# Patient Record
Sex: Male | Born: 1960 | Race: White | Hispanic: No | Marital: Married | Smoking: Never smoker
Health system: Southern US, Community
[De-identification: ages and names within clinical notes are randomized; demographics above are authoritative.]

## PROBLEM LIST (undated history)

## (undated) DIAGNOSIS — N289 Disorder of kidney and ureter, unspecified: Secondary | ICD-10-CM

## (undated) DIAGNOSIS — I519 Heart disease, unspecified: Secondary | ICD-10-CM

## (undated) DIAGNOSIS — I4891 Unspecified atrial fibrillation: Secondary | ICD-10-CM

## (undated) DIAGNOSIS — L0390 Cellulitis, unspecified: Secondary | ICD-10-CM

## (undated) DIAGNOSIS — D649 Anemia, unspecified: Secondary | ICD-10-CM

## (undated) DIAGNOSIS — I5042 Chronic combined systolic (congestive) and diastolic (congestive) heart failure: Secondary | ICD-10-CM

## (undated) DIAGNOSIS — I2510 Atherosclerotic heart disease of native coronary artery without angina pectoris: Secondary | ICD-10-CM

## (undated) DIAGNOSIS — R601 Generalized edema: Secondary | ICD-10-CM

## (undated) DIAGNOSIS — I10 Essential (primary) hypertension: Secondary | ICD-10-CM

## (undated) DIAGNOSIS — E119 Type 2 diabetes mellitus without complications: Secondary | ICD-10-CM

## (undated) HISTORY — PX: FOOT SURGERY: SHX648

## (undated) HISTORY — PX: CATARACT EXTRACTION W/ INTRAOCULAR LENS  IMPLANT, BILATERAL: SHX1307

---

## 2012-03-30 ENCOUNTER — Encounter (HOSPITAL_COMMUNITY): Payer: Self-pay | Admitting: Emergency Medicine

## 2012-03-30 ENCOUNTER — Emergency Department (HOSPITAL_COMMUNITY): Payer: Self-pay

## 2012-03-30 ENCOUNTER — Inpatient Hospital Stay (HOSPITAL_COMMUNITY)
Admission: EM | Admit: 2012-03-30 | Discharge: 2012-04-11 | DRG: 603 | Disposition: A | Discharge status: Home / Self Care | Payer: MEDICAID | Attending: Internal Medicine | Admitting: Internal Medicine

## 2012-03-30 DIAGNOSIS — L03119 Cellulitis of unspecified part of limb: Secondary | ICD-10-CM | POA: Diagnosis present

## 2012-03-30 DIAGNOSIS — E119 Type 2 diabetes mellitus without complications: Secondary | ICD-10-CM | POA: Diagnosis present

## 2012-03-30 DIAGNOSIS — Z79899 Other long term (current) drug therapy: Secondary | ICD-10-CM

## 2012-03-30 DIAGNOSIS — D649 Anemia, unspecified: Secondary | ICD-10-CM

## 2012-03-30 DIAGNOSIS — L02611 Cutaneous abscess of right foot: Secondary | ICD-10-CM

## 2012-03-30 DIAGNOSIS — I10 Essential (primary) hypertension: Secondary | ICD-10-CM | POA: Diagnosis present

## 2012-03-30 DIAGNOSIS — L02619 Cutaneous abscess of unspecified foot: Principal | ICD-10-CM | POA: Diagnosis present

## 2012-03-30 DIAGNOSIS — D72829 Elevated white blood cell count, unspecified: Secondary | ICD-10-CM | POA: Diagnosis present

## 2012-03-30 DIAGNOSIS — A4902 Methicillin resistant Staphylococcus aureus infection, unspecified site: Secondary | ICD-10-CM | POA: Diagnosis present

## 2012-03-30 DIAGNOSIS — D638 Anemia in other chronic diseases classified elsewhere: Secondary | ICD-10-CM

## 2012-03-30 DIAGNOSIS — E669 Obesity, unspecified: Secondary | ICD-10-CM | POA: Diagnosis present

## 2012-03-30 LAB — CBC WITH DIFFERENTIAL/PLATELET
Basophils Absolute: 0 10*3/uL (ref 0.0–0.1)
Eosinophils Absolute: 0.1 10*3/uL (ref 0.0–0.7)
Lymphocytes Relative: 7 % — ABNORMAL LOW (ref 12–46)
Lymphs Abs: 1 10*3/uL (ref 0.7–4.0)
MCH: 27.9 pg (ref 26.0–34.0)
Neutrophils Relative %: 87 % — ABNORMAL HIGH (ref 43–77)
Platelets: 294 10*3/uL (ref 150–400)
RBC: 3.9 MIL/uL — ABNORMAL LOW (ref 4.22–5.81)
RDW: 12.6 % (ref 11.5–15.5)
WBC: 15.1 10*3/uL — ABNORMAL HIGH (ref 4.0–10.5)

## 2012-03-30 LAB — COMPREHENSIVE METABOLIC PANEL WITH GFR
ALT: 18 U/L (ref 0–53)
AST: 11 U/L (ref 0–37)
Albumin: 2.2 g/dL — ABNORMAL LOW (ref 3.5–5.2)
Alkaline Phosphatase: 164 U/L — ABNORMAL HIGH (ref 39–117)
BUN: 13 mg/dL (ref 6–23)
CO2: 23 meq/L (ref 19–32)
Calcium: 9.5 mg/dL (ref 8.4–10.5)
Chloride: 96 meq/L (ref 96–112)
Creatinine, Ser: 0.84 mg/dL (ref 0.50–1.35)
GFR calc Af Amer: >90 mL/min
GFR calc non Af Amer: >90 mL/min
Glucose, Bld: 324 mg/dL — ABNORMAL HIGH (ref 70–99)
Potassium: 3.8 meq/L (ref 3.5–5.1)
Sodium: 133 meq/L — ABNORMAL LOW (ref 135–145)
Total Bilirubin: 0.2 mg/dL — ABNORMAL LOW (ref 0.3–1.2)
Total Protein: 7.8 g/dL (ref 6.0–8.3)

## 2012-03-30 LAB — GLUCOSE, CAPILLARY
Glucose-Capillary: 234 mg/dL — ABNORMAL HIGH (ref 70–99)
Glucose-Capillary: 202 mg/dL — ABNORMAL HIGH (ref 70–99)

## 2012-03-30 LAB — RETICULOCYTES: RBC.: 3.84 MIL/uL — ABNORMAL LOW (ref 4.22–5.81)

## 2012-03-30 MED ORDER — SODIUM CHLORIDE 0.9 % IV SOLN
INTRAVENOUS | Status: DC
  Administered 2012-03-30: 15:00:00 via INTRAVENOUS

## 2012-03-30 MED ORDER — ACETAMINOPHEN 325 MG PO TABS: 650.0000 mg | ORAL_TABLET | Freq: Four times a day (QID) | ORAL | Status: DC | PRN

## 2012-03-30 MED ORDER — PIPERACILLIN-TAZOBACTAM 3.375 G IVPB
3.3750 g | Freq: Three times a day (TID) | INTRAVENOUS | Status: DC
  Administered 2012-03-30 – 2012-04-01 (×5): 3.375 g via INTRAVENOUS
  Filled 2012-03-30 (×6): qty 50

## 2012-03-30 MED ORDER — ACETAMINOPHEN 650 MG RE SUPP: 650.0000 mg | Freq: Four times a day (QID) | RECTAL | Status: DC | PRN

## 2012-03-30 MED ORDER — PIPERACILLIN-TAZOBACTAM 3.375 G IVPB
3.3750 g | Freq: Once | INTRAVENOUS | Status: AC
  Administered 2012-03-30: 3.375 g via INTRAVENOUS
  Filled 2012-03-30: qty 50

## 2012-03-30 MED ORDER — ONDANSETRON HCL 4 MG/2ML IJ SOLN: 4.0000 mg | Freq: Four times a day (QID) | INTRAMUSCULAR | Status: DC | PRN

## 2012-03-30 MED ORDER — ENOXAPARIN SODIUM 40 MG/0.4ML ~~LOC~~ SOLN
40.0000 mg | SUBCUTANEOUS | Status: DC
  Administered 2012-03-30: 40 mg via SUBCUTANEOUS
  Filled 2012-03-30: qty 0.4

## 2012-03-30 MED ORDER — SODIUM CHLORIDE 0.9 % IV SOLN
Freq: Once | INTRAVENOUS | Status: AC
  Administered 2012-03-30: 12:00:00 via INTRAVENOUS

## 2012-03-30 MED ORDER — SODIUM CHLORIDE 0.9 % IV BOLUS (SEPSIS)
500.0000 mL | INTRAVENOUS | Status: AC
  Administered 2012-03-30: 1000 mL via INTRAVENOUS

## 2012-03-30 MED ORDER — INSULIN ASPART 100 UNIT/ML ~~LOC~~ SOLN
4.0000 [IU] | Freq: Once | SUBCUTANEOUS | Status: AC
  Administered 2012-03-30: 4 [IU] via INTRAVENOUS
  Filled 2012-03-30: qty 1

## 2012-03-30 MED ORDER — TETANUS-DIPHTH-ACELL PERTUSSIS 5-2.5-18.5 LF-MCG/0.5 IM SUSP
0.5000 mL | Freq: Once | INTRAMUSCULAR | Status: AC
  Administered 2012-03-30: 0.5 mL via INTRAMUSCULAR
  Filled 2012-03-30: qty 0.5

## 2012-03-30 MED ORDER — INSULIN ASPART 100 UNIT/ML ~~LOC~~ SOLN
0.0000 [IU] | Freq: Three times a day (TID) | SUBCUTANEOUS | Status: DC
  Administered 2012-03-31: 3 [IU] via SUBCUTANEOUS
  Administered 2012-03-31: 8 [IU] via SUBCUTANEOUS
  Administered 2012-03-31: 2 [IU] via SUBCUTANEOUS
  Administered 2012-04-01: 3 [IU] via SUBCUTANEOUS
  Administered 2012-04-01: 5 [IU] via SUBCUTANEOUS
  Administered 2012-04-01 – 2012-04-04 (×7): 3 [IU] via SUBCUTANEOUS
  Administered 2012-04-04: 2 [IU] via SUBCUTANEOUS
  Administered 2012-04-04: 3 [IU] via SUBCUTANEOUS
  Administered 2012-04-05: 2 [IU] via SUBCUTANEOUS
  Administered 2012-04-05: 3 [IU] via SUBCUTANEOUS
  Administered 2012-04-06 – 2012-04-11 (×10): 2 [IU] via SUBCUTANEOUS

## 2012-03-30 MED ORDER — HYDROCODONE-ACETAMINOPHEN 5-325 MG PO TABS: 1.0000 | ORAL_TABLET | ORAL | Status: DC | PRN

## 2012-03-30 MED ORDER — INSULIN ASPART 100 UNIT/ML ~~LOC~~ SOLN
0.0000 [IU] | Freq: Every day | SUBCUTANEOUS | Status: DC
  Administered 2012-03-30 – 2012-04-03 (×2): 2 [IU] via SUBCUTANEOUS

## 2012-03-30 MED ORDER — ONDANSETRON HCL 4 MG PO TABS: 4.0000 mg | ORAL_TABLET | Freq: Four times a day (QID) | ORAL | Status: DC | PRN

## 2012-03-30 MED ORDER — SODIUM CHLORIDE 0.9 % IV BOLUS (SEPSIS): 1000.0000 mL | Freq: Once | INTRAVENOUS | Status: DC

## 2012-03-30 MED ORDER — VANCOMYCIN HCL IN DEXTROSE 1-5 GM/200ML-% IV SOLN
INTRAVENOUS | Status: AC
  Filled 2012-03-30: qty 200

## 2012-03-30 MED ORDER — HYDROCODONE-ACETAMINOPHEN 5-325 MG PO TABS
2.0000 | ORAL_TABLET | Freq: Once | ORAL | Status: AC
  Administered 2012-03-30: 2 via ORAL
  Filled 2012-03-30: qty 2

## 2012-03-30 MED ORDER — METOPROLOL TARTRATE 25 MG PO TABS
25.0000 mg | ORAL_TABLET | Freq: Two times a day (BID) | ORAL | Status: DC
  Administered 2012-03-30 – 2012-03-31 (×3): 25 mg via ORAL
  Filled 2012-03-30 (×3): qty 1

## 2012-03-30 MED ORDER — VANCOMYCIN HCL IN DEXTROSE 1-5 GM/200ML-% IV SOLN
1000.0000 mg | Freq: Once | INTRAVENOUS | Status: AC
  Administered 2012-03-30: 1000 mg via INTRAVENOUS
  Filled 2012-03-30: qty 200

## 2012-03-30 MED ORDER — HYDROCODONE-ACETAMINOPHEN 5-325 MG PO TABS
1.0000 | ORAL_TABLET | ORAL | Status: DC | PRN
  Administered 2012-03-30 – 2012-04-04 (×21): 2 via ORAL
  Administered 2012-04-04 – 2012-04-05 (×3): 1 via ORAL
  Administered 2012-04-05 (×2): 2 via ORAL
  Administered 2012-04-05 – 2012-04-06 (×2): 1 via ORAL
  Administered 2012-04-06 (×2): 2 via ORAL
  Administered 2012-04-07 – 2012-04-08 (×4): 1 via ORAL
  Administered 2012-04-09: 2 via ORAL
  Administered 2012-04-09: 1 via ORAL
  Administered 2012-04-09 – 2012-04-11 (×6): 2 via ORAL
  Filled 2012-03-30 (×2): qty 2
  Filled 2012-03-30: qty 1
  Filled 2012-03-30 (×6): qty 2
  Filled 2012-03-30: qty 1
  Filled 2012-03-30 (×8): qty 2
  Filled 2012-03-30: qty 1
  Filled 2012-03-30: qty 2
  Filled 2012-03-30: qty 1
  Filled 2012-03-30: qty 2
  Filled 2012-03-30: qty 1
  Filled 2012-03-30 (×7): qty 2
  Filled 2012-03-30: qty 1
  Filled 2012-03-30: qty 2
  Filled 2012-03-30: qty 1
  Filled 2012-03-30 (×4): qty 2
  Filled 2012-03-30 (×2): qty 1
  Filled 2012-03-30 (×3): qty 2

## 2012-03-30 MED ORDER — SODIUM CHLORIDE 0.9 % IV SOLN
INTRAVENOUS | Status: DC
  Administered 2012-03-31 – 2012-04-10 (×6): via INTRAVENOUS

## 2012-03-30 MED ORDER — VANCOMYCIN HCL IN DEXTROSE 1-5 GM/200ML-% IV SOLN
1000.0000 mg | Freq: Two times a day (BID) | INTRAVENOUS | Status: DC
  Administered 2012-03-30 – 2012-03-31 (×3): 1000 mg via INTRAVENOUS
  Filled 2012-03-30 (×4): qty 200

## 2012-03-30 MED ORDER — PIPERACILLIN-TAZOBACTAM 3.375 G IVPB
INTRAVENOUS | Status: AC
  Filled 2012-03-30: qty 100

## 2012-03-30 MED ORDER — MORPHINE SULFATE 4 MG/ML IJ SOLN
4.0000 mg | Freq: Once | INTRAMUSCULAR | Status: DC
  Filled 2012-03-30: qty 1

## 2012-03-30 NOTE — ED Notes (Signed)
Pt with wound to bottom of right foot, pt states that started out what looked like a bruise 2 weeks ago, states that never healed and got worse, was seen at Urgent Care the other day and had an I&D done and started on antibiotics

## 2012-03-30 NOTE — ED Notes (Signed)
Patient transported to X-ray 

## 2012-03-30 NOTE — ED Provider Notes (Signed)
History     CSN: [id number]  Arrival date & time 03/30/12  1041   First MD Initiated Contact with Patient 03/30/12 1053      Chief Complaint  Patient presents with  . Wound Infection    (Consider location/radiation/quality/duration/timing/severity/associated sxs/prior treatment) HPI  Patient reports about 2 weeks ago he had some minor discomfort on the sole of his right foot. He denied any known injury stating he even looked at the soles of his shoes to make sure there were no puncture wounds. He states it felt like he had a bruise on the bottom of his foot. About a week ago he started getting swelling of the right foot and the area on his bottom of his foot got bigger. He reports his foot started feeling tight and he was not walking on it. He was seen at an urgent care 2 days ago where he had a temperature of 99 and it was felt he had a abscess on the sole of his foot. It was lanced and they report a lot of drainage occurred. He was started on Septra antibiotic. He relates swelling is improved a little bit since 2 days ago. He was sent to Dr. Brooke Bonito office today for evaluation and he sent to the ER. Dr. Luna Glasgow evidently is leaving town tomorrow and states he could not take care of the patient.  Patient denies known fevers or chills. Patient states he likes to go rafting but does not think he had any injury while rafting.  PCP none  History reviewed. No pertinent past medical history.  Has not seen a doctor in 20 years  History reviewed. No pertinent past surgical history.  History reviewed. No pertinent family history. MOP diabetes in 70's  History  Substance Use Topics  . Smoking status: Never Smoker   . Smokeless tobacco: Not on file  . Alcohol Use: No   lives with spouse Self-employed repairing string instruments   last tetanus unknown  Review of Systems  All other systems reviewed and are negative.    Allergies  Review of patient's allergies indicates no known  allergies.  Home Medications   Current Outpatient Rx  Name Route Sig Dispense Refill  . IBUPROFEN 200 MG PO TABS Oral Take 400 mg by mouth every 4 (four) hours as needed.    . SULFAMETHOXAZOLE-TMP DS 800-160 MG PO TABS Oral Take 2 tablets by mouth 2 (two) times daily.      BP 179/90  Pulse 122  Temp 98 F (36.7 C) (Oral)  Resp 18  Ht 5\' 11"  (1.803 m)  Wt 250 lb (113.399 kg)  BMI 34.87 kg/m2  SpO2 96%  Vital signs normal except tachycardia   Physical Exam  Constitutional: He is oriented to person, place, and time. He appears well-developed and well-nourished.  Non-toxic appearance. He does not appear ill. No distress.  HENT:  Head: Normocephalic and atraumatic.  Right Ear: External ear normal.  Left Ear: External ear normal.  Nose: Nose normal. No mucosal edema or rhinorrhea.  Mouth/Throat: Oropharynx is clear and moist and mucous membranes are normal. No dental abscesses or uvula swelling.  Eyes: Conjunctivae and EOM are normal. Pupils are equal, round, and reactive to light.  Neck: Normal range of motion and full passive range of motion without pain. Neck supple.  Cardiovascular: Regular rhythm and normal heart sounds.  Tachycardia present.  Exam reveals no gallop and no friction rub.   No murmur heard. Pulmonary/Chest: Effort normal and breath sounds normal. No  respiratory distress. He has no wheezes. He has no rhonchi. He has no rales. He exhibits no tenderness and no crepitus.  Abdominal: Soft. Normal appearance and bowel sounds are normal. He exhibits no distension. There is no tenderness. There is no rebound and no guarding.  Musculoskeletal: Normal range of motion. He exhibits no edema and no tenderness.       Right foot: He exhibits swelling.       Moves all extremities well.   Patient has diffuse swelling of the dorsum of his right foot extending to the ankles bilaterally. He has some redness/bruising on the lateral aspect of his foot bilaterally beneath the  malleoli. He's noted to have a an incision on the sole of his right foot just proximal to the MTP area that when I press he has bloody/purulent drainage from. He has a large 5 x 8 purplish swollen area surrounding the incision with a rim of white underneath the skin surrounding it about 3/4 cm in size. The purple area area is bulging.  Neurological: He is alert and oriented to person, place, and time. He has normal strength. No cranial nerve deficit.  Skin: Skin is warm, dry and intact. No rash noted. No erythema. No pallor.  Psychiatric: He has a normal mood and affect. His speech is normal and behavior is normal. His mood appears not anxious.    ED Course  Procedures (including critical care time)   Medications  sodium chloride 0.9 % bolus 1,000 mL (not administered)  morphine 4 MG/ML injection 4 mg (4 mg Intravenous Not Given 03/30/12 1341)  TDaP (BOOSTRIX) injection 0.5 mL (0.5 mL Intramuscular Given 03/30/12 1132)  0.9 %  sodium chloride infusion (  Intravenous Bolus from Bag 03/30/12 1141)  sodium chloride 0.9 % bolus 500 mL (1000 mL Intravenous Given 03/30/12 1130)  vancomycin (VANCOCIN) IVPB 1000 mg/200 mL premix (1000 mg Intravenous Given 03/30/12 1212)  piperacillin-tazobactam (ZOSYN) IVPB 3.375 g (3.375 g Intravenous Given 03/30/12 1140)  HYDROcodone-acetaminophen (NORCO) 5-325 MG per tablet 2 tablet (2 tablet Oral Given 03/30/12 1147)  insulin aspart (novoLOG) injection 4 Units (4 Units Intravenous Given 03/30/12 1332)   Wound culture obtained by me during exam  13:38 Dr Roderic Palau admit to team 2 med-surg  Results for orders placed during the hospital encounter of 03/30/12  CBC WITH DIFFERENTIAL      Component Value Range   WBC 15.1 (*) 4.0 - 10.5 K/uL   RBC 3.90 (*) 4.22 - 5.81 MIL/uL   Hemoglobin 10.9 (*) 13.0 - 17.0 g/dL   HCT 32.4 (*) 39.0 - 52.0 %   MCV 83.1  78.0 - 100.0 fL   MCH 27.9  26.0 - 34.0 pg   MCHC 33.6  30.0 - 36.0 g/dL   RDW 12.6  11.5 - 15.5 %   Platelets 294  150 - 400  K/uL   Neutrophils Relative 87 (*) 43 - 77 %   Neutro Abs 13.1 (*) 1.7 - 7.7 K/uL   Lymphocytes Relative 7 (*) 12 - 46 %   Lymphs Abs 1.0  0.7 - 4.0 K/uL   Monocytes Relative 6  3 - 12 %   Monocytes Absolute 0.9  0.1 - 1.0 K/uL   Eosinophils Relative 0  0 - 5 %   Eosinophils Absolute 0.1  0.0 - 0.7 K/uL   Basophils Relative 0  0 - 1 %   Basophils Absolute 0.0  0.0 - 0.1 K/uL  COMPREHENSIVE METABOLIC PANEL      Component  Value Range   Sodium 133 (*) 135 - 145 mEq/L   Potassium 3.8  3.5 - 5.1 mEq/L   Chloride 96  96 - 112 mEq/L   CO2 23  19 - 32 mEq/L   Glucose, Bld 324 (*) 70 - 99 mg/dL   BUN 13  6 - 23 mg/dL   Creatinine, Ser 0.84  0.50 - 1.35 mg/dL   Calcium 9.5  8.4 - 10.5 mg/dL   Total Protein 7.8  6.0 - 8.3 g/dL   Albumin 2.2 (*) 3.5 - 5.2 g/dL   AST 11  0 - 37 U/L   ALT 18  0 - 53 U/L   Alkaline Phosphatase 164 (*) 39 - 117 U/L   Total Bilirubin 0.2 (*) 0.3 - 1.2 mg/dL   GFR calc non Af Amer >90  >90 mL/min   GFR calc Af Amer >90  >90 mL/min  GLUCOSE, CAPILLARY      Component Value Range   Glucose-Capillary 234 (*) 70 - 99 mg/dL   Laboratory interpretation all normal except leukocytosis, anemia, hyperglycemia    Dg Foot Complete Right  03/30/2012  *RADIOLOGY REPORT*  Clinical Data: Large abscess on this fold of foot.  RIGHT FOOT COMPLETE - 3+ VIEW  Comparison: No priors.  Findings: Three views of the right foot demonstrate extensive soft tissue swelling in the plantar aspect of the foot beneath the calcaneus.  There is also generalized soft tissue swelling.  No retained radiopaque foreign bodies are identified within the soft tissues.  No acute fracture, subluxation or dislocation.  No definite areas of bony lysis are noted.  IMPRESSION: 1.  Soft tissue swelling of the foot as above, without underlying bony abnormality.  Specifically, no definite signs of osteomyelitis, and no retained radiopaque foreign body within the soft tissues.  Original Report Authenticated By: Etheleen Mayhew, M.D.     1. Abscess of right foot   2. Diabetes mellitus   3. Anemia     Plan admission  Rolland Porter, MD, FACEP   MDM          Janice Norrie, MD 03/30/12 [phone]

## 2012-03-30 NOTE — ED Notes (Signed)
Pt states having pain in right foot. Unknown injury or bite. Pt seen at urgent care and given antibiotics 2 days ago. Pt sent to dr Wesley [name] for evaluation and was told to come here for further treatment.

## 2012-03-30 NOTE — Progress Notes (Signed)
Patient has temp of 101.8, Norco just given for pain, paged Dr. [place] Pink to make aware and to see if he would like any new orders at this time.

## 2012-03-30 NOTE — Progress Notes (Signed)
Received report from Eston Esters in ED.

## 2012-03-30 NOTE — H&P (Signed)
PCP:   No primary provider on file.   Chief Complaint:  Cellulitis  HPI: This is a 51 year old gentleman who has not had any medical care in the past 20 years, who presents to the hospital with right foot pain and swelling. Patient reports onset of his symptoms approximately 2 weeks ago. He had felt that he may be developing a bruise on his right heel. This became increasingly painful and patient was unable to bear weight on his foot. He was walking with crutches. His foot became increasingly painful, swollen, erythematous. He presents to the urgent care Center on Monday it was felt he had an abscess in his right heel and then he underwent incision and drainage. Patient was placed on Bactrim with plans for close followup. He was sent to Dr. Brooke Bonito office for followup. After being evaluated in his office, he was sent to the emergency room for further care. In the emergency room was noted that patient had a small incision on the plantar aspect of his right foot near his right heel. This was still draining pus. His entire right foot was swollen and tender. He denies any significant fevers. No nausea, vomiting, diarrhea, shortness of breath, cough. It was felt patient would need IV antibiotics and he was referred for admission.  Allergies:  No Known Allergies   History reviewed. No pertinent past medical history.  History reviewed. No pertinent past surgical history.  Prior to Admission medications   Medication Sig Start Date End Date Taking? Authorizing Provider  ibuprofen (ADVIL,MOTRIN) 200 MG tablet Take 400 mg by mouth every 4 (four) hours as needed.   Yes Historical Provider, MD  sulfamethoxazole-trimethoprim (BACTRIM DS) 800-160 MG per tablet Take 2 tablets by mouth 2 (two) times daily. 03/28/12 04/06/12 Yes Historical Provider, MD    Social History:  reports that he has never smoked. He does not have any smokeless tobacco history on file. He reports that he does not drink alcohol or use  illicit drugs.  History reviewed. No pertinent family history.  Review of Systems: Positives in bold Constitutional: Denies fever, chills, diaphoresis, appetite change and fatigue.  HEENT: Denies photophobia, eye pain, redness, hearing loss, ear pain, congestion, sore throat, rhinorrhea, sneezing, mouth sores, trouble swallowing, neck pain, neck stiffness and tinnitus.   Respiratory: Denies SOB, DOE, cough, chest tightness,  and wheezing.   Cardiovascular: Denies chest pain, palpitations and leg swelling.  Gastrointestinal: Denies nausea, vomiting, abdominal pain, diarrhea, constipation, blood in stool and abdominal distention.  Genitourinary: Denies dysuria, urgency, frequency, hematuria, flank pain and difficulty urinating.  Musculoskeletal: Denies myalgias, back pain, joint swelling, arthralgias and gait problem.  Skin: Denies pallor, rash and wound.  Neurological: Denies dizziness, seizures, syncope, weakness, light-headedness, numbness and headaches.  Hematological: Denies adenopathy. Easy bruising, personal or family bleeding history  Psychiatric/Behavioral: Denies suicidal ideation, mood changes, confusion, nervousness, sleep disturbance and agitation   Physical Exam: Blood pressure 171/96, pulse 111, temperature 99.4 F (37.4 C), temperature source Oral, resp. rate 20, height 5\' 11"  (1.803 m), weight 113.354 kg (249 lb 14.4 oz), SpO2 94.00%. General: Patient is lying in bed, doesn't appear in any acute distress, alert and oriented x3 HEENT: Normocephalic, atraumatic, pupils are equal round reactive to light, mucous membranes are moist Neck: Supple Chest: Clear to auscultation bilaterally Cardiac: S1, S2, tachycardic Abdomen: Soft, obese, nontender, bowel sounds are active Extremities: Just to the right heel there is a small incision that continues to drain purulent/bloody material. It is tender to touch. His entire right  foot is swollen and warm. Neurologic: Grossly intact,  nonfocal Psychiatric: Normal affect  Labs on Admission:  Results for orders placed during the hospital encounter of 03/30/12 (from the past 48 hour(s))  CBC WITH DIFFERENTIAL     Status: Abnormal   Collection Time   03/30/12 11:24 AM      Component Value Range Comment   WBC 15.1 (*) 4.0 - 10.5 K/uL    RBC 3.90 (*) 4.22 - 5.81 MIL/uL    Hemoglobin 10.9 (*) 13.0 - 17.0 g/dL    HCT 32.4 (*) 39.0 - 52.0 %    MCV 83.1  78.0 - 100.0 fL    MCH 27.9  26.0 - 34.0 pg    MCHC 33.6  30.0 - 36.0 g/dL    RDW 12.6  11.5 - 15.5 %    Platelets 294  150 - 400 K/uL    Neutrophils Relative 87 (*) 43 - 77 %    Neutro Abs 13.1 (*) 1.7 - 7.7 K/uL    Lymphocytes Relative 7 (*) 12 - 46 %    Lymphs Abs 1.0  0.7 - 4.0 K/uL    Monocytes Relative 6  3 - 12 %    Monocytes Absolute 0.9  0.1 - 1.0 K/uL    Eosinophils Relative 0  0 - 5 %    Eosinophils Absolute 0.1  0.0 - 0.7 K/uL    Basophils Relative 0  0 - 1 %    Basophils Absolute 0.0  0.0 - 0.1 K/uL   COMPREHENSIVE METABOLIC PANEL     Status: Abnormal   Collection Time   03/30/12 11:24 AM      Component Value Range Comment   Sodium 133 (*) 135 - 145 mEq/L    Potassium 3.8  3.5 - 5.1 mEq/L    Chloride 96  96 - 112 mEq/L    CO2 23  19 - 32 mEq/L    Glucose, Bld 324 (*) 70 - 99 mg/dL    BUN 13  6 - 23 mg/dL    Creatinine, Ser 0.84  0.50 - 1.35 mg/dL    Calcium 9.5  8.4 - 10.5 mg/dL    Total Protein 7.8  6.0 - 8.3 g/dL    Albumin 2.2 (*) 3.5 - 5.2 g/dL    AST 11  0 - 37 U/L    ALT 18  0 - 53 U/L    Alkaline Phosphatase 164 (*) 39 - 117 U/L    Total Bilirubin 0.2 (*) 0.3 - 1.2 mg/dL    GFR calc non Af Amer >90  >90 mL/min    GFR calc Af Amer >90  >90 mL/min   GLUCOSE, CAPILLARY     Status: Abnormal   Collection Time   03/30/12  2:03 PM      Component Value Range Comment   Glucose-Capillary 234 (*) 70 - 99 mg/dL     Radiological Exams on Admission: Dg Foot Complete Right  03/30/2012  *RADIOLOGY REPORT*  Clinical Data: Large abscess on this fold of  foot.  RIGHT FOOT COMPLETE - 3+ VIEW  Comparison: No priors.  Findings: Three views of the right foot demonstrate extensive soft tissue swelling in the plantar aspect of the foot beneath the calcaneus.  There is also generalized soft tissue swelling.  No retained radiopaque foreign bodies are identified within the soft tissues.  No acute fracture, subluxation or dislocation.  No definite areas of bony lysis are noted.  IMPRESSION: 1.  Soft tissue swelling of  the foot as above, without underlying bony abnormality.  Specifically, no definite signs of osteomyelitis, and no retained radiopaque foreign body within the soft tissues.  Original Report Authenticated By: Etheleen Mayhew, M.D.    Assessment/Plan Principal Problem:  *Cellulitis and abscess of foot Active Problems:  Diabetes mellitus  HTN (hypertension)  Obesity  Leukocytosis  Anemia  Plan:  #1 cellulitis and abscess. Patient has a draining wound. His x-ray of his foot does not show any evidence of osteomyelitis. We'll start him on broad spectrum antibiotics. A culture from his wound has already been sent. Hopefully patient will improve with IV antibiotics and local wound care. If his wound fails to improve immediate surgical consultation.  #2. New-onset diabetes. We'll check a hemoglobin A1c. We'll keep him on sliding scale insulin for now and monitor blood sugars  #3. Hypertension. Patient is also tachycardic. This appears to be a regular sinus tachycardia. We will check an EKG to confirm. We will start beta blocker for hypertension as well as tachycardia.  #4. Anemia. We'll check anemia panel. Patient will need his cancer screenings to be done as an outpatient  Further orders per the clinical course  Time Spent on Admission: 33mins  MEMON,JEHANZEB [facility] Pager: [phone] 03/30/2012, 7:25 PM

## 2012-03-31 DIAGNOSIS — D649 Anemia, unspecified: Secondary | ICD-10-CM

## 2012-03-31 LAB — BASIC METABOLIC PANEL
Chloride: 100 mEq/L (ref 96–112)
Creatinine, Ser: 0.88 mg/dL (ref 0.50–1.35)
GFR calc Af Amer: >90 mL/min (ref >90)
Potassium: 4 mEq/L (ref 3.5–5.1)
Sodium: 134 mEq/L — ABNORMAL LOW (ref 135–145)

## 2012-03-31 LAB — GLUCOSE, CAPILLARY
Glucose-Capillary: 182 mg/dL — ABNORMAL HIGH (ref 70–99)
Glucose-Capillary: 278 mg/dL — ABNORMAL HIGH (ref 70–99)
Glucose-Capillary: 126 mg/dL — ABNORMAL HIGH (ref 70–99)
Glucose-Capillary: 184 mg/dL — ABNORMAL HIGH (ref 70–99)

## 2012-03-31 LAB — CBC
MCV: 85.1 fL (ref 78.0–100.0)
Platelets: 274 10*3/uL (ref 150–400)
RDW: 12.8 % (ref 11.5–15.5)
WBC: 14.7 10*3/uL — ABNORMAL HIGH (ref 4.0–10.5)

## 2012-03-31 LAB — IRON AND TIBC: TIBC: 167 ug/dL — ABNORMAL LOW (ref 215–435)

## 2012-03-31 LAB — FERRITIN: Ferritin: 866 ng/mL — ABNORMAL HIGH (ref 22–322)

## 2012-03-31 LAB — BODY FLUID CULTURE

## 2012-03-31 LAB — FOLATE: Folate: 10.4 ng/mL

## 2012-03-31 LAB — TSH: TSH: 2.784 u[IU]/mL (ref 0.350–4.500)

## 2012-03-31 MED ORDER — ENOXAPARIN SODIUM 60 MG/0.6ML ~~LOC~~ SOLN
50.0000 mg | SUBCUTANEOUS | Status: DC
  Administered 2012-03-31 – 2012-04-01 (×2): 50 mg via SUBCUTANEOUS
  Filled 2012-03-31 (×2): qty 0.6

## 2012-03-31 NOTE — Consult Note (Signed)
ANTIBIOTIC CONSULT NOTE - INITIAL  Pharmacy Consult for Vancomycin and Zosyn Indication: cellulitis  No Known Allergies  Patient Measurements: Height: 5\' 11"  (180.3 cm) Weight: 249 lb 14.4 oz (113.354 kg) IBW/kg (Calculated) : 75.3   Vital Signs: Temp: 98.5 F (36.9 C) (07/04 0646) Temp src: Oral (07/04 0646) BP: 153/79 mmHg (07/04 0646) Pulse Rate: 91  (07/04 0646) Intake/Output from previous day: 07/03 0701 - 07/04 0700 In: 2723.2 [P.O.:240; I.V.:2483.2] Out: 500 [Urine:500] Intake/Output from this shift:    Labs:  Basename 03/31/12 0541 03/30/12 1124  WBC 14.7* 15.1*  HGB 9.5* 10.9*  PLT 274 294  LABCREA -- --  CREATININE 0.88 0.84   Estimated Creatinine Clearance: 125.7 ml/min (by C-G formula based on Cr of 0.88). No results found for this basename: VANCOTROUGH:2,VANCOPEAK:2,VANCORANDOM:2,GENTTROUGH:2,GENTPEAK:2,GENTRANDOM:2,TOBRATROUGH:2,TOBRAPEAK:2,TOBRARND:2,AMIKACINPEAK:2,AMIKACINTROU:2,AMIKACIN:2, in the last 72 hours   Microbiology: Recent Results (from the past 720 hour(s))  BODY FLUID CULTURE     Status: Normal   Collection Time   03/30/12 11:46 AM      Component Value Range Status Comment   Specimen Description ABSCESS   Final    Special Requests Normal   Final    Gram Stain     Final    Value: THIS TEST WAS ORDERED IN ERROR AND HAS BEEN CREDITED.   Culture     Final    Value: THIS TEST WAS ORDERED IN ERROR AND HAS BEEN CREDITED.   Report Status 03/31/2012 FINAL   Final   WOUND CULTURE     Status: Normal (Preliminary result)   Collection Time   03/30/12 11:46 AM      Component Value Range Status Comment   Specimen Description WOUND RIGHT FOOT   Final    Special Requests NONE   Final    Gram Stain PENDING   Incomplete    Culture NO GROWTH   Final    Report Status PENDING   Incomplete   CULTURE, BLOOD (ROUTINE X 2)     Status: Normal (Preliminary result)   Collection Time   03/30/12 10:19 PM      Component Value Range Status Comment   Specimen  Description BLOOD RIGHT HAND   Final    Special Requests BOTTLES DRAWN AEROBIC AND ANAEROBIC 6CC   Final    Culture PENDING   Incomplete    Report Status PENDING   Incomplete   CULTURE, BLOOD (ROUTINE X 2)     Status: Normal (Preliminary result)   Collection Time   03/30/12 10:19 PM      Component Value Range Status Comment   Specimen Description BLOOD LEFT HAND   Final    Special Requests BOTTLES DRAWN AEROBIC AND ANAEROBIC 6CC   Final    Culture PENDING   Incomplete    Report Status PENDING   Incomplete    Medical History: History reviewed. No pertinent past medical history.  Medications:  Scheduled:    . sodium chloride   Intravenous Once  . enoxaparin  50 mg Subcutaneous Q24H  . HYDROcodone-acetaminophen  2 tablet Oral Once  . insulin aspart  0-15 Units Subcutaneous TID WC  . insulin aspart  0-5 Units Subcutaneous QHS  . insulin aspart  4 Units Intravenous Once  . metoprolol tartrate  25 mg Oral BID  . piperacillin-tazobactam (ZOSYN)  IV  3.375 g Intravenous Once  . piperacillin-tazobactam (ZOSYN)  IV  3.375 g Intravenous Q8H  . sodium chloride  500 mL Intravenous STAT  . TDaP  0.5 mL Intramuscular Once  .  vancomycin  1,000 mg Intravenous Once  . vancomycin  1,000 mg Intravenous Q12H  . DISCONTD: enoxaparin  40 mg Subcutaneous Q24H  . DISCONTD: morphine  4 mg Intravenous Once  . DISCONTD: sodium chloride  1,000 mL Intravenous Once   Assessment: 50yo obese M with good renal fxn.  Admitted c/o foot pain, swelling, wound draining pus.  Vancomycin and Zosyn started yesterday. Estimated Creatinine Clearance: 125.7 ml/min (by C-G formula based on Cr of 0.88).  Goal of Therapy:  Vancomycin trough level 10-15 mcg/ml Eradicate infection.  Plan: Zosyn 3.375gm iv q8hrs (4hr infusion) Vancomycin 1gm iv q12hrs Check trough tomorrow Labs per protocol  Hart Robinsons A 03/31/2012,7:45 AM

## 2012-03-31 NOTE — Care Management Note (Unsigned)
    Page 1 of 1   04/11/2012     2:04:46 PM   CARE MANAGEMENT NOTE 04/11/2012  Patient:  Wesley [name], Wesley [name]   Account Number:  [id number]  Date Initiated:  03/31/2012  Documentation initiated by:  Claretha Cooper  Subjective/Objective Assessment:   Pt admitted from home with his wife with cellulitis of rt foot. Also Dx'd with new onset DM. States he does not have a PCP. Discussed [facility] and [facility] since he is self employed.     Action/Plan:   Will follow pt and assist with appt at the health department and [facility] needs   Anticipated DC Date:  04/11/2012   Anticipated DC Plan:  HOME/SELF CARE  In-house referral  [facility]  CM consult      Choice offered to / List presented to:             Status of service:  Completed, signed off Medicare Important Message given?   (If response is "NO", the following Medicare IM given date fields will be blank) Date Medicare IM given:   Date Additional Medicare IM given:    Discharge Disposition:  HOME/SELF CARE  Per UR Regulation:    If discussed at Long Length of Stay Meetings, dates discussed:    Comments:  04/11/12 [place] BSN CM Ricky with [employer] and Dr. Romona Curls did dressing change on Saturday am and again today at noon. [employer] call to notify us that the [place] has been approved. DC plan is as follows. IV Vanc does given at 4pm today, wound vac and supplies to be delivered before 5pm. Wife to pick up pt after she gets off work at 59. Per Dr. Romona Curls, pt is to follow up in Bradford's office to do a dressing change on Friday.  04/08/12 [place] BSN CM Ricky with [employer] met with CM and pt. Everything set for tomorrow at 8am. Per Rickie, financial forms received from spouse. Per Dr. Roderic Palau, expecting pt to be DC'd on PO antibiotics.  04/07/12 [place] BSN CM Financial forms for indigent care from [facility] given to pt. Discribed Saturday morning to pt. Unsure if IV or PO antibiotics for  DC. [place] alerted that they may get referral for IV antibiotics at DC.  04/06/12 1500 [address] [place] BSN CM CM worked with Drs. Memon and [place]. Arranged for [employer] rep, Tomasita Morrow to call Dr. Romona Curls and they agreed to meet Saturday am at 8am to assess wound and discuss wound vac. [employer] will do the wound vac for DC on a charity case basis. CM has requested Azucena Freed from PT to have the pulse lavage available a little before 8am for Dr. Romona Curls. Will assess periferal area for IV antibiotic progress and DC plan at that time.  03/31/12 [place] BSN CM

## 2012-03-31 NOTE — Progress Notes (Signed)
Subjective: Foot feels about the same, still swollen and tender.  No new complaints.  Had a fever last night but none since then.  Objective: Vital signs in last 24 hours: Temp:  [98.5 F (36.9 C)-101.8 F (38.8 C)] 98.5 F (36.9 C) (07/04 0646) Pulse Rate:  [91-115] 97  (07/04 0941) Resp:  [18-20] 20  (07/04 0941) BP: (137-171)/(67-96) 137/67 mmHg (07/04 0941) SpO2:  [92 %-96 %] 92 % (07/04 0941) Weight:  [113.354 kg (249 lb 14.4 oz)] 113.354 kg (249 lb 14.4 oz) (07/03 1430) Weight change:  Last BM Date: 03/29/12  Intake/Output from previous day: 07/03 0701 - 07/04 0700 In: 2723.2 [P.O.:240; I.V.:2483.2] Out: 500 [Urine:500]     Physical Exam: General: Alert, awake, oriented x3, in no acute distress. HEENT: No bruits, no goiter. Heart: Regular rate and rhythm, without murmurs, rubs, gallops. Lungs: Clear to auscultation bilaterally. Abdomen: Soft, nontender, nondistended, positive bowel sounds. Extremities: right foot has dressing over it, still appear edematous and warm. Neuro: Grossly intact, nonfocal.    Lab Results: Basic Metabolic Panel:  Basename 03/31/12 0541 03/30/12 1124  NA 134* 133*  K 4.0 3.8  CL 100 96  CO2 24 23  GLUCOSE 203* 324*  BUN 12 13  CREATININE 0.88 0.84  CALCIUM 8.7 9.5  MG -- --  PHOS -- --   Liver Function Tests:  Basename 03/30/12 1124  AST 11  ALT 18  ALKPHOS 164*  BILITOT 0.2*  PROT 7.8  ALBUMIN 2.2*   No results found for this basename: LIPASE:2,AMYLASE:2 in the last 72 hours No results found for this basename: AMMONIA:2 in the last 72 hours CBC:  Basename 03/31/12 0541 03/30/12 1124  WBC 14.7* 15.1*  NEUTROABS -- 13.1*  HGB 9.5* 10.9*  HCT 29.2* 32.4*  MCV 85.1 83.1  PLT 274 294   Cardiac Enzymes: No results found for this basename: CKTOTAL:3,CKMB:3,CKMBINDEX:3,TROPONINI:3 in the last 72 hours BNP: No results found for this basename: PROBNP:3 in the last 72 hours D-Dimer: No results found for this basename:  DDIMER:2 in the last 72 hours CBG:  Basename 03/31/12 0748 03/30/12 2103 03/30/12 1403  GLUCAP 182* 202* 234*   Hemoglobin A1C: No results found for this basename: HGBA1C in the last 72 hours Fasting Lipid Panel: No results found for this basename: CHOL,HDL,LDLCALC,TRIG,CHOLHDL,LDLDIRECT in the last 72 hours Thyroid Function Tests: No results found for this basename: TSH,T4TOTAL,FREET4,T3FREE,THYROIDAB in the last 72 hours Anemia Panel:  Basename 03/30/12 1124  VITAMINB12 --  FOLATE --  FERRITIN --  TIBC --  IRON --  RETICCTPCT 1.4   Coagulation: No results found for this basename: LABPROT:2,INR:2 in the last 72 hours Urine Drug Screen: Drugs of Abuse  No results found for this basename: labopia, cocainscrnur, labbenz, amphetmu, thcu, labbarb    Alcohol Level: No results found for this basename: ETH:2 in the last 72 hours Urinalysis: No results found for this basename: COLORURINE:2,APPERANCEUR:2,LABSPEC:2,PHURINE:2,GLUCOSEU:2,HGBUR:2,BILIRUBINUR:2,KETONESUR:2,PROTEINUR:2,UROBILINOGEN:2,NITRITE:2,LEUKOCYTESUR:2 in the last 72 hours  Recent Results (from the past 240 hour(s))  BODY FLUID CULTURE     Status: Normal   Collection Time   03/30/12 11:46 AM      Component Value Range Status Comment   Specimen Description ABSCESS   Final    Special Requests Normal   Final    Gram Stain     Final    Value: THIS TEST WAS ORDERED IN ERROR AND HAS BEEN CREDITED.   Culture     Final    Value: THIS TEST WAS ORDERED IN ERROR AND  HAS BEEN CREDITED.   Report Status 03/31/2012 FINAL   Final   WOUND CULTURE     Status: Normal (Preliminary result)   Collection Time   03/30/12 11:46 AM      Component Value Range Status Comment   Specimen Description WOUND RIGHT FOOT   Final    Special Requests NONE   Final    Gram Stain PENDING   Incomplete    Culture NO GROWTH   Final    Report Status PENDING   Incomplete   CULTURE, BLOOD (ROUTINE X 2)     Status: Normal (Preliminary result)    Collection Time   03/30/12 10:19 PM      Component Value Range Status Comment   Specimen Description BLOOD RIGHT HAND   Final    Special Requests BOTTLES DRAWN AEROBIC AND ANAEROBIC 6CC   Final    Culture NO GROWTH 1 DAY   Final    Report Status PENDING   Incomplete   CULTURE, BLOOD (ROUTINE X 2)     Status: Normal (Preliminary result)   Collection Time   03/30/12 10:19 PM      Component Value Range Status Comment   Specimen Description BLOOD LEFT HAND   Final    Special Requests BOTTLES DRAWN AEROBIC AND ANAEROBIC 6CC   Final    Culture NO GROWTH 1 DAY   Final    Report Status PENDING   Incomplete     Studies/Results: Dg Foot Complete Right  03/30/2012  *RADIOLOGY REPORT*  Clinical Data: Large abscess on this fold of foot.  RIGHT FOOT COMPLETE - 3+ VIEW  Comparison: No priors.  Findings: Three views of the right foot demonstrate extensive soft tissue swelling in the plantar aspect of the foot beneath the calcaneus.  There is also generalized soft tissue swelling.  No retained radiopaque foreign bodies are identified within the soft tissues.  No acute fracture, subluxation or dislocation.  No definite areas of bony lysis are noted.  IMPRESSION: 1.  Soft tissue swelling of the foot as above, without underlying bony abnormality.  Specifically, no definite signs of osteomyelitis, and no retained radiopaque foreign body within the soft tissues.  Original Report Authenticated By: Etheleen Mayhew, M.D.    Medications: Scheduled Meds:   . enoxaparin  50 mg Subcutaneous Q24H  . insulin aspart  0-15 Units Subcutaneous TID WC  . insulin aspart  0-5 Units Subcutaneous QHS  . insulin aspart  4 Units Intravenous Once  . metoprolol tartrate  25 mg Oral BID  . piperacillin-tazobactam (ZOSYN)  IV  3.375 g Intravenous Once  . piperacillin-tazobactam (ZOSYN)  IV  3.375 g Intravenous Q8H  . vancomycin  1,000 mg Intravenous Once  . vancomycin  1,000 mg Intravenous Q12H  . DISCONTD: enoxaparin  40 mg  Subcutaneous Q24H  . DISCONTD: morphine  4 mg Intravenous Once  . DISCONTD: sodium chloride  1,000 mL Intravenous Once   Continuous Infusions:   . sodium chloride 100 mL/hr at 03/31/12 0918  . DISCONTD: sodium chloride 75 mL/hr at 03/30/12 1512   PRN Meds:.acetaminophen, acetaminophen, HYDROcodone-acetaminophen, ondansetron (ZOFRAN) IV, ondansetron, DISCONTD: HYDROcodone-acetaminophen  Assessment/Plan:  Principal Problem:  *Cellulitis and abscess of foot Active Problems:  Diabetes mellitus  HTN (hypertension)  Obesity  Leukocytosis  Anemia  Plan:  1. Cellulitis/abscess of right foot.  Continue IV antibiotics. Keep foot elevated.  If it fails to improve, may need a surgical consult  2. HTN, started on metoprolol, can titrate up as tolerated  3. Diabetes. On sliding scale insulin.  Fasting sugars are better this morning.  Will likely need to start metformin prior to discharge  4. Leukocytosis.  Likely due to infection, continue to follow   LOS: 1 day   Wesley [name] [facility] Pager: [phone] 03/31/2012, 11:51 AM

## 2012-04-01 DIAGNOSIS — D638 Anemia in other chronic diseases classified elsewhere: Secondary | ICD-10-CM

## 2012-04-01 LAB — CBC
HCT: 29.1 % — ABNORMAL LOW (ref 39.0–52.0)
Hemoglobin: 9.5 g/dL — ABNORMAL LOW (ref 13.0–17.0)
MCH: 27.8 pg (ref 26.0–34.0)
MCHC: 32.6 g/dL (ref 30.0–36.0)

## 2012-04-01 LAB — BASIC METABOLIC PANEL
BUN: 11 mg/dL (ref 6–23)
GFR calc non Af Amer: >90 mL/min (ref >90)
Glucose, Bld: 202 mg/dL — ABNORMAL HIGH (ref 70–99)
Potassium: 3.8 mEq/L (ref 3.5–5.1)

## 2012-04-01 LAB — GLUCOSE, CAPILLARY
Glucose-Capillary: 170 mg/dL — ABNORMAL HIGH (ref 70–99)
Glucose-Capillary: 184 mg/dL — ABNORMAL HIGH (ref 70–99)

## 2012-04-01 LAB — HEMOGLOBIN A1C
Hgb A1c MFr Bld: 13.5 % — ABNORMAL HIGH (ref <5.7)
Mean Plasma Glucose: 341 mg/dL — ABNORMAL HIGH (ref <117)

## 2012-04-01 MED ORDER — PNEUMOCOCCAL VAC POLYVALENT 25 MCG/0.5ML IJ INJ
0.5000 mL | INJECTION | INTRAMUSCULAR | Status: AC
  Filled 2012-04-01: qty 0.5

## 2012-04-01 MED ORDER — VANCOMYCIN HCL 1000 MG IV SOLR
1250.0000 mg | Freq: Two times a day (BID) | INTRAVENOUS | Status: DC
  Administered 2012-04-01 – 2012-04-11 (×20): 1250 mg via INTRAVENOUS
  Filled 2012-04-01 (×22): qty 1250

## 2012-04-01 MED ORDER — METOPROLOL TARTRATE 50 MG PO TABS
50.0000 mg | ORAL_TABLET | Freq: Two times a day (BID) | ORAL | Status: DC
  Administered 2012-04-01 – 2012-04-05 (×9): 50 mg via ORAL
  Filled 2012-04-01 (×8): qty 1

## 2012-04-01 MED ORDER — LIVING WELL WITH DIABETES BOOK
Freq: Once | Status: AC
  Administered 2012-04-01: 11:00:00
  Filled 2012-04-01: qty 1

## 2012-04-01 MED ORDER — LISINOPRIL 10 MG PO TABS
10.0000 mg | ORAL_TABLET | Freq: Every day | ORAL | Status: DC
  Administered 2012-04-01 – 2012-04-02 (×2): 10 mg via ORAL
  Filled 2012-04-01 (×2): qty 1

## 2012-04-01 MED ORDER — VANCOMYCIN HCL IN DEXTROSE 1-5 GM/200ML-% IV SOLN: 1000.0000 mg | Freq: Once | INTRAVENOUS | Status: DC

## 2012-04-01 NOTE — Progress Notes (Signed)
Inpatient Diabetes Program Recommendations  Reason for Visit:  New onset Diabetes....Marland KitchenMarland KitchenHbg A1C 13.5%     Patient is currently without insurance and case management involved.  Therefore, I would recommend starting patient on Metformin 1000 mg BID and Amaryl 4 mg daily (very cheap drugs~ $4).  Have patient follow up with free clinic or health department.      Also ordered patient education including videos, [place] consult notes attached to discharge instructions and dietitian consult.  In addition, referral placed for outpatient diabetes education which is provided free of charge for [facility] residents.   Will continue to monitor.   Courtney Heys PhD, RN Diabetes Coordinator  Office:  [phone] Team Pager:  [phone]

## 2012-04-01 NOTE — Progress Notes (Signed)
Dr. Roderic Palau ordered to leave IV out.

## 2012-04-01 NOTE — Progress Notes (Signed)
Nutrition Education Consult  Nutrition dx:  Nutrition-related knowledge deficit r/t New Onset Diabetes AEB MD/nursing request  Intervention:  Brief education;  Provided.  Goals of nutrition therapy discussed.  Understanding confirmed.  RD contact information provided.  Monitoring:  Knowledge; for questions.  Please consult RD if new questions present.  Pager:  [phone]

## 2012-04-01 NOTE — Consult Note (Signed)
ANTIBIOTIC CONSULT NOTE   Pharmacy Consult for Vancomycin and Zosyn Indication: cellulitis  No Known Allergies  Patient Measurements: Height: 5\' 11"  (180.3 cm) Weight: 249 lb 14.4 oz (113.354 kg) IBW/kg (Calculated) : 75.3   Vital Signs: Temp: 98.5 F (36.9 C) (07/05 0507) Temp src: Oral (07/05 0507) BP: 171/89 mmHg (07/05 0507) Pulse Rate: 109  (07/05 0507) Intake/Output from previous day: 07/04 0701 - 07/05 0700 In: 240 [P.O.:240] Out: -  Intake/Output from this shift: Total I/O In: -  Out: 600 [Urine:600]  Labs:  [facility] 04/01/12 0526 03/31/12 0541 03/30/12 1124  WBC 12.9* 14.7* 15.1*  HGB 9.5* 9.5* 10.9*  PLT 288 274 294  LABCREA -- -- --  CREATININE 0.83 0.88 0.84   Estimated Creatinine Clearance: 133.3 ml/min (by C-G formula based on Cr of 0.83).  Basename 04/01/12 0901  VANCOTROUGH 5.8*  VANCOPEAK --  Jake Michaelis --  GENTTROUGH --  GENTPEAK --  GENTRANDOM --  TOBRATROUGH --  Shelah Lewandowsky --  TOBRARND --  [place] --  AMIKACIN --    Microbiology: Recent Results (from the past 720 hour(s))  BODY FLUID CULTURE     Status: Normal   Collection Time   03/30/12 11:46 AM      Component Value Range Status Comment   Specimen Description ABSCESS   Final    Special Requests Normal   Final    Gram Stain     Final    Value: THIS TEST WAS ORDERED IN ERROR AND HAS BEEN CREDITED.   Culture     Final    Value: THIS TEST WAS ORDERED IN ERROR AND HAS BEEN CREDITED.   Report Status 03/31/2012 FINAL   Final   WOUND CULTURE     Status: Normal (Preliminary result)   Collection Time   03/30/12 11:46 AM      Component Value Range Status Comment   Specimen Description WOUND RIGHT FOOT   Final    Special Requests NONE   Final    Gram Stain     Final    Value: NO WBC SEEN     NO SQUAMOUS EPITHELIAL CELLS SEEN     FEW GRAM POSITIVE COCCI IN PAIRS   Culture     Final    Value: ABUNDANT STAPHYLOCOCCUS AUREUS     Note: RIFAMPIN AND GENTAMICIN SHOULD  NOT BE USED AS SINGLE DRUGS FOR TREATMENT OF STAPH INFECTIONS.   Report Status PENDING   Incomplete   CULTURE, BLOOD (ROUTINE X 2)     Status: Normal (Preliminary result)   Collection Time   03/30/12 10:19 PM      Component Value Range Status Comment   Specimen Description BLOOD RIGHT HAND   Final    Special Requests BOTTLES DRAWN AEROBIC AND ANAEROBIC 6CC   Final    Culture NO GROWTH 2 DAYS   Final    Report Status PENDING   Incomplete   CULTURE, BLOOD (ROUTINE X 2)     Status: Normal (Preliminary result)   Collection Time   03/30/12 10:19 PM      Component Value Range Status Comment   Specimen Description BLOOD LEFT HAND   Final    Special Requests BOTTLES DRAWN AEROBIC AND ANAEROBIC 6CC   Final    Culture NO GROWTH 2 DAYS   Final    Report Status PENDING   Incomplete    Medical History: History reviewed. No pertinent past medical history.  Medications:  Scheduled:     . enoxaparin  50 mg Subcutaneous Q24H  . insulin aspart  0-15 Units Subcutaneous TID WC  . insulin aspart  0-5 Units Subcutaneous QHS  . living well with diabetes book   Does not apply Once  . metoprolol tartrate  50 mg Oral BID  . piperacillin-tazobactam (ZOSYN)  IV  3.375 g Intravenous Q8H  . pneumococcal 23 valent vaccine  0.5 mL Intramuscular Tomorrow-1000  . vancomycin  1,000 mg Intravenous Q12H  . DISCONTD: metoprolol tartrate  25 mg Oral BID   Assessment: 51yo obese M with good renal fxn.  Admitted c/o foot pain, swelling, wound draining pus.  WBC improving, renal fxn stable Trough below goal Estimated Creatinine Clearance: 133.3 ml/min (by C-G formula based on Cr of 0.83).  Goal of Therapy:  Vancomycin trough level 10-15 mcg/ml Eradicate infection.  Plan: Continue Zosyn 3.375gm iv q8hrs (4hr infusion) Increase Vancomycin to 1250mg  iv q12hrs Check trough weekly while on Vancomycin Monitor renal fxn and labs per protocol  Hart Robinsons A 04/01/2012,10:14 AM

## 2012-04-01 NOTE — Progress Notes (Signed)
Subjective: Patient feels that his foot may be doing a little better, still draining, still tender  Objective: Vital signs in last 24 hours: Temp:  [98.5 F (36.9 C)-98.6 F (37 C)] 98.5 F (36.9 C) (07/05 0507) Pulse Rate:  [109-111] 109  (07/05 0507) Resp:  [20] 20  (07/05 0507) BP: (171-180)/(84-89) 171/89 mmHg (07/05 0507) SpO2:  [91 %-95 %] 91 % (07/05 0507) Weight change:  Last BM Date: 03/31/12  Intake/Output from previous day: 07/04 0701 - 07/05 0700 In: 240 [P.O.:240] Out: -  Total I/O In: -  Out: 600 [Urine:600]   Physical Exam: General: Alert, awake, oriented x3, in no acute distress. HEENT: No bruits, no goiter. Heart: tachycardic Lungs: Clear to auscultation bilaterally. Abdomen: Soft, nontender, nondistended, positive bowel sounds. Extremities: Right foot remains edematous.  There is ecchymosis on the plantar aspect of the foot near the heel and around the previous incision done in the ED.  There is bruising around his left ankle.  There is an area on the lateral foot that is draining bloody purulent fluid when pressure applied. Neuro: Grossly intact, nonfocal.    Lab Results: Basic Metabolic Panel:  Basename 04/01/12 0526 03/31/12 0541  NA 135 134*  K 3.8 4.0  CL 99 100  CO2 25 24  GLUCOSE 202* 203*  BUN 11 12  CREATININE 0.83 0.88  CALCIUM 9.0 8.7  MG -- --  PHOS -- --   Liver Function Tests:  Basename 03/30/12 1124  AST 11  ALT 18  ALKPHOS 164*  BILITOT 0.2*  PROT 7.8  ALBUMIN 2.2*   No results found for this basename: LIPASE:2,AMYLASE:2 in the last 72 hours No results found for this basename: AMMONIA:2 in the last 72 hours CBC:  Basename 04/01/12 0526 03/31/12 0541 03/30/12 1124  WBC 12.9* 14.7* --  NEUTROABS -- -- 13.1*  HGB 9.5* 9.5* --  HCT 29.1* 29.2* --  MCV 85.1 85.1 --  PLT 288 274 --   Cardiac Enzymes: No results found for this basename: CKTOTAL:3,CKMB:3,CKMBINDEX:3,TROPONINI:3 in the last 72 hours BNP: No results  found for this basename: PROBNP:3 in the last 72 hours D-Dimer: No results found for this basename: DDIMER:2 in the last 72 hours CBG:  Basename 04/01/12 0724 03/31/12 2112 03/31/12 1702 03/31/12 1158 03/31/12 0748 03/30/12 2103  GLUCAP 177* 184* 126* 278* 182* 202*   Hemoglobin A1C:  Basename 03/30/12 1124  HGBA1C 13.5*   Fasting Lipid Panel: No results found for this basename: CHOL,HDL,LDLCALC,TRIG,CHOLHDL,LDLDIRECT in the last 72 hours Thyroid Function Tests:  Basename 03/31/12 0541  TSH 2.784  T4TOTAL --  FREET4 --  T3FREE --  THYROIDAB --   Anemia Panel:  Basename 03/31/12 0541 03/30/12 1124  VITAMINB12 336 --  FOLATE 10.4 --  FERRITIN 866* --  TIBC 167* --  IRON 12* --  RETICCTPCT -- 1.4   Coagulation: No results found for this basename: LABPROT:2,INR:2 in the last 72 hours Urine Drug Screen: Drugs of Abuse  No results found for this basename: labopia, cocainscrnur, labbenz, amphetmu, thcu, labbarb    Alcohol Level: No results found for this basename: ETH:2 in the last 72 hours Urinalysis: No results found for this basename: COLORURINE:2,APPERANCEUR:2,LABSPEC:2,PHURINE:2,GLUCOSEU:2,HGBUR:2,BILIRUBINUR:2,KETONESUR:2,PROTEINUR:2,UROBILINOGEN:2,NITRITE:2,LEUKOCYTESUR:2 in the last 72 hours  Recent Results (from the past 240 hour(s))  BODY FLUID CULTURE     Status: Normal   Collection Time   03/30/12 11:46 AM      Component Value Range Status Comment   Specimen Description ABSCESS   Final    Special Requests  Normal   Final    Gram Stain     Final    Value: THIS TEST WAS ORDERED IN ERROR AND HAS BEEN CREDITED.   Culture     Final    Value: THIS TEST WAS ORDERED IN ERROR AND HAS BEEN CREDITED.   Report Status 03/31/2012 FINAL   Final   WOUND CULTURE     Status: Normal (Preliminary result)   Collection Time   03/30/12 11:46 AM      Component Value Range Status Comment   Specimen Description WOUND RIGHT FOOT   Final    Special Requests NONE   Final    Gram  Stain     Final    Value: NO WBC SEEN     NO SQUAMOUS EPITHELIAL CELLS SEEN     FEW GRAM POSITIVE COCCI IN PAIRS   Culture     Final    Value: ABUNDANT STAPHYLOCOCCUS AUREUS     Note: RIFAMPIN AND GENTAMICIN SHOULD NOT BE USED AS SINGLE DRUGS FOR TREATMENT OF STAPH INFECTIONS.   Report Status PENDING   Incomplete   CULTURE, BLOOD (ROUTINE X 2)     Status: Normal (Preliminary result)   Collection Time   03/30/12 10:19 PM      Component Value Range Status Comment   Specimen Description BLOOD RIGHT HAND   Final    Special Requests BOTTLES DRAWN AEROBIC AND ANAEROBIC 6CC   Final    Culture NO GROWTH 2 DAYS   Final    Report Status PENDING   Incomplete   CULTURE, BLOOD (ROUTINE X 2)     Status: Normal (Preliminary result)   Collection Time   03/30/12 10:19 PM      Component Value Range Status Comment   Specimen Description BLOOD LEFT HAND   Final    Special Requests BOTTLES DRAWN AEROBIC AND ANAEROBIC 6CC   Final    Culture NO GROWTH 2 DAYS   Final    Report Status PENDING   Incomplete     Studies/Results: Dg Foot Complete Right  03/30/2012  *RADIOLOGY REPORT*  Clinical Data: Large abscess on this fold of foot.  RIGHT FOOT COMPLETE - 3+ VIEW  Comparison: No priors.  Findings: Three views of the right foot demonstrate extensive soft tissue swelling in the plantar aspect of the foot beneath the calcaneus.  There is also generalized soft tissue swelling.  No retained radiopaque foreign bodies are identified within the soft tissues.  No acute fracture, subluxation or dislocation.  No definite areas of bony lysis are noted.  IMPRESSION: 1.  Soft tissue swelling of the foot as above, without underlying bony abnormality.  Specifically, no definite signs of osteomyelitis, and no retained radiopaque foreign body within the soft tissues.  Original Report Authenticated By: Etheleen Mayhew, M.D.    Medications: Scheduled Meds:   . enoxaparin  50 mg Subcutaneous Q24H  . insulin aspart  0-15 Units  Subcutaneous TID WC  . insulin aspart  0-5 Units Subcutaneous QHS  . living well with diabetes book   Does not apply Once  . metoprolol tartrate  50 mg Oral BID  . piperacillin-tazobactam (ZOSYN)  IV  3.375 g Intravenous Q8H  . pneumococcal 23 valent vaccine  0.5 mL Intramuscular Tomorrow-1000  . vancomycin  1,000 mg Intravenous Q12H  . DISCONTD: metoprolol tartrate  25 mg Oral BID   Continuous Infusions:   . sodium chloride 100 mL/hr at 03/31/12 0918   PRN Meds:.acetaminophen, acetaminophen, HYDROcodone-acetaminophen, ondansetron (ZOFRAN)  IV, ondansetron  Assessment/Plan:  Principal Problem:  *Cellulitis and abscess of foot Active Problems:  Diabetes mellitus  HTN (hypertension)  Obesity  Leukocytosis  Anemia of chronic disease  Plan:  1. Cellulitis and abscess of right foot.  There has been mild improvement since admission. Will get surgical opinion whether further incision and drainage is required.  Continue IV antibiotics.  His wound culture shows abundant staph aureus.  I think we can discontinue zosyn and continue with vancomycin.  2. HTN.  Titrate metoprolol, start lisinopril as well which would be beneficial with his history of DM.  3. DM.  Continue sliding scale.  Will start metformin closer to discharge.  HemoglobinA1C of 13.5  4. Leukocytosis, likely due to infection, mild improvement  5. Anemia of chronic disease.  Anemia panel reviewed.  Likely due to diabetes.  Will continue to follow.     LOS: 2 days   MEMON,JEHANZEB [facility] Pager: [phone] 04/01/2012, 10:05 AM

## 2012-04-02 LAB — SURGICAL PCR SCREEN
MRSA, PCR: POSITIVE — AB
Staphylococcus aureus: POSITIVE — AB

## 2012-04-02 LAB — GLUCOSE, CAPILLARY
Glucose-Capillary: 191 mg/dL — ABNORMAL HIGH (ref 70–99)
Glucose-Capillary: 180 mg/dL — ABNORMAL HIGH (ref 70–99)

## 2012-04-02 LAB — WOUND CULTURE: Gram Stain: NONE SEEN

## 2012-04-02 LAB — BASIC METABOLIC PANEL
Chloride: 98 mEq/L (ref 96–112)
Creatinine, Ser: 0.68 mg/dL (ref 0.50–1.35)
GFR calc Af Amer: >90 mL/min (ref >90)
Potassium: 3.6 mEq/L (ref 3.5–5.1)
Sodium: 134 mEq/L — ABNORMAL LOW (ref 135–145)

## 2012-04-02 LAB — CBC
Platelets: 287 10*3/uL (ref 150–400)
RDW: 12.8 % (ref 11.5–15.5)
WBC: 11.1 10*3/uL — ABNORMAL HIGH (ref 4.0–10.5)

## 2012-04-02 MED ORDER — CHLORHEXIDINE GLUCONATE CLOTH 2 % EX PADS
6.0000 | MEDICATED_PAD | Freq: Every day | CUTANEOUS | Status: AC
  Administered 2012-04-02 – 2012-04-05 (×4): 6 via TOPICAL

## 2012-04-02 MED ORDER — MUPIROCIN 2 % EX OINT
1.0000 "application " | TOPICAL_OINTMENT | Freq: Two times a day (BID) | CUTANEOUS | Status: AC
  Administered 2012-04-02 – 2012-04-06 (×10): 1 via NASAL
  Filled 2012-04-02: qty 22

## 2012-04-02 MED ORDER — CHLORHEXIDINE GLUCONATE CLOTH 2 % EX PADS: 6.0000 | MEDICATED_PAD | Freq: Every day | CUTANEOUS | Status: DC

## 2012-04-02 MED ORDER — SODIUM CHLORIDE 0.9 % IJ SOLN
INTRAMUSCULAR | Status: AC
  Administered 2012-04-02: 3 mL
  Filled 2012-04-02: qty 3

## 2012-04-02 MED ORDER — MUPIROCIN 2 % EX OINT: 1.0000 "application " | TOPICAL_OINTMENT | Freq: Two times a day (BID) | CUTANEOUS | Status: DC

## 2012-04-02 MED ORDER — LISINOPRIL 10 MG PO TABS
20.0000 mg | ORAL_TABLET | Freq: Every day | ORAL | Status: DC
  Administered 2012-04-03 – 2012-04-05 (×3): 20 mg via ORAL
  Filled 2012-04-02 (×2): qty 2

## 2012-04-02 MED ORDER — TRAZODONE HCL 50 MG PO TABS
25.0000 mg | ORAL_TABLET | Freq: Every evening | ORAL | Status: DC | PRN
  Administered 2012-04-02 – 2012-04-08 (×2): 25 mg via ORAL
  Filled 2012-04-02 (×2): qty 1

## 2012-04-02 MED ORDER — SODIUM CHLORIDE 0.9 % IJ SOLN
INTRAMUSCULAR | Status: AC
  Administered 2012-04-02: 10 mL
  Filled 2012-04-02: qty 3

## 2012-04-02 MED ORDER — HYDRALAZINE HCL 20 MG/ML IJ SOLN: 10.0000 mg | Freq: Four times a day (QID) | INTRAMUSCULAR | Status: DC | PRN

## 2012-04-02 NOTE — Progress Notes (Signed)
CRITICAL VALUE ALERT  Critical value received:  MRSA Positive Date of notification:  04/02/2012 Time of notification: 2003 Critical value read back:yes  Nurse who received alert:Laquentin Loudermilk  MD notified (1st page): Dr. Maryland Pink Time of first page:2046 MD notified (2nd page):  Time of second page:  Responding MD:  Dr. Maryland Pink Time MD responded: 2048

## 2012-04-02 NOTE — Progress Notes (Signed)
Subjective: Patient reports that he noted increased drainage from foot wound this morning.   Objective: Vital signs in last 24 hours: Temp:  [98.1 F (36.7 C)-99.1 F (37.3 C)] 98.5 F (36.9 C) (07/06 0538) Pulse Rate:  [92-110] 95  (07/06 0538) Resp:  [20] 20  (07/06 0538) BP: (157-187)/(73-92) 187/84 mmHg (07/06 0538) SpO2:  [92 %-94 %] 92 % (07/06 0538) Weight change:  Last BM Date: 03/31/12  Intake/Output from previous day: 07/05 0701 - 07/06 0700 In: 2115 [P.O.:1200; I.V.:415; IV Piggyback:500] Out: 3500 [Urine:3500] Total I/O In: 240 [P.O.:240] Out: -    Physical Exam: General: Alert, awake, oriented x3, in no acute distress. HEENT: No bruits, no goiter. Heart: Regular rate and rhythm, without murmurs, rubs, gallops. Lungs: Clear to auscultation bilaterally. Abdomen: Soft, nontender, nondistended, positive bowel sounds. Extremities: Right foot is still draining and discolored on the plantar side. Neuro: Grossly intact, nonfocal.    Lab Results: Basic Metabolic Panel:  Basename 04/02/12 0635 04/01/12 0526  NA 134* 135  K 3.6 3.8  CL 98 99  CO2 23 25  GLUCOSE 184* 202*  BUN 12 11  CREATININE 0.68 0.83  CALCIUM 8.8 9.0  MG -- --  PHOS -- --   Liver Function Tests:  Basename 03/30/12 1124  AST 11  ALT 18  ALKPHOS 164*  BILITOT 0.2*  PROT 7.8  ALBUMIN 2.2*   No results found for this basename: LIPASE:2,AMYLASE:2 in the last 72 hours No results found for this basename: AMMONIA:2 in the last 72 hours CBC:  Basename 04/02/12 0635 04/01/12 0526 03/30/12 1124  WBC 11.1* 12.9* --  NEUTROABS -- -- 13.1*  HGB 9.5* 9.5* --  HCT 29.4* 29.1* --  MCV 85.0 85.1 --  PLT 287 288 --   Cardiac Enzymes: No results found for this basename: CKTOTAL:3,CKMB:3,CKMBINDEX:3,TROPONINI:3 in the last 72 hours BNP: No results found for this basename: PROBNP:3 in the last 72 hours D-Dimer: No results found for this basename: DDIMER:2 in the last 72  hours CBG:  Basename 04/02/12 0736 04/01/12 2034 04/01/12 1634 04/01/12 1120 04/01/12 0724 03/31/12 2112  GLUCAP 159* 184* 170* 201* 177* 184*   Hemoglobin A1C:  Basename 03/30/12 1124  HGBA1C 13.5*   Fasting Lipid Panel: No results found for this basename: CHOL,HDL,LDLCALC,TRIG,CHOLHDL,LDLDIRECT in the last 72 hours Thyroid Function Tests:  Basename 03/31/12 0541  TSH 2.784  T4TOTAL --  FREET4 --  T3FREE --  THYROIDAB --   Anemia Panel:  Basename 03/31/12 0541 03/30/12 1124  VITAMINB12 336 --  FOLATE 10.4 --  FERRITIN 866* --  TIBC 167* --  IRON 12* --  RETICCTPCT -- 1.4   Coagulation: No results found for this basename: LABPROT:2,INR:2 in the last 72 hours Urine Drug Screen: Drugs of Abuse  No results found for this basename: labopia, cocainscrnur, labbenz, amphetmu, thcu, labbarb    Alcohol Level: No results found for this basename: ETH:2 in the last 72 hours Urinalysis: No results found for this basename: COLORURINE:2,APPERANCEUR:2,LABSPEC:2,PHURINE:2,GLUCOSEU:2,HGBUR:2,BILIRUBINUR:2,KETONESUR:2,PROTEINUR:2,UROBILINOGEN:2,NITRITE:2,LEUKOCYTESUR:2 in the last 72 hours  Recent Results (from the past 240 hour(s))  BODY FLUID CULTURE     Status: Normal   Collection Time   03/30/12 11:46 AM      Component Value Range Status Comment   Specimen Description ABSCESS   Final    Special Requests Normal   Final    Gram Stain     Final    Value: THIS TEST WAS ORDERED IN ERROR AND HAS BEEN CREDITED.   Culture  Final    Value: THIS TEST WAS ORDERED IN ERROR AND HAS BEEN CREDITED.   Report Status 03/31/2012 FINAL   Final   WOUND CULTURE     Status: Normal   Collection Time   03/30/12 11:46 AM      Component Value Range Status Comment   Specimen Description WOUND RIGHT FOOT   Final    Special Requests NONE   Final    Gram Stain     Final    Value: NO WBC SEEN     NO SQUAMOUS EPITHELIAL CELLS SEEN     FEW GRAM POSITIVE COCCI IN PAIRS   Culture     Final    Value:  ABUNDANT METHICILLIN RESISTANT STAPHYLOCOCCUS AUREUS     Note: RIFAMPIN AND GENTAMICIN SHOULD NOT BE USED AS SINGLE DRUGS FOR TREATMENT OF STAPH INFECTIONS. This organism DOES NOT demonstrate inducible Clindamycin resistance in vitro. CRITICAL RESULT CALLED TO, READ BACK BY AND VERIFIED WITH: JESSICA MAYS      04/02/12 1000 BY SMITHERSJ   Report Status 04/02/2012 FINAL   Final    Organism ID, Bacteria METHICILLIN RESISTANT STAPHYLOCOCCUS AUREUS   Final   CULTURE, BLOOD (ROUTINE X 2)     Status: Normal (Preliminary result)   Collection Time   03/30/12 10:19 PM      Component Value Range Status Comment   Specimen Description BLOOD RIGHT HAND   Final    Special Requests BOTTLES DRAWN AEROBIC AND ANAEROBIC 6CC   Final    Culture NO GROWTH 2 DAYS   Final    Report Status PENDING   Incomplete   CULTURE, BLOOD (ROUTINE X 2)     Status: Normal (Preliminary result)   Collection Time   03/30/12 10:19 PM      Component Value Range Status Comment   Specimen Description BLOOD LEFT HAND   Final    Special Requests BOTTLES DRAWN AEROBIC AND ANAEROBIC 6CC   Final    Culture NO GROWTH 2 DAYS   Final    Report Status PENDING   Incomplete     Studies/Results: No results found.  Medications: Scheduled Meds:   . Chlorhexidine Gluconate Cloth  6 each Topical Q0600  . enoxaparin  50 mg Subcutaneous Q24H  . insulin aspart  0-15 Units Subcutaneous TID WC  . insulin aspart  0-5 Units Subcutaneous QHS  . lisinopril  20 mg Oral Daily  . living well with diabetes book   Does not apply Once  . metoprolol tartrate  50 mg Oral BID  . mupirocin ointment  1 application Nasal BID  . pneumococcal 23 valent vaccine  0.5 mL Intramuscular Tomorrow-1000  . sodium chloride      . vancomycin  1,250 mg Intravenous Q12H  . vancomycin  1,000 mg Intravenous Once  . DISCONTD: Chlorhexidine Gluconate Cloth  6 each Topical Daily  . DISCONTD: lisinopril  10 mg Oral Daily  . DISCONTD: mupirocin ointment  1 application Nasal BID    Continuous Infusions:   . sodium chloride 20 mL/hr at 04/01/12 0833   PRN Meds:.acetaminophen, acetaminophen, hydrALAZINE, HYDROcodone-acetaminophen, ondansetron (ZOFRAN) IV, ondansetron  Assessment/Plan:  Principal Problem:  *Cellulitis and abscess of foot Active Problems:  Diabetes mellitus  HTN (hypertension)  Obesity  Leukocytosis  Anemia of chronic disease  1. Cellulitis and abscess of foot. Wound cultures positive for MRSA.  Patient is on vancomycin. Patient was seen today with Dr. Romona Curls.  Plans are for further incision and drainage on 04/03/12.  2. Diabetes.  Newly diagnosed.  Fairly controlled on sliding scale insulin. Will start oral hypoglycemics after surgery  3. HTN.  Uncontrolled.  Will titrate lisinopril up as tolerated.  Use hydralazine prn.  4. Anemia of chronic disease, stable  5. Leukocytosis.  Due to cellulitis/abscess.  Improving with antibiotics.     LOS: 3 days   MEMON,JEHANZEB [facility] Pager: [phone] 04/02/2012, 11:01 AM

## 2012-04-02 NOTE — Progress Notes (Signed)
Patient's surgical swab came back MRSA positive, patient is already on contact.  MD notified.

## 2012-04-02 NOTE — Progress Notes (Signed)
CRITICAL VALUE ALERT  Critical value received:  Positive MRSA wound culture to right foot  Date of notification:  04/02/12  Time of notification:  1000  Critical value read back:yes  Nurse who received alert:  Sharyn Blitz, RN  MD notified (1st page):  Dr. Roderic Palau  Time of first page:  24  MD notified (2nd page):  Time of second page:  Responding MD:  Dr. Roderic Palau  Time MD responded:  1045

## 2012-04-02 NOTE — Progress Notes (Signed)
Pt has viewed diabetic educational videos 502 and 503.

## 2012-04-02 NOTE — Progress Notes (Deleted)
CRITICAL VALUE ALERT  Critical value received:  MRSA Date of notification: 04/02/2012 Time of notification:2003  Critical value read back:yes  Nurse who received alert: A. Lashica Hannay MD notified (1st page):Krishnan Time of first page: 2010 MD notified (2nd page):  Time of second page:  Responding MD:  [place] Pink Time MD responded: 2010

## 2012-04-02 NOTE — Progress Notes (Signed)
Surgery  51 yr old W. Male diagnosed on this hospital admission as a diabetic, has open abscess draining from plantar aspect of Right foot.  He apparently had a problem with his foot over the last 2 weeks and recently had  I&D done at the urgent care center.Subsequently he was admitted via ER and surgery was consulted today.  Pt. Seen and examined, will plan for debridement in OR in AM.  Will hold Lovenox and proceed in AM.  Culture of foot showed MRSA. Cellulitis is resolving on current Vancomycin therapy.  Labs reviewed. BS 189,  WBC 11 down from 15.  Platelets OK H/H 9.5/29.  Pt has good peripheral pulses and he is a nonsmoker and he should hopefully respond to surgical treatment.  Procedure and risks explained to pt. And wife and informed consent obtained. Have discussed with Dr. Roderic Palau and we will co-ordinate care. Filed Vitals:   04/02/12 0538  BP: 187/84  Pulse: 95  Temp: 98.5 F (36.9 C)  Resp: 20  consult dict.# [id number]

## 2012-04-02 NOTE — Progress Notes (Signed)
Pt has reddened area to groin bilaterally. States he believes it is "chafing" from where he has not been out of bed. Dr. Roderic Palau made aware. Area does not appear to be yeast like.  Pt has asked for Vaseline. Will continue to monitor.

## 2012-04-02 NOTE — Consult Note (Signed)
NAME:  KNIGHT, MERKLIN NO.:  [id number]  MEDICAL RECORD NO.:  [id number]  LOCATION:  A316                          FACILITY:  [facility]  PHYSICIAN:  Felicie Morn, M.D. DATE OF BIRTH:  Jun 04, 1961  DATE OF CONSULTATION:  04/02/2012 DATE OF DISCHARGE:                                CONSULTATION   NOTE:  Surgery was asked to see this 51 year old white male for an abscess of the plantar aspect of his right foot.  HISTORY OF PRESENT MEDICAL ILLNESS:  He apparently has had problems over the last 2 weeks with his right foot.  He does not remember stepping on anything or any trauma to his foot at all.  He does walk around barefoot in his home, but he remembers no specific occurrence of any kind involving his right foot.  He however was swollen and he sought attention at an urgent care center a few days ago where an I and D procedure was carried out and the patient was referred for Dr. Luna Glasgow for further followup who then referred him back to the emergency room at which time he was admitted.  Surgery was consulted today with plans for further debridement of his foot.  I have discussed this case with Dr. Roderic Palau and apparently the foot has really responded to the antibiotic therapy with resolution of much of the cellulitis and swelling and clinically he has good peripheral pulses.  He does have an area of drainage and the area of his right heel were just anterior to his right heel which will require further debridement.  Cultures from this have shown methicillin-resistant Staph aureus.  As stated, he is on vancomycin therapy.  Rest of his physical examination apart from being overweight, he is approximately 255 pounds.  He is 5 feet 11 inches.  He has had no real past medical history.  Of course, he does not go to a doctor at all, so there is no real medical history but he has recently been diagnosed on this admission, he is a diabetic.  His current blood sugars are  being controlled by sliding scale and running in the neighborhood of 180 mg/dL.  After examining the patient, we discussed in detail the need for a surgical treatment.  He has been placed on Lovenox which he has had within the last 12 hours or so  and we will hold that and plan for further debridement in the a.m.  He has also eaten at this point so that it makes his surgery a lot safer and I think that we do not lose much ground here by postponing this another 12 hours or so.  We discussed treatment with the patient discussing complications not limited to, but including bleeding, infection, and the possibility of more surgery might be required.  Informed consent was obtained.  We will plan for this in the a.m. and I will coordinate care with Dr. Roderic Palau.     Felicie Morn, M.D.     WB/MEDQ  D:  04/02/2012  T:  04/02/2012  Job:  [id number]  cc:   Kathie Dike, MD

## 2012-04-03 ENCOUNTER — Encounter (HOSPITAL_COMMUNITY): Payer: Self-pay | Admitting: Anesthesiology

## 2012-04-03 ENCOUNTER — Inpatient Hospital Stay (HOSPITAL_COMMUNITY): Payer: Self-pay | Admitting: Anesthesiology

## 2012-04-03 ENCOUNTER — Encounter (HOSPITAL_COMMUNITY)
Admission: EM | Disposition: A | Discharge status: Home / Self Care | Payer: Self-pay | Source: Home / Self Care | Attending: Internal Medicine

## 2012-04-03 HISTORY — PX: INCISION AND DRAINAGE OF WOUND: SHX1803

## 2012-04-03 LAB — CBC
Hemoglobin: 9.4 g/dL — ABNORMAL LOW (ref 13.0–17.0)
MCHC: 32.4 g/dL (ref 30.0–36.0)

## 2012-04-03 LAB — GLUCOSE, CAPILLARY
Glucose-Capillary: 156 mg/dL — ABNORMAL HIGH (ref 70–99)
Glucose-Capillary: 172 mg/dL — ABNORMAL HIGH (ref 70–99)
Glucose-Capillary: 222 mg/dL — ABNORMAL HIGH (ref 70–99)

## 2012-04-03 LAB — BASIC METABOLIC PANEL
GFR calc Af Amer: >90 mL/min (ref >90)
GFR calc non Af Amer: >90 mL/min (ref >90)
Glucose, Bld: 182 mg/dL — ABNORMAL HIGH (ref 70–99)
Potassium: 3.8 mEq/L (ref 3.5–5.1)
Sodium: 137 mEq/L (ref 135–145)

## 2012-04-03 SURGERY — IRRIGATION AND DEBRIDEMENT WOUND
Anesthesia: General | Site: Foot | Laterality: Right | Wound class: Dirty or Infected

## 2012-04-03 MED ORDER — MIDAZOLAM HCL 5 MG/5ML IJ SOLN
INTRAMUSCULAR | Status: DC | PRN
  Administered 2012-04-03 (×2): 2 mg via INTRAVENOUS

## 2012-04-03 MED ORDER — FENTANYL CITRATE 0.05 MG/ML IJ SOLN
INTRAMUSCULAR | Status: AC
  Filled 2012-04-03: qty 2

## 2012-04-03 MED ORDER — MORPHINE SULFATE 4 MG/ML IJ SOLN
INTRAMUSCULAR | Status: AC
  Filled 2012-04-03: qty 1

## 2012-04-03 MED ORDER — MIDAZOLAM HCL 2 MG/2ML IJ SOLN
INTRAMUSCULAR | Status: AC
  Filled 2012-04-03: qty 2

## 2012-04-03 MED ORDER — PROPOFOL 10 MG/ML IV EMUL
INTRAVENOUS | Status: AC
  Filled 2012-04-03: qty 20

## 2012-04-03 MED ORDER — STERILE WATER FOR IRRIGATION IR SOLN
Status: DC | PRN
  Administered 2012-04-03: 2000 mL

## 2012-04-03 MED ORDER — LACTATED RINGERS IV SOLN
INTRAVENOUS | Status: DC | PRN
  Administered 2012-04-03: 10:00:00 via INTRAVENOUS

## 2012-04-03 MED ORDER — DOCUSATE SODIUM 100 MG PO CAPS
100.0000 mg | ORAL_CAPSULE | Freq: Every day | ORAL | Status: DC
  Administered 2012-04-03 – 2012-04-05 (×3): 100 mg via ORAL
  Filled 2012-04-03 (×9): qty 1

## 2012-04-03 MED ORDER — PROPOFOL 10 MG/ML IV BOLUS
INTRAVENOUS | Status: DC | PRN
  Administered 2012-04-03: 160 mg via INTRAVENOUS

## 2012-04-03 MED ORDER — MORPHINE SULFATE 2 MG/ML IJ SOLN
1.0000 mg | INTRAMUSCULAR | Status: DC | PRN
  Administered 2012-04-03 (×3): 1 mg via INTRAVENOUS
  Filled 2012-04-03: qty 1

## 2012-04-03 MED ORDER — SODIUM CHLORIDE 0.9 % IR SOLN
Status: DC | PRN
  Administered 2012-04-03: 3000 mL

## 2012-04-03 MED ORDER — SILVER SULFADIAZINE 1 % EX CREA
TOPICAL_CREAM | CUTANEOUS | Status: AC
  Filled 2012-04-03: qty 50

## 2012-04-03 MED ORDER — LIDOCAINE HCL 1 % IJ SOLN
INTRAMUSCULAR | Status: DC | PRN
  Administered 2012-04-03: 50 mg via INTRADERMAL

## 2012-04-03 MED ORDER — METFORMIN HCL 500 MG PO TABS
1000.0000 mg | ORAL_TABLET | Freq: Two times a day (BID) | ORAL | Status: DC
  Administered 2012-04-03 – 2012-04-11 (×16): 1000 mg via ORAL
  Filled 2012-04-03 (×16): qty 2

## 2012-04-03 MED ORDER — PROPOFOL 10 MG/ML IV EMUL
INTRAVENOUS | Status: AC
  Filled 2012-04-03: qty 40

## 2012-04-03 MED ORDER — MORPHINE SULFATE 4 MG/ML IJ SOLN
4.0000 mg | INTRAMUSCULAR | Status: DC | PRN
  Administered 2012-04-03: 2 mg via INTRAVENOUS
  Administered 2012-04-04: 0.5 mg via INTRAVENOUS
  Administered 2012-04-05 – 2012-04-11 (×4): 4 mg via INTRAVENOUS
  Filled 2012-04-03 (×8): qty 1

## 2012-04-03 MED ORDER — ONDANSETRON HCL 4 MG/2ML IJ SOLN
INTRAMUSCULAR | Status: AC
  Filled 2012-04-03: qty 2

## 2012-04-03 MED ORDER — ALPRAZOLAM 0.5 MG PO TABS
0.5000 mg | ORAL_TABLET | Freq: Three times a day (TID) | ORAL | Status: DC | PRN
  Administered 2012-04-03 – 2012-04-10 (×2): 0.5 mg via ORAL
  Filled 2012-04-03 (×2): qty 1

## 2012-04-03 MED ORDER — ONDANSETRON HCL 4 MG/2ML IJ SOLN
INTRAMUSCULAR | Status: DC | PRN
  Administered 2012-04-03: 4 mg via INTRAVENOUS

## 2012-04-03 MED ORDER — FENTANYL CITRATE 0.05 MG/ML IJ SOLN
INTRAMUSCULAR | Status: DC | PRN
  Administered 2012-04-03: 50 ug via INTRAVENOUS
  Administered 2012-04-03 (×2): 25 ug via INTRAVENOUS
  Administered 2012-04-03 (×2): 50 ug via INTRAVENOUS
  Administered 2012-04-03: 25 ug via INTRAVENOUS
  Administered 2012-04-03: 50 ug via INTRAVENOUS

## 2012-04-03 MED ORDER — 0.9 % SODIUM CHLORIDE (POUR BTL) OPTIME
TOPICAL | Status: DC | PRN
  Administered 2012-04-03: 1000 mL

## 2012-04-03 SURGICAL SUPPLY — 40 items
BAG HAMPER (MISCELLANEOUS) ×3 IMPLANT
BANDAGE GAUZE ELAST BULKY 4 IN (GAUZE/BANDAGES/DRESSINGS) IMPLANT
BLADE SURG SZ10 CARB STEEL (BLADE) ×9 IMPLANT
CLOTH BEACON ORANGE TIMEOUT ST (SAFETY) ×3 IMPLANT
COVER LIGHT HANDLE STERIS (MISCELLANEOUS) ×6 IMPLANT
DEPRESSOR TONGUE BLADE STERILE (MISCELLANEOUS) IMPLANT
DRAPE EXTREMITY T 121X128X90 (DRAPE) ×3 IMPLANT
ELECT REM PT RETURN 9FT ADLT (ELECTROSURGICAL) ×3
ELECTRODE REM PT RTRN 9FT ADLT (ELECTROSURGICAL) ×2 IMPLANT
FORMALIN 10 PREFIL 480ML (MISCELLANEOUS) IMPLANT
GAUZE PACKING IODOFORM 1/2 (PACKING) ×3 IMPLANT
GLOVE BIO SURGEON STRL SZ7.5 (GLOVE) ×3 IMPLANT
GLOVE ECLIPSE 7.0 STRL STRAW (GLOVE) ×3 IMPLANT
GLOVE INDICATOR 7.0 STRL GRN (GLOVE) ×6 IMPLANT
GLOVE INDICATOR 7.5 STRL GRN (GLOVE) ×3 IMPLANT
GLOVE INDICATOR 8.0 STRL GRN (GLOVE) ×3 IMPLANT
GLOVE SKINSENSE NS SZ7.0 (GLOVE) ×1
GLOVE SKINSENSE STRL SZ7.0 (GLOVE) ×2 IMPLANT
GOWN STRL REIN XL XLG (GOWN DISPOSABLE) ×6 IMPLANT
HANDPIECE INTERPULSE COAX TIP (DISPOSABLE) ×1
INST SET MINOR GENERAL (KITS) ×3 IMPLANT
IV NS 1000ML (IV SOLUTION) ×1
IV NS 1000ML BAXH (IV SOLUTION) ×2 IMPLANT
IV NS IRRIG 3000ML ARTHROMATIC (IV SOLUTION) ×3 IMPLANT
KIT ROOM TURNOVER APOR (KITS) ×3 IMPLANT
LUBRICANT JELLY 4.5OZ STERILE (MISCELLANEOUS) IMPLANT
MANIFOLD NEPTUNE II (INSTRUMENTS) ×3 IMPLANT
MARKER SKIN DUAL TIP RULER LAB (MISCELLANEOUS) ×3 IMPLANT
PACK BASIC LIMB (CUSTOM PROCEDURE TRAY) ×3 IMPLANT
PACK MINOR (CUSTOM PROCEDURE TRAY) IMPLANT
PAD ABD 5X9 TENDERSORB (GAUZE/BANDAGES/DRESSINGS) ×6 IMPLANT
PAD ARMBOARD 7.5X6 YLW CONV (MISCELLANEOUS) ×3 IMPLANT
SET BASIN LINEN APH (SET/KITS/TRAYS/PACK) ×3 IMPLANT
SET HNDPC FAN SPRY TIP SCT (DISPOSABLE) ×2 IMPLANT
SOL PREP PROV IODINE SCRUB 4OZ (MISCELLANEOUS) ×3 IMPLANT
SPONGE GAUZE 4X4 12PLY (GAUZE/BANDAGES/DRESSINGS) ×3 IMPLANT
SPONGE LAP 18X18 X RAY DECT (DISPOSABLE) ×6 IMPLANT
SYR BULB IRRIGATION 50ML (SYRINGE) ×3 IMPLANT
TOWEL BLUE STERILE X RAY DET (MISCELLANEOUS) ×3 IMPLANT
WATER STERILE IRR 1000ML POUR (IV SOLUTION) ×9 IMPLANT

## 2012-04-03 NOTE — Op Note (Signed)
NAME:  Wesley [name], Wesley [name] NO.:  [id number]  MEDICAL RECORD NO.:  [id number]  LOCATION:  A316                          FACILITY:  [facility]  PHYSICIAN:  Felicie Morn, M.D. DATE OF BIRTH:  1961/07/12  DATE OF PROCEDURE:  04/03/2012 DATE OF DISCHARGE:                              OPERATIVE REPORT   PREOPERATIVE DIAGNOSIS:  Abscess, plantar aspect of right foot.  POSTOPERATIVE DIAGNOSIS:  Abscess, plantar aspect of right foot.  PROCEDURE:  Debridement, lavage and packing of right foot abscess, plantar aspect.  SURGEON:  Felicie Morn, MD  NOTE:  This is a 51 year old white male, newly diagnosed diabetic nonsmoker who had a 2-week history of an infection in his right foot.  He was seen just prior to his admission to the hospital at the urgent care center where this was I and D'd.  He was admitted to the medical service for cellulitis and then surgery was consulted for an abscess.  He was taken to surgery after antibiotic therapy and removing him from his Lovenox and he was debrided.  Preoperatively, we discussed complications not limited to, but including  bleeding and infection and very real possibility that multiple debridements in the operating room situation may be required.  Informed consent was obtained.  GROSS OPERATIVE FINDINGS:  The patient had an abscess area that appeared to be confined in the instep of his right foot just slightly anterior to his calcaneus and this was deep to the plantar fascia which was opened and extended towards the forefoot.  There was extensive tissue debrided from this area.  No cultures were obtained as he has had already been cultured for methicillin-resistant Staph aureus from this wound and he was already on antibiotic therapy.  TECHNIQUE:  The patient was placed in the supine position after the adequate administration of general anesthesia with LMA.  His foot was prepped with Betadine solution and draped in the usual  manner with a limb isolator.  Incision was carried out over the obvious necrotic tissue in the instep of his right foot removing skin, subcutaneous tissue, and down to the plantar fascia which was debrided and the excision was extended towards the forefoot in order to drain all areas. This appeared to be all viable and appeared to be despite considerable soft tissue gangrene in this area.  This appeared to be confined to this one area in the instep.  This was however quite deep and we irrigated with a pulse lavage with a good 3 L of saline  solution and then we packed the wound with half-inch iodoform gauze soaked in saline solution.  Sterile dressing was applied.  Prior to closure, all sponge, needle, instrument counts were found to be correct.  Estimated blood loss was 50 to 100 mL.  The patient tolerated the procedure well, was taken to recovery room in satisfactory condition.  NOTE:  There will be no change in service.  This patient will be continued on the hospitalist service where his blood sugars will be monitored.  We will also consult physical therapy where we will begin foot lavage, foot whirlpool treatments on him in the morning and then  when this is nice and clean and  he does not need any further debridements,  we will consider using a wound VAC sponge dressing.     Felicie Morn, M.D.     WB/MEDQ  D:  04/03/2012  T:  04/03/2012  Job:  [id number]  cc:   Kathie Dike, MD  Physical Therapy Department

## 2012-04-03 NOTE — Anesthesia Procedure Notes (Signed)
Procedure Name: LMA Insertion Date/Time: 04/03/2012 9:00 AM Performed by: Charmaine Downs Pre-anesthesia Checklist: Patient identified, Emergency Drugs available, Suction available and Patient being monitored Patient Re-evaluated:Patient Re-evaluated prior to inductionOxygen Delivery Method: Circle system utilized Preoxygenation: Pre-oxygenation with 100% oxygen Intubation Type: IV induction Ventilation: Mask ventilation without difficulty LMA: LMA inserted LMA Size: 4.0 Tube secured with: Tape Dental Injury: Teeth and Oropharynx as per pre-operative assessment

## 2012-04-03 NOTE — Anesthesia Preprocedure Evaluation (Signed)
Anesthesia Evaluation  Patient identified by MRN, date of birth, ID band Patient awake    Reviewed: Allergy & Precautions, H&P , NPO status , Patient's Chart, lab work & pertinent test results  History of Anesthesia Complications Negative for: history of anesthetic complications  Airway Mallampati: I TM Distance: >3 FB Neck ROM: Full    Dental  (+) Teeth Intact   Pulmonary neg pulmonary ROS,    Pulmonary exam normal       Cardiovascular Exercise Tolerance: Good hypertension, Rhythm:Regular Rate:Normal     Neuro/Psych    GI/Hepatic   Endo/Other    Renal/GU      Musculoskeletal   Abdominal (+) + obese,   Peds  Hematology   Anesthesia Other Findings   Reproductive/Obstetrics                           Anesthesia Physical Anesthesia Plan  ASA: II  Anesthesia Plan: General   Post-op Pain Management:    Induction: Intravenous  Airway Management Planned: LMA  Additional Equipment:   Intra-op Plan:   Post-operative Plan: Extubation in OR  Informed Consent:   Plan Discussed with: Anesthesiologist  Anesthesia Plan Comments:         Anesthesia Quick Evaluation

## 2012-04-03 NOTE — Progress Notes (Signed)
For surgery this AM.  Glucose 159.  WBC down to 10.  Surgery discussed with family and all questions answered.  Surgical site willl be marked.  Filed Vitals:   04/03/12 0700  BP: 167/74  Pulse: 98  Temp: 98.3 F (36.8 C)  Resp: 20

## 2012-04-03 NOTE — Progress Notes (Signed)
ANTIBIOTIC CONSULT NOTE   Pharmacy Consult for Vancomycin Indication: cellulitis/MRSA Wound  No Known Allergies  Patient Measurements: Height: 5\' 11"  (180.3 cm) Weight: 249 lb 14.4 oz (113.354 kg) IBW/kg (Calculated) : 75.3   Vital Signs: Temp: 98.8 F (37.1 C) (07/07 1008) Temp src: Oral (07/07 0700) BP: 147/68 mmHg (07/07 1008) Pulse Rate: 103  (07/07 1008) Intake/Output from previous day: 07/06 0701 - 07/07 0700 In: 1090 [P.O.:600; I.V.:240; IV Piggyback:250] Out: 600 [Urine:600] Intake/Output from this shift: Total I/O In: 1100 [I.V.:1100] Out: -   Labs:  Basename 04/03/12 0715 04/02/12 0635 04/01/12 0526  WBC 10.0 11.1* 12.9*  HGB 9.4* 9.5* 9.5*  PLT 284 287 288  LABCREA -- -- --  CREATININE 0.71 0.68 0.83   Estimated Creatinine Clearance: 138.3 ml/min (by C-G formula based on Cr of 0.71).  Basename 04/01/12 0901  VANCOTROUGH 5.8*  VANCOPEAK --  Jake Michaelis --  GENTTROUGH --  GENTPEAK --  GENTRANDOM --  TOBRATROUGH --  Shelah Lewandowsky --  TOBRARND --  [place] --  AMIKACIN --    Microbiology: Recent Results (from the past 720 hour(s))  BODY FLUID CULTURE     Status: Normal   Collection Time   03/30/12 11:46 AM      Component Value Range Status Comment   Specimen Description ABSCESS   Final    Special Requests Normal   Final    Gram Stain     Final    Value: THIS TEST WAS ORDERED IN ERROR AND HAS BEEN CREDITED.   Culture     Final    Value: THIS TEST WAS ORDERED IN ERROR AND HAS BEEN CREDITED.   Report Status 03/31/2012 FINAL   Final   WOUND CULTURE     Status: Normal   Collection Time   03/30/12 11:46 AM      Component Value Range Status Comment   Specimen Description WOUND RIGHT FOOT   Final    Special Requests NONE   Final    Gram Stain     Final    Value: NO WBC SEEN     NO SQUAMOUS EPITHELIAL CELLS SEEN     FEW GRAM POSITIVE COCCI IN PAIRS   Culture     Final    Value: ABUNDANT METHICILLIN RESISTANT STAPHYLOCOCCUS AUREUS       Note: RIFAMPIN AND GENTAMICIN SHOULD NOT BE USED AS SINGLE DRUGS FOR TREATMENT OF STAPH INFECTIONS. This organism DOES NOT demonstrate inducible Clindamycin resistance in vitro. CRITICAL RESULT CALLED TO, READ BACK BY AND VERIFIED WITH: JESSICA MAYS      04/02/12 1000 BY SMITHERSJ   Report Status 04/02/2012 FINAL   Final    Organism ID, Bacteria METHICILLIN RESISTANT STAPHYLOCOCCUS AUREUS   Final   CULTURE, BLOOD (ROUTINE X 2)     Status: Normal (Preliminary result)   Collection Time   03/30/12 10:19 PM      Component Value Range Status Comment   Specimen Description BLOOD RIGHT HAND   Final    Special Requests BOTTLES DRAWN AEROBIC AND ANAEROBIC 6CC   Final    Culture NO GROWTH 3 DAYS   Final    Report Status PENDING   Incomplete   CULTURE, BLOOD (ROUTINE X 2)     Status: Normal (Preliminary result)   Collection Time   03/30/12 10:19 PM      Component Value Range Status Comment   Specimen Description BLOOD LEFT HAND   Final    Special Requests BOTTLES DRAWN AEROBIC  AND ANAEROBIC 6CC   Final    Culture NO GROWTH 3 DAYS   Final    Report Status PENDING   Incomplete   SURGICAL PCR SCREEN     Status: Abnormal   Collection Time   04/02/12  4:36 PM      Component Value Range Status Comment   MRSA, PCR POSITIVE (*) NEGATIVE Final    Staphylococcus aureus POSITIVE (*) NEGATIVE Final    Medical History: History reviewed. No pertinent past medical history.  Assessment: 51yo obese M with good renal fxn.  Admitted c/o foot pain, swelling, wound draining pus.  WBC improving, renal fxn stable  Estimated Creatinine Clearance: 138.3 ml/min (by C-G formula based on Cr of 0.71).  S/P DEBRIDEMENT WOUND today in OR.  Goal of Therapy:  Vancomycin trough level 10-15 mcg/ml Eradicate infection.  Plan: Continue Vancomycin to 1250mg  iv q12hrs Check trough in am with + MRSA. Monitor renal fxn and labs per protocol  Pricilla Larsson 04/03/2012,10:31 AM

## 2012-04-03 NOTE — Progress Notes (Signed)
Subjective: Patient is seen in his room post operatively.  He is complaining of significant pain in right foot. No other complaints  Objective: Vital signs in last 24 hours: Temp:  [98 F (36.7 C)-98.8 F (37.1 C)] 98.6 F (37 C) (07/07 1245) Pulse Rate:  [93-113] 93  (07/07 1245) Resp:  [16-20] 16  (07/07 1245) BP: (120-187)/(62-95) 120/62 mmHg (07/07 1245) SpO2:  [91 %-99 %] 94 % (07/07 1245) Weight change:  Last BM Date: 04/03/12  Intake/Output from previous day: 07/06 0701 - 07/07 0700 In: 1090 [P.O.:600; I.V.:240; IV Piggyback:250] Out: 600 [Urine:600] Total I/O In: 1100 [I.V.:1100] Out: 1250 [Urine:1250]   Physical Exam: General: Alert, awake, oriented x3, in no acute distress. HEENT: No bruits, no goiter. Heart: Regular rate and rhythm, without murmurs, rubs, gallops. Lungs: Clear to auscultation bilaterally. Abdomen: Soft, nontender, nondistended, positive bowel sounds. Extremities: right foot has dressing over it, which I did not remove. Neuro: Grossly intact, nonfocal.    Lab Results: Basic Metabolic Panel:  Basename 04/03/12 0715 04/02/12 0635  NA 137 134*  K 3.8 3.6  CL 100 98  CO2 28 23  GLUCOSE 182* 184*  BUN 11 12  CREATININE 0.71 0.68  CALCIUM 8.7 8.8  MG -- --  PHOS -- --   Liver Function Tests: No results found for this basename: AST:2,ALT:2,ALKPHOS:2,BILITOT:2,PROT:2,ALBUMIN:2 in the last 72 hours No results found for this basename: LIPASE:2,AMYLASE:2 in the last 72 hours No results found for this basename: AMMONIA:2 in the last 72 hours CBC:  Basename 04/03/12 0715 04/02/12 0635  WBC 10.0 11.1*  NEUTROABS -- --  HGB 9.4* 9.5*  HCT 29.0* 29.4*  MCV 85.3 85.0  PLT 284 287   Cardiac Enzymes: No results found for this basename: CKTOTAL:3,CKMB:3,CKMBINDEX:3,TROPONINI:3 in the last 72 hours BNP: No results found for this basename: PROBNP:3 in the last 72 hours D-Dimer: No results found for this basename: DDIMER:2 in the last 72  hours CBG:  Basename 04/03/12 1121 04/03/12 1019 04/03/12 0734 04/02/12 2042 04/02/12 1610 04/02/12 1120  GLUCAP 172* 158* 156* 180* 191* 168*   Hemoglobin A1C: No results found for this basename: HGBA1C in the last 72 hours Fasting Lipid Panel: No results found for this basename: CHOL,HDL,LDLCALC,TRIG,CHOLHDL,LDLDIRECT in the last 72 hours Thyroid Function Tests: No results found for this basename: TSH,T4TOTAL,FREET4,T3FREE,THYROIDAB in the last 72 hours Anemia Panel: No results found for this basename: VITAMINB12,FOLATE,FERRITIN,TIBC,IRON,RETICCTPCT in the last 72 hours Coagulation: No results found for this basename: LABPROT:2,INR:2 in the last 72 hours Urine Drug Screen: Drugs of Abuse  No results found for this basename: labopia, cocainscrnur, labbenz, amphetmu, thcu, labbarb    Alcohol Level: No results found for this basename: ETH:2 in the last 72 hours Urinalysis: No results found for this basename: COLORURINE:2,APPERANCEUR:2,LABSPEC:2,PHURINE:2,GLUCOSEU:2,HGBUR:2,BILIRUBINUR:2,KETONESUR:2,PROTEINUR:2,UROBILINOGEN:2,NITRITE:2,LEUKOCYTESUR:2 in the last 72 hours  Recent Results (from the past 240 hour(s))  BODY FLUID CULTURE     Status: Normal   Collection Time   03/30/12 11:46 AM      Component Value Range Status Comment   Specimen Description ABSCESS   Final    Special Requests Normal   Final    Gram Stain     Final    Value: THIS TEST WAS ORDERED IN ERROR AND HAS BEEN CREDITED.   Culture     Final    Value: THIS TEST WAS ORDERED IN ERROR AND HAS BEEN CREDITED.   Report Status 03/31/2012 FINAL   Final   WOUND CULTURE     Status: Normal   Collection  Time   03/30/12 11:46 AM      Component Value Range Status Comment   Specimen Description WOUND RIGHT FOOT   Final    Special Requests NONE   Final    Gram Stain     Final    Value: NO WBC SEEN     NO SQUAMOUS EPITHELIAL CELLS SEEN     FEW GRAM POSITIVE COCCI IN PAIRS   Culture     Final    Value: ABUNDANT METHICILLIN  RESISTANT STAPHYLOCOCCUS AUREUS     Note: RIFAMPIN AND GENTAMICIN SHOULD NOT BE USED AS SINGLE DRUGS FOR TREATMENT OF STAPH INFECTIONS. This organism DOES NOT demonstrate inducible Clindamycin resistance in vitro. CRITICAL RESULT CALLED TO, READ BACK BY AND VERIFIED WITH: JESSICA MAYS      04/02/12 1000 BY [email or web address]   Report Status 04/02/2012 FINAL   Final    Organism ID, Bacteria METHICILLIN RESISTANT STAPHYLOCOCCUS AUREUS   Final   CULTURE, BLOOD (ROUTINE X 2)     Status: Normal (Preliminary result)   Collection Time   03/30/12 10:19 PM      Component Value Range Status Comment   Specimen Description BLOOD RIGHT HAND   Final    Special Requests BOTTLES DRAWN AEROBIC AND ANAEROBIC 6CC   Final    Culture NO GROWTH 4 DAYS   Final    Report Status PENDING   Incomplete   CULTURE, BLOOD (ROUTINE X 2)     Status: Normal (Preliminary result)   Collection Time   03/30/12 10:19 PM      Component Value Range Status Comment   Specimen Description BLOOD LEFT HAND   Final    Special Requests BOTTLES DRAWN AEROBIC AND ANAEROBIC 6CC   Final    Culture NO GROWTH 4 DAYS   Final    Report Status PENDING   Incomplete   SURGICAL PCR SCREEN     Status: Abnormal   Collection Time   04/02/12  4:36 PM      Component Value Range Status Comment   MRSA, PCR POSITIVE (*) NEGATIVE Final    Staphylococcus aureus POSITIVE (*) NEGATIVE Final     Studies/Results: No results found.  Medications: Scheduled Meds:   . Chlorhexidine Gluconate Cloth  6 each Topical Q0600  . docusate sodium  100 mg Oral Daily  . fentaNYL      . fentaNYL      . fentaNYL      . insulin aspart  0-15 Units Subcutaneous TID WC  . insulin aspart  0-5 Units Subcutaneous QHS  . lisinopril  20 mg Oral Daily  . metoprolol tartrate  50 mg Oral BID  . midazolam      . midazolam      . morphine      . morphine      . mupirocin ointment  1 application Nasal BID  . ondansetron      . pneumococcal 23 valent vaccine  0.5 mL Intramuscular  Tomorrow-1000  . propofol      . propofol      . sodium chloride      . vancomycin  1,250 mg Intravenous Q12H  . DISCONTD: enoxaparin  50 mg Subcutaneous Q24H  . DISCONTD: vancomycin  1,000 mg Intravenous Once   Continuous Infusions:   . sodium chloride 20 mL/hr at 04/03/12 1134   PRN Meds:.acetaminophen, acetaminophen, ALPRAZolam, hydrALAZINE, HYDROcodone-acetaminophen, morphine injection, ondansetron (ZOFRAN) IV, ondansetron, traZODone, DISCONTD: 0.9 % irrigation (POUR BTL), DISCONTD:  morphine injection, DISCONTD: sodium  chloride irrigation, DISCONTD: sterile water for irrigation  Assessment/Plan:  Principal Problem:  *Cellulitis and abscess of foot Active Problems:  Diabetes mellitus  HTN (hypertension)  Obesity  Leukocytosis  Anemia of chronic disease  Plan:  1. MRSA cellulitis and abscess of right foot.  Patient has been on IV antibiotics since admission.  He underwent I and D by Dr. Romona Curls earlier today.  Physical therapy for hydrotherapy has been ordered.  2. Newly Diagnosed diabetes.  Blood sugars improving since infection improving.  Will start on metformin.  Hgb a1c is 13.  3. HTN.  On metoprolol and lisinopril.  These will need to be further titrated.  4. Anemia of chronic disease.   Likely due to diabetes and underlying infection.  Stable, continue to follow  5. Leukocytosis.  Resolved  6. Obesity   LOS: 4 days   Danaiya Steadman [facility] Pager: [phone] 04/03/2012, 3:00 PM

## 2012-04-03 NOTE — Anesthesia Postprocedure Evaluation (Signed)
  Anesthesia Post-op Note  Patient: Wesley [name]  Procedure(s) Performed: Procedure(s) (LRB): DEBRIDEMENT WOUND (Right)  Patient Location: PACU  Anesthesia Type: General  Level of Consciousness: awake, alert , oriented and patient cooperative  Airway and Oxygen Therapy: Patient Spontanous Breathing  Post-op Pain: 5 /10, moderate  Post-op Assessment: Post-op Vital signs reviewed, Patient's Cardiovascular Status Stable, Respiratory Function Stable, Patent Airway, No signs of Nausea or vomiting and Pain level controlled  Post-op Vital Signs: Reviewed and stable  Complications: No apparent anesthesia complications

## 2012-04-03 NOTE — Progress Notes (Signed)
POST OP CHECK  awake and alert pain controlled with present regimen.  Dressings dry and intact.   Filed Vitals:   04/03/12 1345  BP: 121/69  Pulse: 91  Temp: 98.3 F (36.8 C)  Resp: 12    Glucose 169.  Will begin whirlpool in AM.

## 2012-04-03 NOTE — Brief Op Note (Signed)
03/30/2012 - 04/03/2012  10:21 AM  PATIENT:  Wesley [name]  51 y.o. male  PRE-OPERATIVE DIAGNOSIS:  wound infection  POST-OPERATIVE DIAGNOSIS:  wound infection  PROCEDURE:  Procedure(s) (LRB): DEBRIDEMENT WOUND (Right)  SURGEON:  Surgeon(s) and Role:    * Scherry Ran, MD - Primary  PHYSICIAN ASSISTANT:   ASSISTANTS: none   ANESTHESIA:   general  EBL:  Total I/O In: 1100 [I.V.:1100] Out: -   BLOOD ADMINISTERED:none  DRAINS: none   LOCAL MEDICATIONS USED:  NONE  SPECIMEN:  No Specimen  DISPOSITION OF SPECIMEN:  N/A  COUNTS:  YES  TOURNIQUET:  * No tourniquets in log *  DICTATION: .Other Dictation: Dictation Number OR Dict. # [id number].  PLAN OF CARE: Admit to inpatient   PATIENT DISPOSITION:  PACU - hemodynamically stable.   Delay start of Pharmacological VTE agent (>24hrs) due to surgical blood loss or risk of bleeding: not applicable

## 2012-04-03 NOTE — Transfer of Care (Signed)
Immediate Anesthesia Transfer of Care Note  Patient: Wesley [name]  Procedure(s) Performed: Procedure(s) (LRB): DEBRIDEMENT WOUND (Right)  Patient Location: PACU  Anesthesia Type: General  Level of Consciousness: awake and patient cooperative  Airway & Oxygen Therapy: Patient Spontanous Breathing and Patient connected to face mask oxygen  Post-op Assessment: Report given to PACU RN, Post -op Vital signs reviewed and stable and Patient moving all extremities  Post vital signs: Reviewed and stable  Complications: No apparent anesthesia complications

## 2012-04-04 ENCOUNTER — Telehealth (HOSPITAL_COMMUNITY): Payer: Self-pay | Admitting: Dietician

## 2012-04-04 LAB — GLUCOSE, CAPILLARY
Glucose-Capillary: 140 mg/dL — ABNORMAL HIGH (ref 70–99)
Glucose-Capillary: 154 mg/dL — ABNORMAL HIGH (ref 70–99)
Glucose-Capillary: 150 mg/dL — ABNORMAL HIGH (ref 70–99)

## 2012-04-04 LAB — CBC
Hemoglobin: 8.7 g/dL — ABNORMAL LOW (ref 13.0–17.0)
MCH: 27.7 pg (ref 26.0–34.0)
MCHC: 31.9 g/dL (ref 30.0–36.0)
MCV: 86.9 fL (ref 78.0–100.0)
RBC: 3.14 MIL/uL — ABNORMAL LOW (ref 4.22–5.81)

## 2012-04-04 LAB — CULTURE, BLOOD (ROUTINE X 2)

## 2012-04-04 LAB — BASIC METABOLIC PANEL
CO2: 28 mEq/L (ref 19–32)
Chloride: 100 mEq/L (ref 96–112)
Creatinine, Ser: 0.95 mg/dL (ref 0.50–1.35)
Potassium: 4 mEq/L (ref 3.5–5.1)

## 2012-04-04 LAB — VANCOMYCIN, TROUGH: Vancomycin Tr: 10.7 ug/mL (ref 10.0–20.0)

## 2012-04-04 NOTE — Telephone Encounter (Signed)
Received consult from diabetes coordinator for dx: new onset diabetes.  Noted that pt is current and inpatient at [facility]. Will follow-up to schedule appointment after discharge.

## 2012-04-04 NOTE — Anesthesia Postprocedure Evaluation (Signed)
  Anesthesia Post-op Note  Patient: Wesley [name]  Procedure(s) Performed: Procedure(s) (LRB): IRRIGATION AND DEBRIDEMENT WOUND (Right)  Patient Location: 316 A  Anesthesia Type: General  Level of Consciousness: awake, alert , oriented and patient cooperative  Airway and Oxygen Therapy: Patient Spontanous Breathing  Post-op Pain: none  Post-op Assessment: Post-op Vital signs reviewed, Patient's Cardiovascular Status Stable, Respiratory Function Stable, Patent Airway, No signs of Nausea or vomiting and Pain level controlled  Post-op Vital Signs: Reviewed and stable  Complications: No apparent anesthesia complications

## 2012-04-04 NOTE — Progress Notes (Signed)
Pt was seen with Dr. Romona Curls this AM for initiation of PL to wound.  He is mobile with crutches and ambulating well.  No formal mobility training should be necessary.  We have discussed the fact that he should not be placing any significant weight on his R foot unless the MD otherwise directs.

## 2012-04-04 NOTE — Progress Notes (Signed)
Occupational Therapy Screen  OT order received. Patient's chart reviewed. Spoke with PT, Rosemarie Ax who informed OT that patient does not require OT services at this time. Patient's primary problem is mobility which will be addressed with PT. Will sign off OT services.  Ailene Ravel, OTR/L

## 2012-04-04 NOTE — Progress Notes (Signed)
Subjective: This man is feeling better. His right foot is less painful. He wonders how he should mobilize now. He is postoperative from incision and drainage.           Physical Exam: Blood pressure 138/75, pulse 94, temperature 98.2 F (36.8 C), temperature source Oral, resp. rate 18, height 5\' 11"  (1.803 m), weight 113.354 kg (249 lb 14.4 oz), SpO2 93.00%. He looks systemically well. Heart sounds are present and normal. Lung fields are clear. He is alert and orientated. The right foot is bandaged up and I did not remove the dressing today. This is been seen by surgeon.   Investigations:  Recent Results (from the past 240 hour(s))  BODY FLUID CULTURE     Status: Normal   Collection Time   03/30/12 11:46 AM      Component Value Range Status Comment   Specimen Description ABSCESS   Final    Special Requests Normal   Final    Gram Stain     Final    Value: THIS TEST WAS ORDERED IN ERROR AND HAS BEEN CREDITED.   Culture     Final    Value: THIS TEST WAS ORDERED IN ERROR AND HAS BEEN CREDITED.   Report Status 03/31/2012 FINAL   Final   WOUND CULTURE     Status: Normal   Collection Time   03/30/12 11:46 AM      Component Value Range Status Comment   Specimen Description WOUND RIGHT FOOT   Final    Special Requests NONE   Final    Gram Stain     Final    Value: NO WBC SEEN     NO SQUAMOUS EPITHELIAL CELLS SEEN     FEW GRAM POSITIVE COCCI IN PAIRS   Culture     Final    Value: ABUNDANT METHICILLIN RESISTANT STAPHYLOCOCCUS AUREUS     Note: RIFAMPIN AND GENTAMICIN SHOULD NOT BE USED AS SINGLE DRUGS FOR TREATMENT OF STAPH INFECTIONS. This organism DOES NOT demonstrate inducible Clindamycin resistance in vitro. CRITICAL RESULT CALLED TO, READ BACK BY AND VERIFIED WITH: JESSICA MAYS      04/02/12 1000 BY [email or web address]   Report Status 04/02/2012 FINAL   Final    Organism ID, Bacteria METHICILLIN RESISTANT STAPHYLOCOCCUS AUREUS   Final   CULTURE, BLOOD (ROUTINE X 2)     Status: Normal   Collection Time   03/30/12 10:19 PM      Component Value Range Status Comment   Specimen Description BLOOD RIGHT HAND   Final    Special Requests BOTTLES DRAWN AEROBIC AND ANAEROBIC 6CC   Final    Culture NO GROWTH 5 DAYS   Final    Report Status 04/04/2012 FINAL   Final   CULTURE, BLOOD (ROUTINE X 2)     Status: Normal   Collection Time   03/30/12 10:19 PM      Component Value Range Status Comment   Specimen Description BLOOD LEFT HAND   Final    Special Requests BOTTLES DRAWN AEROBIC AND ANAEROBIC 6CC   Final    Culture NO GROWTH 5 DAYS   Final    Report Status 04/04/2012 FINAL   Final   SURGICAL PCR SCREEN     Status: Abnormal   Collection Time   04/02/12  4:36 PM      Component Value Range Status Comment   MRSA, PCR POSITIVE (*) NEGATIVE Final    Staphylococcus aureus POSITIVE (*) NEGATIVE Final  Basic Metabolic Panel:  Basename 04/04/12 0512 04/03/12 0715  NA 135 137  K 4.0 3.8  CL 100 100  CO2 28 28  GLUCOSE 187* 182*  BUN 12 11  CREATININE 0.95 0.71  CALCIUM 8.5 8.7  MG -- --  PHOS -- --       CBC:  Basename 04/04/12 0512 04/03/12 0715  WBC 12.7* 10.0  NEUTROABS -- --  HGB 8.7* 9.4*  HCT 27.3* 29.0*  MCV 86.9 85.3  PLT 318 284        Medications:  Scheduled:   . Chlorhexidine Gluconate Cloth  6 each Topical Q0600  . docusate sodium  100 mg Oral Daily  . insulin aspart  0-15 Units Subcutaneous TID WC  . insulin aspart  0-5 Units Subcutaneous QHS  . lisinopril  20 mg Oral Daily  . metFORMIN  1,000 mg Oral BID WC  . metoprolol tartrate  50 mg Oral BID  . mupirocin ointment  1 application Nasal BID  . vancomycin  1,250 mg Intravenous Q12H    Impression: 1. Cellulitis of the right foot, status post incision and drainage. 2. Hypertension, now controlled. 3. Newly diagnosed type 2 diabetes mellitus, on oral hypoglycemic agents, better control. 4. Obesity. 5. Anemia of chronic disease.     Plan: 1. Continue with current therapy. 2.  Occupational and physical therapy evaluation. He will need to continue to be active. 2. Discharge home when okay with surgery.     LOS: 5 days   Doree Albee Pager [phone]  04/04/2012, 1:08 PM

## 2012-04-04 NOTE — Progress Notes (Signed)
POD#1  Pt seen earlier and not written into a different pt record.  Wound was redressed and pulse lavaged and repacked with saline soaked roll gauze.  There was absolutely no drainage so we will proceed with wound vac in AM.  Order has been written.  Pain is controlled with present regimen and I have instructed the nursing staff to give IV morphine about 30 min. Prior to dressing change. Labs reviewed. Glucose controlled by sliding scale. Filed Vitals:   04/04/12 0200  BP: 138/75  Pulse: 94  Temp: 98.2 F (36.8 C)  Resp: 18

## 2012-04-04 NOTE — Progress Notes (Signed)
ANTIBIOTIC CONSULT NOTE   Pharmacy Consult for Vancomycin Indication: cellulitis/MRSA Wound  No Known Allergies  Patient Measurements: Height: 5\' 11"  (180.3 cm) Weight: 249 lb 14.4 oz (113.354 kg) IBW/kg (Calculated) : 75.3   Vital Signs: Temp: 98.2 F (36.8 C) (07/08 0200) Temp src: Oral (07/08 0200) BP: 138/75 mmHg (07/08 0200) Pulse Rate: 94  (07/08 0200) Intake/Output from previous day: 07/07 0701 - 07/08 0700 In: 1948.3 [P.O.:480; I.V.:1218.3; IV Piggyback:250] Out: 1900 [Urine:1900] Intake/Output from this shift:    Labs:  Basename 04/04/12 0512 04/03/12 0715 04/02/12 0635  WBC 12.7* 10.0 11.1*  HGB 8.7* 9.4* 9.5*  PLT 318 284 287  LABCREA -- -- --  CREATININE 0.95 0.71 0.68   Estimated Creatinine Clearance: 116.4 ml/min (by C-G formula based on Cr of 0.95).  Basename 04/04/12 0511 04/01/12 0901  VANCOTROUGH 10.7 5.8*  VANCOPEAK -- --  Wesley [name] -- --  GENTTROUGH -- --  GENTPEAK -- --  GENTRANDOM -- --  TOBRATROUGH -- --  TOBRAPEAK -- --  TOBRARND -- --  AMIKACINPEAK -- --  AMIKACINTROU -- --  AMIKACIN -- --    Microbiology: Recent Results (from the past 720 hour(s))  BODY FLUID CULTURE     Status: Normal   Collection Time   03/30/12 11:46 AM      Component Value Range Status Comment   Specimen Description ABSCESS   Final    Special Requests Normal   Final    Gram Stain     Final    Value: THIS TEST WAS ORDERED IN ERROR AND HAS BEEN CREDITED.   Culture     Final    Value: THIS TEST WAS ORDERED IN ERROR AND HAS BEEN CREDITED.   Report Status 03/31/2012 FINAL   Final   WOUND CULTURE     Status: Normal   Collection Time   03/30/12 11:46 AM      Component Value Range Status Comment   Specimen Description WOUND RIGHT FOOT   Final    Special Requests NONE   Final    Gram Stain     Final    Value: NO WBC SEEN     NO SQUAMOUS EPITHELIAL CELLS SEEN     FEW GRAM POSITIVE COCCI IN PAIRS   Culture     Final    Value: ABUNDANT METHICILLIN RESISTANT  STAPHYLOCOCCUS AUREUS     Note: RIFAMPIN AND GENTAMICIN SHOULD NOT BE USED AS SINGLE DRUGS FOR TREATMENT OF STAPH INFECTIONS. This organism DOES NOT demonstrate inducible Clindamycin resistance in vitro. CRITICAL RESULT CALLED TO, READ BACK BY AND VERIFIED WITH: JESSICA MAYS      04/02/12 1000 BY SMITHERSJ   Report Status 04/02/2012 FINAL   Final    Organism ID, Bacteria METHICILLIN RESISTANT STAPHYLOCOCCUS AUREUS   Final   CULTURE, BLOOD (ROUTINE X 2)     Status: Normal (Preliminary result)   Collection Time   03/30/12 10:19 PM      Component Value Range Status Comment   Specimen Description BLOOD RIGHT HAND   Final    Special Requests BOTTLES DRAWN AEROBIC AND ANAEROBIC 6CC   Final    Culture NO GROWTH 4 DAYS   Final    Report Status PENDING   Incomplete   CULTURE, BLOOD (ROUTINE X 2)     Status: Normal (Preliminary result)   Collection Time   03/30/12 10:19 PM      Component Value Range Status Comment   Specimen Description BLOOD LEFT HAND   Final  Special Requests BOTTLES DRAWN AEROBIC AND ANAEROBIC 6CC   Final    Culture NO GROWTH 4 DAYS   Final    Report Status PENDING   Incomplete   SURGICAL PCR SCREEN     Status: Abnormal   Collection Time   04/02/12  4:36 PM      Component Value Range Status Comment   MRSA, PCR POSITIVE (*) NEGATIVE Final    Staphylococcus aureus POSITIVE (*) NEGATIVE Final    Assessment: Vancomycin trough @ Goal.  Estimated Creatinine Clearance: 116.4 ml/min (by C-G formula based on Cr of 0.95).  S/P DEBRIDEMENT WOUND 04/03/2012 in OR.  Goal of Therapy:  Vancomycin trough level 10-15 mcg/ml Eradicate infection.  Plan: Continue Vancomycin to 1250mg  iv q12hrs Monitor renal fxn and labs per protocol  Pricilla Larsson 04/04/2012,8:53 AM

## 2012-04-04 NOTE — Progress Notes (Signed)
Filed Vitals:   04/04/12 1613  BP: 188/83  Pulse: 99  Temp: 99.2 F (37.3 C)  Resp: 18   No acute changes.  Dressings clean and dry min. Discomfort at present. glucose 140's.  Will plan for wound vac in AM.

## 2012-04-04 NOTE — Addendum Note (Signed)
Addendum  created 04/04/12 1115 by Charmaine Downs, CRNA   Modules edited:Notes Section

## 2012-04-04 NOTE — Progress Notes (Signed)
Inpatient Diabetes Program Recommendations  AACE/ADA: New Consensus Statement on Inpatient Glycemic Control  Target Ranges:  Prepandial:   less than 140 mg/dL      Peak postprandial:   less than 180 mg/dL (1-2 hours)      Critically ill patients:  140 - 180 mg/dL  Pager:  [phone] Hours:  8 am-10pm   Reason for Visit: New diagnosed diabetes with elevated prandial glucose  Inpatient Diabetes Program Recommendations Oral Agents: Add Amaryl 4 mg daily   Courtney Heys PhD, RN Diabetes Coordinator  Office:  [phone] Team Pager:  [phone]

## 2012-04-04 NOTE — Progress Notes (Signed)
Physical Therapy Evaluation Patient Details Name: Wesley [name] MRN: [id number] DOB: June 27, 1961 Today's Date: 04/04/2012 Time: [id number] PT Time Calculation (min): 72 min  PT Assessment / Plan / Recommendation Clinical Impression       PT Assessment       Follow Up Recommendations       Barriers to Discharge        Equipment Recommendations       Recommendations for Other Services     Frequency      Precautions / Restrictions     Pertinent Vitals/Pain       Mobility       Exercises     PT Diagnosis:    PT Problem List:   PT Treatment Interventions:     PT Goals    Visit Information  Last PT Received On: 04/04/12    Subjective Data      Prior Functioning       Cognition       Extremity/Trunk Assessment     Balance    End of Session    GP     Sable Feil 04/04/2012, 4:49 PM

## 2012-04-05 LAB — GLUCOSE, CAPILLARY: Glucose-Capillary: 166 mg/dL — ABNORMAL HIGH (ref 70–99)

## 2012-04-05 MED ORDER — METOPROLOL TARTRATE 50 MG PO TABS
100.0000 mg | ORAL_TABLET | Freq: Two times a day (BID) | ORAL | Status: DC
  Administered 2012-04-05 – 2012-04-11 (×12): 100 mg via ORAL
  Filled 2012-04-05 (×14): qty 2

## 2012-04-05 MED ORDER — LISINOPRIL 10 MG PO TABS
40.0000 mg | ORAL_TABLET | Freq: Every day | ORAL | Status: DC
  Administered 2012-04-06 – 2012-04-11 (×6): 40 mg via ORAL
  Filled 2012-04-05 (×7): qty 4

## 2012-04-05 NOTE — Progress Notes (Signed)
Physical Therapy Treatment Patient Details Name: Wesley [name] MRN: [id number] DOB: [birth year] Today's Date: 04/05/2012 Time: [id number] PT Time Calculation (min): 46 min  PT Assessment / Plan / Recommendation Comments on Treatment Session  Pt has had a wound vac applied to R foot wound.  It became apparent to me that he would need to use a walker instead of crutches to assist gait in order that he could carry wound vac with him.  He was instructed in gait with a walker and shown how to attech the wound vac to it.  A walker was provided to himfor use in the room with nursing assist.  PL was done prior to the application of wound vac per instruction of Dr. Romona Curls.  Wound appears very clean.    Follow Up Recommendations       Barriers to Discharge        Equipment Recommendations       Recommendations for Other Services    Frequency     Plan Other (comment) (Unsure if wound care to be continued)    Precautions / Restrictions     Pertinent Vitals/Pain     Mobility  Bed Mobility Bed Mobility: Supine to Sit;Sit to Supine Supine to Sit: 6: Modified independent (Device/Increase time) Sit to Supine: 6: Modified independent (Device/Increase time) Transfers Transfers: Sit to Stand;Stand to Sit Sit to Stand: 6: Modified independent (Device/Increase time) Stand to Sit: 6: Modified independent (Device/Increase time) Ambulation/Gait Ambulation/Gait Assistance: 6: Modified independent (Device/Increase time) Ambulation Distance (Feet): 15 Feet Assistive device: Rolling walker Ambulation/Gait Assistance Details: pt instructed in technique of walking with walker in order to be able to carry wound vac with him Gait Pattern: Within Functional Limits Stairs: No Wheelchair Mobility Wheelchair Mobility: No    Exercises     PT Diagnosis:    PT Problem List:   PT Treatment Interventions:     PT Goals Acute Rehab PT Goals PT Goal Formulation: With patient Pt will Ambulate: 1 - 15  feet;with modified independence;with rolling walker PT Goal: Ambulate - Progress: Met  Visit Information  Last PT Received On: 04/05/12    Subjective Data  Subjective: no c/o   Cognition       Balance     End of Session PT - End of Session Equipment Utilized During Treatment: Gait belt Activity Tolerance: Patient tolerated treatment well Patient left: in bed;with call bell/phone within reach Nurse Communication: Mobility status   GP     Sable Feil 04/05/2012, 12:28 PM

## 2012-04-05 NOTE — Progress Notes (Signed)
Pt tolerating wound vac therapy well. I think I note less swelling.   Doing well.  Filed Vitals:   04/05/12 1501  BP: 163/91  Pulse: 86  Temp: 98.1 F (36.7 C)  Resp: 18

## 2012-04-05 NOTE — Progress Notes (Signed)
POD#2  98.1  Wound debrided of sub cut. Tissue and skin and small amt of tendon, but wound looks very clean and no drainage at all.  Wound vac inserted with nursing team.  Less swelling today.  Wound change on Friday with new wound vac after pulse lavage.  Good progress.

## 2012-04-05 NOTE — Progress Notes (Signed)
Subjective: This man is feeling better. His right foot is less painful. He wonders how he should mobilize now. He is postoperative from incision and drainage. Today he is doing well and has no specific complaints. He has been seen by physical and occupational therapy.           Physical Exam: Blood pressure 182/93, pulse 104, temperature 98.1 F (36.7 C), temperature source Oral, resp. rate 18, height 5\' 11"  (1.803 m), weight 113.354 kg (249 lb 14.4 oz), SpO2 93.00%. He looks systemically well. Heart sounds are present and normal. Lung fields are clear. He is alert and orientated. There is some surrounding cellulitis in the right foot and he has a wound VAC in place at the present time.   Investigations:  Recent Results (from the past 240 hour(s))  BODY FLUID CULTURE     Status: Normal   Collection Time   03/30/12 11:46 AM      Component Value Range Status Comment   Specimen Description ABSCESS   Final    Special Requests Normal   Final    Gram Stain     Final    Value: THIS TEST WAS ORDERED IN ERROR AND HAS BEEN CREDITED.   Culture     Final    Value: THIS TEST WAS ORDERED IN ERROR AND HAS BEEN CREDITED.   Report Status 03/31/2012 FINAL   Final   WOUND CULTURE     Status: Normal   Collection Time   03/30/12 11:46 AM      Component Value Range Status Comment   Specimen Description WOUND RIGHT FOOT   Final    Special Requests NONE   Final    Gram Stain     Final    Value: NO WBC SEEN     NO SQUAMOUS EPITHELIAL CELLS SEEN     FEW GRAM POSITIVE COCCI IN PAIRS   Culture     Final    Value: ABUNDANT METHICILLIN RESISTANT STAPHYLOCOCCUS AUREUS     Note: RIFAMPIN AND GENTAMICIN SHOULD NOT BE USED AS SINGLE DRUGS FOR TREATMENT OF STAPH INFECTIONS. This organism DOES NOT demonstrate inducible Clindamycin resistance in vitro. CRITICAL RESULT CALLED TO, READ BACK BY AND VERIFIED WITH: JESSICA MAYS      04/02/12 1000 BY [email or web address]   Report Status 04/02/2012 FINAL   Final    Organism ID,  Bacteria METHICILLIN RESISTANT STAPHYLOCOCCUS AUREUS   Final   CULTURE, BLOOD (ROUTINE X 2)     Status: Normal   Collection Time   03/30/12 10:19 PM      Component Value Range Status Comment   Specimen Description BLOOD RIGHT HAND   Final    Special Requests BOTTLES DRAWN AEROBIC AND ANAEROBIC 6CC   Final    Culture NO GROWTH 5 DAYS   Final    Report Status 04/04/2012 FINAL   Final   CULTURE, BLOOD (ROUTINE X 2)     Status: Normal   Collection Time   03/30/12 10:19 PM      Component Value Range Status Comment   Specimen Description BLOOD LEFT HAND   Final    Special Requests BOTTLES DRAWN AEROBIC AND ANAEROBIC 6CC   Final    Culture NO GROWTH 5 DAYS   Final    Report Status 04/04/2012 FINAL   Final   SURGICAL PCR SCREEN     Status: Abnormal   Collection Time   04/02/12  4:36 PM      Component Value Range Status Comment  MRSA, PCR POSITIVE (*) NEGATIVE Final    Staphylococcus aureus POSITIVE (*) NEGATIVE Final      Basic Metabolic Panel:  Basename 04/04/12 0512 04/03/12 0715  NA 135 137  K 4.0 3.8  CL 100 100  CO2 28 28  GLUCOSE 187* 182*  BUN 12 11  CREATININE 0.95 0.71  CALCIUM 8.5 8.7  MG -- --  PHOS -- --       CBC:  Basename 04/04/12 0512 04/03/12 0715  WBC 12.7* 10.0  NEUTROABS -- --  HGB 8.7* 9.4*  HCT 27.3* 29.0*  MCV 86.9 85.3  PLT 318 284        Medications:  Scheduled:    . Chlorhexidine Gluconate Cloth  6 each Topical Q0600  . docusate sodium  100 mg Oral Daily  . insulin aspart  0-15 Units Subcutaneous TID WC  . insulin aspart  0-5 Units Subcutaneous QHS  . lisinopril  40 mg Oral Daily  . metFORMIN  1,000 mg Oral BID WC  . metoprolol tartrate  100 mg Oral BID  . mupirocin ointment  1 application Nasal BID  . vancomycin  1,250 mg Intravenous Q12H  . DISCONTD: lisinopril  20 mg Oral Daily  . DISCONTD: metoprolol tartrate  50 mg Oral BID    Impression: 1. Cellulitis of the right foot secondary to MRSA, status post incision and  drainage. 2. Hypertension, not controlled. 3. Newly diagnosed type 2 diabetes mellitus, on oral hypoglycemic agents, better control. 4. Obesity. 5. Anemia of chronic disease.     Plan: 1. Continue with current therapy. 2. Increase antihypertensive medications. 3. Discharge home when okay with surgery.     LOS: 6 days   Doree Albee Pager [phone]  04/05/2012, 11:12 AM

## 2012-04-06 LAB — GLUCOSE, CAPILLARY: Glucose-Capillary: 147 mg/dL — ABNORMAL HIGH (ref 70–99)

## 2012-04-06 LAB — BASIC METABOLIC PANEL
BUN: 10 mg/dL (ref 6–23)
CO2: 30 mEq/L (ref 19–32)
Calcium: 9.1 mg/dL (ref 8.4–10.5)
Chloride: 101 mEq/L (ref 96–112)
Creatinine, Ser: 0.86 mg/dL (ref 0.50–1.35)
Glucose, Bld: 160 mg/dL — ABNORMAL HIGH (ref 70–99)

## 2012-04-06 NOTE — Progress Notes (Signed)
UR Chart Review Completed  

## 2012-04-06 NOTE — Progress Notes (Signed)
POD#3  Pt remains afebrile and swelling and erythema visibly less today. Tolerating wound vac well.  Next wound change will be on Sat with wound vac.  I have coordinated care with Dr. Roderic Palau and nursing staff (Amy coordinator).  Pt needs to be on antibiotics until erythema has subsided completely and there is good basal granulation tissue in evidence.  I will be out of town Thursday and Friday. Will change wound vac Sat. As explained. Any problems [facility] can call me on cell. Filed Vitals:   04/06/12 0537  BP: 171/81  Pulse: 85  Temp: 97.6 F (36.4 C)  Resp: 20

## 2012-04-06 NOTE — Progress Notes (Signed)
From DR. Bradford's last note, he will not be using any further PL.  Pt's mobility has been addressed and he is able to use a walker to facilitate gait.  We will d/c PT orders.

## 2012-04-06 NOTE — Progress Notes (Signed)
[facility] PROGRESS NOTE  Wesley [name] [id number] DOB: May 17, 1961 DOA: 03/30/2012 PCP: No primary provider on file.  Assessment/Plan: Principal Problem:  *Cellulitis and abscess of foot Active Problems:  Diabetes mellitus  HTN (hypertension)  Obesity  Leukocytosis  Anemia of chronic disease  1. MRSA cellulitis and abscess of right foot.  Patient has undergone incision and drainage by Dr. Romona Curls and currently has wound vac in place.  He will need to continue on antibiotics for the time being, until erythema resolves.  Case management is assisting in trying to arrange for home wound vac.  Patient will need another lavage and dressing change to be done by Dr. Romona Curls until further plans on wound can be made 2. HTN. Will titrate meds for better control 3. Diabetes. Fair control on metformin, newly diagnosed, will need home health 4. Anemia of chronic disease, repeat CBC in am  Code Status: full code Family Communication: discussed with patient Disposition Plan: discharge home when medically stable  Brief narrative: This gentleman was admitted to the hospital with right foot cellulitis and abscess.  He had not seen a doctor in many years and was newly diagnosed as a diabetic and hypertension.  Consultants:  General surgery, Dr. Romona Curls  Procedures:  Incision and drainage of right foot abscess 7/07  Antibiotics:  Vancomycin 7/03 to present  Zosyn 7/03 to 7/05  HPI/Subjective: Patient feels that his foot is improving, pain is reasonably controlled, no new complaints  Objective: Filed Vitals:   04/05/12 1501 04/05/12 1821 04/06/12 0537 04/06/12 1446  BP: 163/91 162/80 171/81 174/87  Pulse: 86 93 85 85  Temp: 98.1 F (36.7 C) 98.8 F (37.1 C) 97.6 F (36.4 C) 97.8 F (36.6 C)  TempSrc: Oral Oral Oral   Resp: 18 18 20 20   Height:      Weight:      SpO2: 94% 94% 93% 96%    Intake/Output Summary (Last 24 hours) at 04/06/12 1617 Last data filed at  04/06/12 1200  Gross per 24 hour  Intake    720 ml  Output   1726 ml  Net  -1006 ml    Exam:   General:  NAD, AAOx3  Cardiovascular: S1, S2, RRR  Respiratory: CTA B  Abdomen: soft, NT, BS+  Data Reviewed: Basic Metabolic Panel:  Lab [id number] 0624 04/04/12 0512 04/03/12 0715 04/02/12 0635 04/01/12 0526  NA 138 135 137 134* 135  K 3.9 4.0 3.8 3.6 3.8  CL 101 100 100 98 99  CO2 30 28 28 23 25   GLUCOSE 160* 187* 182* 184* 202*  BUN 10 12 11 12 11   CREATININE 0.86 0.95 0.71 0.68 0.83  CALCIUM 9.1 8.5 8.7 8.8 9.0  MG -- -- -- -- --  PHOS -- -- -- -- --   Liver Function Tests: No results found for this basename: AST:5,ALT:5,ALKPHOS:5,BILITOT:5,PROT:5,ALBUMIN:5 in the last 168 hours No results found for this basename: LIPASE:5,AMYLASE:5 in the last 168 hours No results found for this basename: AMMONIA:5 in the last 168 hours CBC:  Lab 04/04/12 0512 04/03/12 0715 04/02/12 0635 04/01/12 0526 03/31/12 0541  WBC 12.7* 10.0 11.1* 12.9* 14.7*  NEUTROABS -- -- -- -- --  HGB 8.7* 9.4* 9.5* 9.5* 9.5*  HCT 27.3* 29.0* 29.4* 29.1* 29.2*  MCV 86.9 85.3 85.0 85.1 85.1  PLT 318 284 287 288 274   Cardiac Enzymes: No results found for this basename: CKTOTAL:5,CKMB:5,CKMBINDEX:5,TROPONINI:5 in the last 168 hours BNP (last 3 results) No results found for this basename: PROBNP:3 in  the last 8760 hours CBG:  Lab 04/06/12 1114 04/06/12 0756 04/05/12 2002 04/05/12 1636 04/05/12 1121  GLUCAP 147* 138* 166* 119* 172*    Recent Results (from the past 240 hour(s))  BODY FLUID CULTURE     Status: Normal   Collection Time   04/07/12 11:46 AM      Component Value Range Status Comment   Specimen Description ABSCESS   Final    Special Requests Normal   Final    Gram Stain     Final    Value: THIS TEST WAS ORDERED IN ERROR AND HAS BEEN CREDITED.   Culture     Final    Value: THIS TEST WAS ORDERED IN ERROR AND HAS BEEN CREDITED.   Report Status 03/31/2012 FINAL   Final   WOUND CULTURE      Status: Normal   Collection Time   2012/04/07 11:46 AM      Component Value Range Status Comment   Specimen Description WOUND RIGHT FOOT   Final    Special Requests NONE   Final    Gram Stain     Final    Value: NO WBC SEEN     NO SQUAMOUS EPITHELIAL CELLS SEEN     FEW GRAM POSITIVE COCCI IN PAIRS   Culture     Final    Value: ABUNDANT METHICILLIN RESISTANT STAPHYLOCOCCUS AUREUS     Note: RIFAMPIN AND GENTAMICIN SHOULD NOT BE USED AS SINGLE DRUGS FOR TREATMENT OF STAPH INFECTIONS. This organism DOES NOT demonstrate inducible Clindamycin resistance in vitro. CRITICAL RESULT CALLED TO, READ BACK BY AND VERIFIED WITH: JESSICA MAYS      04/02/12 1000 BY [email or web address]   Report Status 04/02/2012 FINAL   Final    Organism ID, Bacteria METHICILLIN RESISTANT STAPHYLOCOCCUS AUREUS   Final   CULTURE, BLOOD (ROUTINE X 2)     Status: Normal   Collection Time   04-07-2012 10:19 PM      Component Value Range Status Comment   Specimen Description BLOOD RIGHT HAND   Final    Special Requests BOTTLES DRAWN AEROBIC AND ANAEROBIC 6CC   Final    Culture NO GROWTH 5 DAYS   Final    Report Status 04/04/2012 FINAL   Final   CULTURE, BLOOD (ROUTINE X 2)     Status: Normal   Collection Time   Apr 07, 2012 10:19 PM      Component Value Range Status Comment   Specimen Description BLOOD LEFT HAND   Final    Special Requests BOTTLES DRAWN AEROBIC AND ANAEROBIC 6CC   Final    Culture NO GROWTH 5 DAYS   Final    Report Status 04/04/2012 FINAL   Final   SURGICAL PCR SCREEN     Status: Abnormal   Collection Time   04/02/12  4:36 PM      Component Value Range Status Comment   MRSA, PCR POSITIVE (*) NEGATIVE Final    Staphylococcus aureus POSITIVE (*) NEGATIVE Final      Studies: Dg Foot Complete Right  2012/04/07  *RADIOLOGY REPORT*  Clinical Data: Large abscess on this fold of foot.  RIGHT FOOT COMPLETE - 3+ VIEW  Comparison: No priors.  Findings: Three views of the right foot demonstrate extensive soft tissue swelling in the  plantar aspect of the foot beneath the calcaneus.  There is also generalized soft tissue swelling.  No retained radiopaque foreign bodies are identified within the soft tissues.  No acute fracture, subluxation or dislocation.  No definite areas of bony lysis are noted.  IMPRESSION: 1.  Soft tissue swelling of the foot as above, without underlying bony abnormality.  Specifically, no definite signs of osteomyelitis, and no retained radiopaque foreign body within the soft tissues.  Original Report Authenticated By: Etheleen Mayhew, M.D.    Scheduled Meds:   . Chlorhexidine Gluconate Cloth  6 each Topical Q0600  . docusate sodium  100 mg Oral Daily  . insulin aspart  0-15 Units Subcutaneous TID WC  . insulin aspart  0-5 Units Subcutaneous QHS  . lisinopril  40 mg Oral Daily  . metFORMIN  1,000 mg Oral BID WC  . metoprolol tartrate  100 mg Oral BID  . mupirocin ointment  1 application Nasal BID  . vancomycin  1,250 mg Intravenous Q12H   Continuous Infusions:   . sodium chloride 20 mL/hr at 04/03/12 1134     Terrell Ostrand [facility] Pager [phone]  If 7PM-7AM, please contact night-coverage [email or web address] Password [email or web address] 04/06/2012, 4:17 PM   LOS: 7 days

## 2012-04-07 ENCOUNTER — Encounter (HOSPITAL_COMMUNITY): Payer: Self-pay | Admitting: General Surgery

## 2012-04-07 LAB — BASIC METABOLIC PANEL
BUN: 10 mg/dL (ref 6–23)
Creatinine, Ser: 0.95 mg/dL (ref 0.50–1.35)
GFR calc non Af Amer: >90 mL/min (ref >90)
Glucose, Bld: 146 mg/dL — ABNORMAL HIGH (ref 70–99)
Potassium: 3.6 mEq/L (ref 3.5–5.1)

## 2012-04-07 LAB — GLUCOSE, CAPILLARY: Glucose-Capillary: 128 mg/dL — ABNORMAL HIGH (ref 70–99)

## 2012-04-07 LAB — CBC
HCT: 28.5 % — ABNORMAL LOW (ref 39.0–52.0)
Hemoglobin: 9 g/dL — ABNORMAL LOW (ref 13.0–17.0)
MCHC: 31.6 g/dL (ref 30.0–36.0)
MCV: 85.8 fL (ref 78.0–100.0)

## 2012-04-07 MED ORDER — HYDROCHLOROTHIAZIDE 25 MG PO TABS
25.0000 mg | ORAL_TABLET | Freq: Every day | ORAL | Status: DC
  Administered 2012-04-07 – 2012-04-11 (×5): 25 mg via ORAL
  Filled 2012-04-07 (×5): qty 1

## 2012-04-07 NOTE — Progress Notes (Signed)
[facility] PROGRESS NOTE  Wesley [name] [id number] DOB: October 11, 1960 DOA: 03/30/2012 PCP: No primary provider on file.  Assessment/Plan: Principal Problem:  *Cellulitis and abscess of foot Active Problems:  Diabetes mellitus  HTN (hypertension)  Obesity  Leukocytosis  Anemia of chronic disease  1. MRSA cellulitis and abscess of right foot.  Patient has undergone incision and drainage by Dr. Romona Curls and currently has wound vac in place.  He will need to continue on antibiotics for the time being, until erythema resolves.  Case management is assisting in trying to arrange for home wound vac.  Patient will need another lavage and dressing change to be done by Dr. Romona Curls until further plans on wound can be made 2. HTN. Will add HCTZ to antihypertensive regimen 3. Diabetes. Fair control on metformin, newly diagnosed, will need home health 4. Anemia of chronic disease, stable, he will need his cancer screenings to be done as outpatient.  Code Status: full code Family Communication: discussed with patient Disposition Plan: discharge home when medically stable  Brief narrative: This gentleman was admitted to the hospital with right foot cellulitis and abscess.  He had not seen a doctor in many years and was newly diagnosed as a diabetic and hypertension.  Consultants:  General surgery, Dr. Romona Curls  Procedures:  Incision and drainage of right foot abscess 7/07  Antibiotics:  Vancomycin 7/03 to present  Zosyn 7/03 to 7/05  HPI/Subjective: Patient feels that his foot is improving. Feels less pain and swelling. no new complaints  Objective: Filed Vitals:   04/06/12 0537 04/06/12 1446 04/06/12 2127 04/07/12 0627  BP: 171/81 174/87 168/79 172/70  Pulse: 85 85 90 86  Temp: 97.6 F (36.4 C) 97.8 F (36.6 C) 98.2 F (36.8 C) 97.9 F (36.6 C)  TempSrc: Oral  Oral Oral  Resp: 20 20 20 18   Height:      Weight:      SpO2: 93% 96% 95% 96%    Intake/Output Summary  (Last 24 hours) at 04/07/12 1528 Last data filed at 04/07/12 1444  Gross per 24 hour  Intake 2282.67 ml  Output   2350 ml  Net -67.33 ml    Exam:   General:  NAD, AAOx3  Cardiovascular: S1, S2, RRR  Respiratory: CTA B  Abdomen: soft, NT, BS+  Data Reviewed: Basic Metabolic Panel:  Lab [id number] 04/06/12 0624 04/04/12 0512 04/03/12 0715 04/02/12 0635  NA 138 138 135 137 134*  K 3.6 3.9 4.0 3.8 3.6  CL 102 101 100 100 98  CO2 30 30 28 28 23   GLUCOSE 146* 160* 187* 182* 184*  BUN 10 10 12 11 12   CREATININE 0.95 0.86 0.95 0.71 0.68  CALCIUM 9.0 9.1 8.5 8.7 8.8  MG -- -- -- -- --  PHOS -- -- -- -- --   Liver Function Tests: No results found for this basename: AST:5,ALT:5,ALKPHOS:5,BILITOT:5,PROT:5,ALBUMIN:5 in the last 168 hours No results found for this basename: LIPASE:5,AMYLASE:5 in the last 168 hours No results found for this basename: AMMONIA:5 in the last 168 hours CBC:  Lab 04/07/12 0616 04/04/12 0512 04/03/12 0715 04/02/12 0635 04/01/12 0526  WBC 7.0 12.7* 10.0 11.1* 12.9*  NEUTROABS -- -- -- -- --  HGB 9.0* 8.7* 9.4* 9.5* 9.5*  HCT 28.5* 27.3* 29.0* 29.4* 29.1*  MCV 85.8 86.9 85.3 85.0 85.1  PLT 383 318 284 287 288   Cardiac Enzymes: No results found for this basename: CKTOTAL:5,CKMB:5,CKMBINDEX:5,TROPONINI:5 in the last 168 hours BNP (last 3 results) No results found  for this basename: PROBNP:3 in the last 8760 hours CBG:  Lab 04/07/12 1158 04/07/12 0725 04/06/12 2102 04/06/12 1630 04/06/12 1114  GLUCAP 128* 137* 120* 147* 147*    Recent Results (from the past 240 hour(s))  BODY FLUID CULTURE     Status: Normal   Collection Time   04-08-2012 11:46 AM      Component Value Range Status Comment   Specimen Description ABSCESS   Final    Special Requests Normal   Final    Gram Stain     Final    Value: THIS TEST WAS ORDERED IN ERROR AND HAS BEEN CREDITED.   Culture     Final    Value: THIS TEST WAS ORDERED IN ERROR AND HAS BEEN CREDITED.   Report  Status 03/31/2012 FINAL   Final   WOUND CULTURE     Status: Normal   Collection Time   2012-04-08 11:46 AM      Component Value Range Status Comment   Specimen Description WOUND RIGHT FOOT   Final    Special Requests NONE   Final    Gram Stain     Final    Value: NO WBC SEEN     NO SQUAMOUS EPITHELIAL CELLS SEEN     FEW GRAM POSITIVE COCCI IN PAIRS   Culture     Final    Value: ABUNDANT METHICILLIN RESISTANT STAPHYLOCOCCUS AUREUS     Note: RIFAMPIN AND GENTAMICIN SHOULD NOT BE USED AS SINGLE DRUGS FOR TREATMENT OF STAPH INFECTIONS. This organism DOES NOT demonstrate inducible Clindamycin resistance in vitro. CRITICAL RESULT CALLED TO, READ BACK BY AND VERIFIED WITH: JESSICA MAYS      04/02/12 1000 BY SMITHERSJ   Report Status 04/02/2012 FINAL   Final    Organism ID, Bacteria METHICILLIN RESISTANT STAPHYLOCOCCUS AUREUS   Final   CULTURE, BLOOD (ROUTINE X 2)     Status: Normal   Collection Time   2012-04-08 10:19 PM      Component Value Range Status Comment   Specimen Description BLOOD RIGHT HAND   Final    Special Requests BOTTLES DRAWN AEROBIC AND ANAEROBIC 6CC   Final    Culture NO GROWTH 5 DAYS   Final    Report Status 04/04/2012 FINAL   Final   CULTURE, BLOOD (ROUTINE X 2)     Status: Normal   Collection Time   April 08, 2012 10:19 PM      Component Value Range Status Comment   Specimen Description BLOOD LEFT HAND   Final    Special Requests BOTTLES DRAWN AEROBIC AND ANAEROBIC 6CC   Final    Culture NO GROWTH 5 DAYS   Final    Report Status 04/04/2012 FINAL   Final   SURGICAL PCR SCREEN     Status: Abnormal   Collection Time   04/02/12  4:36 PM      Component Value Range Status Comment   MRSA, PCR POSITIVE (*) NEGATIVE Final    Staphylococcus aureus POSITIVE (*) NEGATIVE Final      Studies: Dg Foot Complete Right  04-08-12  *RADIOLOGY REPORT*  Clinical Data: Large abscess on this fold of foot.  RIGHT FOOT COMPLETE - 3+ VIEW  Comparison: No priors.  Findings: Three views of the right foot  demonstrate extensive soft tissue swelling in the plantar aspect of the foot beneath the calcaneus.  There is also generalized soft tissue swelling.  No retained radiopaque foreign bodies are identified within the soft tissues.  No acute  fracture, subluxation or dislocation.  No definite areas of bony lysis are noted.  IMPRESSION: 1.  Soft tissue swelling of the foot as above, without underlying bony abnormality.  Specifically, no definite signs of osteomyelitis, and no retained radiopaque foreign body within the soft tissues.  Original Report Authenticated By: Etheleen Mayhew, M.D.    Scheduled Meds:    . Chlorhexidine Gluconate Cloth  6 each Topical Q0600  . docusate sodium  100 mg Oral Daily  . insulin aspart  0-15 Units Subcutaneous TID WC  . insulin aspart  0-5 Units Subcutaneous QHS  . lisinopril  40 mg Oral Daily  . metFORMIN  1,000 mg Oral BID WC  . metoprolol tartrate  100 mg Oral BID  . mupirocin ointment  1 application Nasal BID  . vancomycin  1,250 mg Intravenous Q12H   Continuous Infusions:    . sodium chloride 20 mL/hr at 04/06/12 [facility] Pager [phone]  If 7PM-7AM, please contact night-coverage [email or web address] Password [email or web address] 04/07/2012, 3:28 PM   LOS: 8 days

## 2012-04-07 NOTE — Progress Notes (Signed)
ANTIBIOTIC CONSULT NOTE   Pharmacy Consult for Vancomycin Indication: cellulitis/MRSA Wound  No Known Allergies  Patient Measurements: Height: 5\' 11"  (180.3 cm) Weight: 249 lb 14.4 oz (113.354 kg) IBW/kg (Calculated) : 75.3   Vital Signs: Temp: 97.9 F (36.6 C) (07/11 0627) Temp src: Oral (07/11 0627) BP: 172/70 mmHg (07/11 0627) Pulse Rate: 86  (07/11 0627) Intake/Output from previous day: 07/10 0701 - 07/11 0700 In: 2402.7 [P.O.:840; I.V.:1562.7] Out: 2350 [Urine:2350] Intake/Output from this shift:    Labs:  [facility] 04/07/12 0616 04/06/12 0624  WBC 7.0 --  HGB 9.0* --  PLT 383 --  LABCREA -- --  CREATININE 0.95 0.86   Estimated Creatinine Clearance: 116.4 ml/min (by C-G formula based on Cr of 0.95). No results found for this basename: VANCOTROUGH:2,VANCOPEAK:2,VANCORANDOM:2,GENTTROUGH:2,GENTPEAK:2,GENTRANDOM:2,TOBRATROUGH:2,TOBRAPEAK:2,TOBRARND:2,AMIKACINPEAK:2,AMIKACINTROU:2,AMIKACIN:2, in the last 72 hours  Microbiology:  Wound culture Status: Final result MyChart: Not Released        Newer results are available. Click to view them now.        Component  Value    Specimen Description  WOUND RIGHT FOOT    Special Requests  NONE    Gram Stain  NO WBC SEEN NO SQUAMOUS EPITHELIAL CELLS SEEN FEW GRAM POSITIVE COCCI IN PAIRS    Culture  ABUNDANT METHICILLIN RESISTANT STAPHYLOCOCCUS AUREUS Note: RIFAMPIN AND GENTAMICIN SHOULD NOT BE USED AS SINGLE DRUGS FOR TREATMENT OF STAPH INFECTIONS. This organism DOES NOT demonstrate inducible Clindamycin resistance in vitro. CRITICAL RESULT CALLED TO, READ BACK BY AND VERIFIED WITH: JESSICA MAYS  04/02/12 1000 BY [email or web address]    Report Status  04/02/2012 FINAL    Organism ID, Bacteria  METHICILLIN RESISTANT STAPHYLOCOCCUS AUREUS    Resulting Agency  [facility]     Culture & Susceptibility     METHICILLIN RESISTANT STAPHYLOCOCCUS AUREUS          Antibiotic  Sensitivity  Microscan  Status      CLINDAMYCIN  Sensitive  <=0.25   Final      Method:  MIC      ERYTHROMYCIN  Resistant  >=8  Final      Method:  MIC      GENTAMICIN  Sensitive  <=0.5  Final      Method:  MIC      LEVOFLOXACIN  Intermediate  4  Final      Method:  MIC      OXACILLIN  Resistant  >=4  Final      Method:  MIC      PENICILLIN  Resistant  >=0.5  Final      Method:  MIC      RIFAMPIN  Sensitive  <=0.5  Final      Method:  MIC      TETRACYCLINE  Sensitive  <=1  Final      Method:  MIC      TRIMETH/SULFA  Sensitive  <=10  Final      Method:  MIC      VANCOMYCIN  Sensitive  1  Final      Method:  MIC       Comments  METHICILLIN RESISTANT STAPHYLOCOCCUS AUREUS (MIC)        ABUNDANT METHICILLIN RESISTANT STAPHYLOCOCCUS AUREUS     Recent Results (from the past 720 hour(s))  BODY FLUID CULTURE     Status: Normal   Collection Time   03/30/12 11:46 AM      Component Value Range Status Comment   Specimen Description ABSCESS   Final    Special  Requests Normal   Final    Gram Stain     Final    Value: THIS TEST WAS ORDERED IN ERROR AND HAS BEEN CREDITED.   Culture     Final    Value: THIS TEST WAS ORDERED IN ERROR AND HAS BEEN CREDITED.   Report Status 03/31/2012 FINAL   Final   WOUND CULTURE     Status: Normal   Collection Time   03/30/12 11:46 AM      Component Value Range Status Comment   Specimen Description WOUND RIGHT FOOT   Final    Special Requests NONE   Final    Gram Stain     Final    Value: NO WBC SEEN     NO SQUAMOUS EPITHELIAL CELLS SEEN     FEW GRAM POSITIVE COCCI IN PAIRS   Culture     Final    Value: ABUNDANT METHICILLIN RESISTANT STAPHYLOCOCCUS AUREUS     Note: RIFAMPIN AND GENTAMICIN SHOULD NOT BE USED AS SINGLE DRUGS FOR TREATMENT OF STAPH INFECTIONS. This organism DOES NOT demonstrate inducible Clindamycin resistance in vitro. CRITICAL RESULT CALLED TO, READ BACK BY AND VERIFIED WITH: JESSICA MAYS      04/02/12 1000 BY [email or web address]   Report Status 04/02/2012 FINAL   Final    Organism ID, Bacteria METHICILLIN RESISTANT  STAPHYLOCOCCUS AUREUS   Final   CULTURE, BLOOD (ROUTINE X 2)     Status: Normal   Collection Time   03/30/12 10:19 PM      Component Value Range Status Comment   Specimen Description BLOOD RIGHT HAND   Final    Special Requests BOTTLES DRAWN AEROBIC AND ANAEROBIC 6CC   Final    Culture NO GROWTH 5 DAYS   Final    Report Status 04/04/2012 FINAL   Final   CULTURE, BLOOD (ROUTINE X 2)     Status: Normal   Collection Time   03/30/12 10:19 PM      Component Value Range Status Comment   Specimen Description BLOOD LEFT HAND   Final    Special Requests BOTTLES DRAWN AEROBIC AND ANAEROBIC 6CC   Final    Culture NO GROWTH 5 DAYS   Final    Report Status 04/04/2012 FINAL   Final   SURGICAL PCR SCREEN     Status: Abnormal   Collection Time   04/02/12  4:36 PM      Component Value Range Status Comment   MRSA, PCR POSITIVE (*) NEGATIVE Final    Staphylococcus aureus POSITIVE (*) NEGATIVE Final    Assessment: MRSA in wound noted Vancomycin trough @ Goal. Renal fxn stable  Estimated Creatinine Clearance: 116.4 ml/min (by C-G formula based on Cr of 0.95).  S/P DEBRIDEMENT WOUND 04/03/2012 in OR.  Goal of Therapy:  Vancomycin trough level 10-15 mcg/ml Eradicate infection.  Plan: Continue Vancomycin to 1250mg  iv q12hrs for now If patient sent home on IV Vancomycin, consider 2500mg  iv q24hrs or continue 1250mg  iv q12hrs Monitor renal fxn, SCr twice weekly for duration of Vancomycin Rx  [place] Crane, Alliene Klugh A 04/07/2012,10:29 AM

## 2012-04-07 NOTE — Progress Notes (Signed)
I have been informed by my manager that Dr. Romona Curls plans to have PL done prior to dressing change on Saturday.  We will therefore resume wound care orders fore PL .

## 2012-04-08 LAB — BASIC METABOLIC PANEL
Chloride: 98 mEq/L (ref 96–112)
GFR calc Af Amer: >90 mL/min (ref >90)
GFR calc non Af Amer: >90 mL/min (ref >90)
Glucose, Bld: 132 mg/dL — ABNORMAL HIGH (ref 70–99)
Potassium: 3.5 mEq/L (ref 3.5–5.1)
Sodium: 136 mEq/L (ref 135–145)

## 2012-04-08 LAB — GLUCOSE, CAPILLARY
Glucose-Capillary: 129 mg/dL — ABNORMAL HIGH (ref 70–99)
Glucose-Capillary: 134 mg/dL — ABNORMAL HIGH (ref 70–99)

## 2012-04-08 MED ORDER — SODIUM CHLORIDE 0.9 % IJ SOLN
INTRAMUSCULAR | Status: AC
  Filled 2012-04-08: qty 3

## 2012-04-08 NOTE — Progress Notes (Signed)
ANTIBIOTIC CONSULT NOTE   Pharmacy Consult for Vancomycin Indication: cellulitis/MRSA Wound  No Known Allergies  Patient Measurements: Height: 5\' 11"  (180.3 cm) Weight: 249 lb 14.4 oz (113.354 kg) IBW/kg (Calculated) : 75.3   Vital Signs: Temp: 97.7 F (36.5 C) (07/12 0513) Temp src: Oral (07/12 0513) BP: 163/78 mmHg (07/12 0513) Pulse Rate: 76  (07/12 0513) Intake/Output from previous day: 07/11 0701 - 07/12 0700 In: 480 [P.O.:480] Out: 653 [Urine:650; Stool:3] Intake/Output from this shift:    Labs:  Basename 04/08/12 0536 04/07/12 0616 04/06/12 0624  WBC -- 7.0 --  HGB -- 9.0* --  PLT -- 383 --  LABCREA -- -- --  CREATININE 0.93 0.95 0.86   Estimated Creatinine Clearance: 118.9 ml/min (by C-G formula based on Cr of 0.93). No results found for this basename: VANCOTROUGH:2,VANCOPEAK:2,VANCORANDOM:2,GENTTROUGH:2,GENTPEAK:2,GENTRANDOM:2,TOBRATROUGH:2,TOBRAPEAK:2,TOBRARND:2,AMIKACINPEAK:2,AMIKACINTROU:2,AMIKACIN:2, in the last 72 hours  Microbiology:  Wound culture Status: Final result MyChart: Not Released        Newer results are available. Click to view them now.        Component  Value    Specimen Description  WOUND RIGHT FOOT    Special Requests  NONE    Gram Stain  NO WBC SEEN NO SQUAMOUS EPITHELIAL CELLS SEEN FEW GRAM POSITIVE COCCI IN PAIRS    Culture  ABUNDANT METHICILLIN RESISTANT STAPHYLOCOCCUS AUREUS Note: RIFAMPIN AND GENTAMICIN SHOULD NOT BE USED AS SINGLE DRUGS FOR TREATMENT OF STAPH INFECTIONS. This organism DOES NOT demonstrate inducible Clindamycin resistance in vitro. CRITICAL RESULT CALLED TO, READ BACK BY AND VERIFIED WITH: JESSICA MAYS  04/02/12 1000 BY [email or web address]    Report Status  04/02/2012 FINAL    Organism ID, Bacteria  METHICILLIN RESISTANT STAPHYLOCOCCUS AUREUS    Resulting Agency  [facility]     Culture & Susceptibility     METHICILLIN RESISTANT STAPHYLOCOCCUS AUREUS          Antibiotic  Sensitivity  Microscan  Status    CLINDAMYCIN  Sensitive  <=0.25  Final      Method:  MIC      ERYTHROMYCIN  Resistant  >=8  Final      Method:  MIC      GENTAMICIN  Sensitive  <=0.5  Final      Method:  MIC      LEVOFLOXACIN  Intermediate  4  Final      Method:  MIC      OXACILLIN  Resistant  >=4  Final      Method:  MIC      PENICILLIN  Resistant  >=0.5  Final      Method:  MIC      RIFAMPIN  Sensitive  <=0.5  Final      Method:  MIC      TETRACYCLINE  Sensitive  <=1  Final      Method:  MIC      TRIMETH/SULFA  Sensitive  <=10  Final      Method:  MIC      VANCOMYCIN  Sensitive  1  Final      Method:  MIC       Comments  METHICILLIN RESISTANT STAPHYLOCOCCUS AUREUS (MIC)        ABUNDANT METHICILLIN RESISTANT STAPHYLOCOCCUS AUREUS     Recent Results (from the past 720 hour(s))  BODY FLUID CULTURE     Status: Normal   Collection Time   03/30/12 11:46 AM      Component Value Range Status Comment   Specimen Description ABSCESS   Final  Special Requests Normal   Final    Gram Stain     Final    Value: THIS TEST WAS ORDERED IN ERROR AND HAS BEEN CREDITED.   Culture     Final    Value: THIS TEST WAS ORDERED IN ERROR AND HAS BEEN CREDITED.   Report Status 03/31/2012 FINAL   Final   WOUND CULTURE     Status: Normal   Collection Time   03/30/12 11:46 AM      Component Value Range Status Comment   Specimen Description WOUND RIGHT FOOT   Final    Special Requests NONE   Final    Gram Stain     Final    Value: NO WBC SEEN     NO SQUAMOUS EPITHELIAL CELLS SEEN     FEW GRAM POSITIVE COCCI IN PAIRS   Culture     Final    Value: ABUNDANT METHICILLIN RESISTANT STAPHYLOCOCCUS AUREUS     Note: RIFAMPIN AND GENTAMICIN SHOULD NOT BE USED AS SINGLE DRUGS FOR TREATMENT OF STAPH INFECTIONS. This organism DOES NOT demonstrate inducible Clindamycin resistance in vitro. CRITICAL RESULT CALLED TO, READ BACK BY AND VERIFIED WITH: JESSICA MAYS      04/02/12 1000 BY [email or web address]   Report Status 04/02/2012 FINAL   Final    Organism ID,  Bacteria METHICILLIN RESISTANT STAPHYLOCOCCUS AUREUS   Final   CULTURE, BLOOD (ROUTINE X 2)     Status: Normal   Collection Time   03/30/12 10:19 PM      Component Value Range Status Comment   Specimen Description BLOOD RIGHT HAND   Final    Special Requests BOTTLES DRAWN AEROBIC AND ANAEROBIC 6CC   Final    Culture NO GROWTH 5 DAYS   Final    Report Status 04/04/2012 FINAL   Final   CULTURE, BLOOD (ROUTINE X 2)     Status: Normal   Collection Time   03/30/12 10:19 PM      Component Value Range Status Comment   Specimen Description BLOOD LEFT HAND   Final    Special Requests BOTTLES DRAWN AEROBIC AND ANAEROBIC 6CC   Final    Culture NO GROWTH 5 DAYS   Final    Report Status 04/04/2012 FINAL   Final   SURGICAL PCR SCREEN     Status: Abnormal   Collection Time   04/02/12  4:36 PM      Component Value Range Status Comment   MRSA, PCR POSITIVE (*) NEGATIVE Final    Staphylococcus aureus POSITIVE (*) NEGATIVE Final    Assessment: MRSA in wound noted Vancomycin trough @ Goal. Renal fxn stable  Estimated Creatinine Clearance: 118.9 ml/min (by C-G formula based on Cr of 0.93).  S/P DEBRIDEMENT WOUND 04/03/2012 in OR.  Goal of Therapy:  Vancomycin trough level 10-15 mcg/ml Eradicate infection.  Plan: Continue Vancomycin to 1250mg  iv q12hrs for now If patient sent home on IV Vancomycin, consider 2500mg  iv q24hrs or continue 1250mg  iv q12hrs Monitor renal fxn, SCr twice weekly for duration of Vancomycin Rx  Hart Robinsons A 04/08/2012,9:08 AM

## 2012-04-08 NOTE — Progress Notes (Signed)
[facility] PROGRESS NOTE  Wesley [name] [id number] DOB: 09/28/1961 DOA: 03/30/2012 PCP: No primary provider on file.  Assessment/Plan: Principal Problem:  *Cellulitis and abscess of foot Active Problems:  Diabetes mellitus  HTN (hypertension)  Obesity  Leukocytosis  Anemia of chronic disease  1. MRSA cellulitis and abscess of right foot.  Patient has undergone incision and drainage by Dr. Romona Curls and currently has wound vac in place.  He will need to continue on antibiotics for the time being, until erythema resolves. Case management has arranged for patient to go home with a wound VAC. He will also need home health nursing to assist with his wound care. He will be seen by Dr. Romona Curls tomorrow and undergo dressing change with pulse lavage. Further disposition will be determined after dressing change.  2. HTN. Improving on current regimen 3. Diabetes. Fair control on metformin, newly diagnosed, will need home health 4. Anemia of chronic disease, stable, he will need his cancer screenings to be done as outpatient.  Code Status: full code Family Communication: discussed with patient Disposition Plan: discharge home when medically stable  Brief narrative: This gentleman was admitted to the hospital with right foot cellulitis and abscess.  He had not seen a doctor in many years and was newly diagnosed as a diabetic and hypertension.  Consultants:  General surgery, Dr. Romona Curls  Procedures:  Incision and drainage of right foot abscess 7/07  Antibiotics:  Vancomycin 7/03 to present  Zosyn 7/03 to 7/05  HPI/Subjective: Patient feels that his foot is improving. Feels less pain and swelling. no new complaints  Objective: Filed Vitals:   04/07/12 1805 04/07/12 2053 04/08/12 0513 04/08/12 1410  BP: 148/87 156/70 163/78 147/79  Pulse:  86 76 73  Temp:  98.3 F (36.8 C) 97.7 F (36.5 C) 97.9 F (36.6 C)  TempSrc:  Oral Oral   Resp:  20 20 19   Height:        Weight:      SpO2:  92% 92% 93%    Intake/Output Summary (Last 24 hours) at 04/08/12 1525 Last data filed at 04/08/12 1411  Gross per 24 hour  Intake    240 ml  Output   1401 ml  Net  -1161 ml    Exam:   General:  NAD, AAOx3  Cardiovascular: S1, S2, RRR  Respiratory: CTA B  Abdomen: soft, NT, BS+  Data Reviewed: Basic Metabolic Panel:  Lab [id number] 04/07/12 0616 04/06/12 0624 04/04/12 0512 04/03/12 0715  NA 136 138 138 135 137  K 3.5 3.6 3.9 4.0 3.8  CL 98 102 101 100 100  CO2 29 30 30 28 28   GLUCOSE 132* 146* 160* 187* 182*  BUN 10 10 10 12 11   CREATININE 0.93 0.95 0.86 0.95 0.71  CALCIUM 9.3 9.0 9.1 8.5 8.7  MG -- -- -- -- --  PHOS -- -- -- -- --   Liver Function Tests: No results found for this basename: AST:5,ALT:5,ALKPHOS:5,BILITOT:5,PROT:5,ALBUMIN:5 in the last 168 hours No results found for this basename: LIPASE:5,AMYLASE:5 in the last 168 hours No results found for this basename: AMMONIA:5 in the last 168 hours CBC:  Lab 04/07/12 0616 04/04/12 0512 04/03/12 0715 04/02/12 0635  WBC 7.0 12.7* 10.0 11.1*  NEUTROABS -- -- -- --  HGB 9.0* 8.7* 9.4* 9.5*  HCT 28.5* 27.3* 29.0* 29.4*  MCV 85.8 86.9 85.3 85.0  PLT 383 318 284 287   Cardiac Enzymes: No results found for this basename: CKTOTAL:5,CKMB:5,CKMBINDEX:5,TROPONINI:5 in the last 168 hours BNP (  last 3 results) No results found for this basename: PROBNP:3 in the last 8760 hours CBG:  Lab 04/08/12 1139 04/08/12 0723 04/07/12 2051 04/07/12 1642 04/07/12 1158  GLUCAP 112* 121* 129* 119* 128*    Recent Results (from the past 240 hour(s))  BODY FLUID CULTURE     Status: Normal   Collection Time   April 15, 2012 11:46 AM      Component Value Range Status Comment   Specimen Description ABSCESS   Final    Special Requests Normal   Final    Gram Stain     Final    Value: THIS TEST WAS ORDERED IN ERROR AND HAS BEEN CREDITED.   Culture     Final    Value: THIS TEST WAS ORDERED IN ERROR AND HAS BEEN  CREDITED.   Report Status 03/31/2012 FINAL   Final   WOUND CULTURE     Status: Normal   Collection Time   04/15/2012 11:46 AM      Component Value Range Status Comment   Specimen Description WOUND RIGHT FOOT   Final    Special Requests NONE   Final    Gram Stain     Final    Value: NO WBC SEEN     NO SQUAMOUS EPITHELIAL CELLS SEEN     FEW GRAM POSITIVE COCCI IN PAIRS   Culture     Final    Value: ABUNDANT METHICILLIN RESISTANT STAPHYLOCOCCUS AUREUS     Note: RIFAMPIN AND GENTAMICIN SHOULD NOT BE USED AS SINGLE DRUGS FOR TREATMENT OF STAPH INFECTIONS. This organism DOES NOT demonstrate inducible Clindamycin resistance in vitro. CRITICAL RESULT CALLED TO, READ BACK BY AND VERIFIED WITH: JESSICA MAYS      04/02/12 1000 BY SMITHERSJ   Report Status 04/02/2012 FINAL   Final    Organism ID, Bacteria METHICILLIN RESISTANT STAPHYLOCOCCUS AUREUS   Final   CULTURE, BLOOD (ROUTINE X 2)     Status: Normal   Collection Time   April 15, 2012 10:19 PM      Component Value Range Status Comment   Specimen Description BLOOD RIGHT HAND   Final    Special Requests BOTTLES DRAWN AEROBIC AND ANAEROBIC 6CC   Final    Culture NO GROWTH 5 DAYS   Final    Report Status 04/04/2012 FINAL   Final   CULTURE, BLOOD (ROUTINE X 2)     Status: Normal   Collection Time   15-Apr-2012 10:19 PM      Component Value Range Status Comment   Specimen Description BLOOD LEFT HAND   Final    Special Requests BOTTLES DRAWN AEROBIC AND ANAEROBIC 6CC   Final    Culture NO GROWTH 5 DAYS   Final    Report Status 04/04/2012 FINAL   Final   SURGICAL PCR SCREEN     Status: Abnormal   Collection Time   04/02/12  4:36 PM      Component Value Range Status Comment   MRSA, PCR POSITIVE (*) NEGATIVE Final    Staphylococcus aureus POSITIVE (*) NEGATIVE Final      Studies: Dg Foot Complete Right  04/15/2012  *RADIOLOGY REPORT*  Clinical Data: Large abscess on this fold of foot.  RIGHT FOOT COMPLETE - 3+ VIEW  Comparison: No priors.  Findings: Three views  of the right foot demonstrate extensive soft tissue swelling in the plantar aspect of the foot beneath the calcaneus.  There is also generalized soft tissue swelling.  No retained radiopaque foreign bodies are identified within  the soft tissues.  No acute fracture, subluxation or dislocation.  No definite areas of bony lysis are noted.  IMPRESSION: 1.  Soft tissue swelling of the foot as above, without underlying bony abnormality.  Specifically, no definite signs of osteomyelitis, and no retained radiopaque foreign body within the soft tissues.  Original Report Authenticated By: Etheleen Mayhew, M.D.    Scheduled Meds:    . docusate sodium  100 mg Oral Daily  . hydrochlorothiazide  25 mg Oral Daily  . insulin aspart  0-15 Units Subcutaneous TID WC  . insulin aspart  0-5 Units Subcutaneous QHS  . lisinopril  40 mg Oral Daily  . metFORMIN  1,000 mg Oral BID WC  . metoprolol tartrate  100 mg Oral BID  . sodium chloride      . sodium chloride      . vancomycin  1,250 mg Intravenous Q12H   Continuous Infusions:    . sodium chloride 20 mL/hr at 04/06/12 [facility] Pager [phone]  If 7PM-7AM, please contact night-coverage [email or web address] Password [email or web address] 04/08/2012, 3:25 PM   LOS: 9 days

## 2012-04-09 DIAGNOSIS — E669 Obesity, unspecified: Secondary | ICD-10-CM

## 2012-04-09 LAB — GLUCOSE, CAPILLARY
Glucose-Capillary: 127 mg/dL — ABNORMAL HIGH (ref 70–99)
Glucose-Capillary: 125 mg/dL — ABNORMAL HIGH (ref 70–99)
Glucose-Capillary: 123 mg/dL — ABNORMAL HIGH (ref 70–99)

## 2012-04-09 NOTE — Progress Notes (Signed)
POD#5  Dressing changed today with wound care nurse.  Wound lavaged and debrided skin sub cut tissue and small amt. of tendon.  The wound looks very clean; there is no drainage what so ever and the swelling has resolved and there is hardly any erythema. A new wound vac was placed.. The wound measures 6 and 1/2 cm in width and 7 cm in length and 4 cm in depth.  blood sugars have been well controlled as has been the pain. Filed Vitals:   04/09/12 0442  BP: 153/75  Pulse: 77  Temp: 97.5 F (36.4 C)  Resp: 20    We will plan for another pulse lavage Monday and wound vac change at that time in the hospital at noon.  He can go home on PO antibiotics and I will do the next dressing change in my office.  Arrangements with home health care are problematic now but are being sorted out. I appreciate wound care nurse's dedication.

## 2012-04-09 NOTE — Progress Notes (Signed)
Physical Therapy Wound Treatment Patient Details  Name: Wesley [name] MRN: [id number] Date of Birth: 01-02-1961  Today's Date: 04/09/2012 Time: [id number] Time Calculation (min): 15 min Charges: Selective debridement (= or < 20 cm)   Subjective  Subjective: I'm ready to go home.  Pain Score: Pain Score: 0-No pain  Wound Assessment   04/09/12 0902  Wound 03/30/12 Blister (Serous filled);Blister (Blood filled) Foot Right  Date First Assessed/Time First Assessed: 03/30/12 1109   Wound Type: Blister (Serous filled);Blister (Blood filled)  Location: Foot  Location Orientation: Right  Present on Admission: Yes  Site / Wound Assessment Clean  % Wound base Red or Granulating 100%  % Wound base Yellow 0%  Peri-wound Assessment Intact  Drainage Description Serosanguineous  Non-staged Wound Description Full thickness  Treatment Hydrotherapy (Pulse lavage)  Dressing Changed (wound vac applied by nursing)  Hydrotherapy  Pulsed Lavage with Suction (psi) 175 psi  Pulsed Lavage with Suction - Normal Saline Used 1000 mL  Pulsed Lavage Tip Tip with splash shield  Pulsed lavage therapy - wound location PL done to R heel wound prior to wound vac per MD request   Hydrotherapy Pulsed lavage therapy - wound location: PL done to R heel wound prior to wound vac per MD request Pulsed Lavage with Suction (psi): 175 psi Pulsed Lavage with Suction - Normal Saline Used: 1000 mL Pulsed Lavage Tip: Tip with splash shield   Wound Assessment and Plan  Wound Therapy - Assess/Plan/Recommendations Wound Therapy - Clinical Statement: PL completed per MD request after wound vac removal. Wound is clean with red granular bed. Wound Plan: Continue per MD orders.     Rachelle Hora, PTA 04/09/2012, 9:09 AM

## 2012-04-09 NOTE — Progress Notes (Signed)
Subjective: This man is right foot is slowly improving I believe. He continues to have local wound care and wound VAC as well as pulse lavage. Has had no fever. He is keen to mobilize and continued to retain muscle strength, I agree with this.           Physical Exam: Blood pressure 153/75, pulse 77, temperature 97.5 F (36.4 C), temperature source Oral, resp. rate 20, height 5\' 11"  (1.803 m), weight 113.354 kg (249 lb 14.4 oz), SpO2 96.00%. He looks systemically well. Heart sounds are present and normal. Lung fields clear. He is alert and oriented without any focal signs. The right foot looks mostly the same as that seen previously with some surrounding cellulitis anteriorly and the wound on the posterior/plantar surface.   Investigations:  Recent Results (from the past 240 hour(s))  BODY FLUID CULTURE     Status: Normal   Collection Time   03/30/12 11:46 AM      Component Value Range Status Comment   Specimen Description ABSCESS   Final    Special Requests Normal   Final    Gram Stain     Final    Value: THIS TEST WAS ORDERED IN ERROR AND HAS BEEN CREDITED.   Culture     Final    Value: THIS TEST WAS ORDERED IN ERROR AND HAS BEEN CREDITED.   Report Status 03/31/2012 FINAL   Final   WOUND CULTURE     Status: Normal   Collection Time   03/30/12 11:46 AM      Component Value Range Status Comment   Specimen Description WOUND RIGHT FOOT   Final    Special Requests NONE   Final    Gram Stain     Final    Value: NO WBC SEEN     NO SQUAMOUS EPITHELIAL CELLS SEEN     FEW GRAM POSITIVE COCCI IN PAIRS   Culture     Final    Value: ABUNDANT METHICILLIN RESISTANT STAPHYLOCOCCUS AUREUS     Note: RIFAMPIN AND GENTAMICIN SHOULD NOT BE USED AS SINGLE DRUGS FOR TREATMENT OF STAPH INFECTIONS. This organism DOES NOT demonstrate inducible Clindamycin resistance in vitro. CRITICAL RESULT CALLED TO, READ BACK BY AND VERIFIED WITH: JESSICA MAYS      04/02/12 1000 BY SMITHERSJ   Report Status  04/02/2012 FINAL   Final    Organism ID, Bacteria METHICILLIN RESISTANT STAPHYLOCOCCUS AUREUS   Final   CULTURE, BLOOD (ROUTINE X 2)     Status: Normal   Collection Time   03/30/12 10:19 PM      Component Value Range Status Comment   Specimen Description BLOOD RIGHT HAND   Final    Special Requests BOTTLES DRAWN AEROBIC AND ANAEROBIC 6CC   Final    Culture NO GROWTH 5 DAYS   Final    Report Status 04/04/2012 FINAL   Final   CULTURE, BLOOD (ROUTINE X 2)     Status: Normal   Collection Time   03/30/12 10:19 PM      Component Value Range Status Comment   Specimen Description BLOOD LEFT HAND   Final    Special Requests BOTTLES DRAWN AEROBIC AND ANAEROBIC 6CC   Final    Culture NO GROWTH 5 DAYS   Final    Report Status 04/04/2012 FINAL   Final   SURGICAL PCR SCREEN     Status: Abnormal   Collection Time   04/02/12  4:36 PM      Component  Value Range Status Comment   MRSA, PCR POSITIVE (*) NEGATIVE Final    Staphylococcus aureus POSITIVE (*) NEGATIVE Final      Basic Metabolic Panel:  Basename 04/08/12 0536 04/07/12 0616  NA 136 138  K 3.5 3.6  CL 98 102  CO2 29 30  GLUCOSE 132* 146*  BUN 10 10  CREATININE 0.93 0.95  CALCIUM 9.3 9.0  MG -- --  PHOS -- --       CBC:  Basename 04/07/12 0616  WBC 7.0  NEUTROABS --  HGB 9.0*  HCT 28.5*  MCV 85.8  PLT 383        Medications:  Scheduled:   . docusate sodium  100 mg Oral Daily  . hydrochlorothiazide  25 mg Oral Daily  . insulin aspart  0-15 Units Subcutaneous TID WC  . insulin aspart  0-5 Units Subcutaneous QHS  . lisinopril  40 mg Oral Daily  . metFORMIN  1,000 mg Oral BID WC  . metoprolol tartrate  100 mg Oral BID  . sodium chloride      . sodium chloride      . vancomycin  1,250 mg Intravenous Q12H    Impression: 1. Cellulitis and abscess of the right foot, status post incision and drainage. 2. Hypertension, better controlled. 3. Type 2 diabetes mellitus, improved control. 4. Obesity.     Plan: 1.  Continue with intravenous antibiotics and local wound care. 2. The plan is to discharge this man on Monday and then Dr. Romona Curls will follow him up in the office.     LOS: 10 days   Doree Albee Pager [phone]  04/09/2012, 10:36 AM

## 2012-04-10 LAB — GLUCOSE, CAPILLARY
Glucose-Capillary: 136 mg/dL — ABNORMAL HIGH (ref 70–99)
Glucose-Capillary: 125 mg/dL — ABNORMAL HIGH (ref 70–99)
Glucose-Capillary: 120 mg/dL — ABNORMAL HIGH (ref 70–99)
Glucose-Capillary: 114 mg/dL — ABNORMAL HIGH (ref 70–99)

## 2012-04-10 NOTE — Progress Notes (Signed)
Filed Vitals:   04/10/12 1400  BP: 123/69  Pulse: 78  Temp: 97.8 F (36.6 C)  Resp: 18  No problems with wound vac.  Leg looks quite good; swelling has resolved and there is minimal erythema.  Doing well will change dressing in AM and hopefully we can coordinate OP care that will involve approx.. 8 weeks of wound vac care.

## 2012-04-10 NOTE — Progress Notes (Signed)
Subjective: This man is right foot is slowly improving I believe. He continues to have local wound care and wound VAC as well as pulse lavage. Has had no fever.            Physical Exam: Blood pressure 164/87, pulse 75, temperature 97.7 F (36.5 C), temperature source Oral, resp. rate 16, height 5\' 11"  (1.803 m), weight 113.354 kg (249 lb 14.4 oz), SpO2 95.00%. He looks systemically well. Heart sounds are present and normal. Lung fields clear. He is alert and oriented without any focal signs. The right foot looks better than yesterday with less cellulitis on the dorsal aspect of his foot.   Investigations:  Recent Results (from the past 240 hour(s))  SURGICAL PCR SCREEN     Status: Abnormal   Collection Time   04/02/12  4:36 PM      Component Value Range Status Comment   MRSA, PCR POSITIVE (*) NEGATIVE Final    Staphylococcus aureus POSITIVE (*) NEGATIVE Final      Basic Metabolic Panel:  Basename 04/08/12 0536  NA 136  K 3.5  CL 98  CO2 29  GLUCOSE 132*  BUN 10  CREATININE 0.93  CALCIUM 9.3  MG --  PHOS --              Medications:  Scheduled:    . docusate sodium  100 mg Oral Daily  . hydrochlorothiazide  25 mg Oral Daily  . insulin aspart  0-15 Units Subcutaneous TID WC  . insulin aspart  0-5 Units Subcutaneous QHS  . lisinopril  40 mg Oral Daily  . metFORMIN  1,000 mg Oral BID WC  . metoprolol tartrate  100 mg Oral BID  . vancomycin  1,250 mg Intravenous Q12H    Impression: 1. Cellulitis and abscess of the right foot, status post incision and drainage. 2. Hypertension, better controlled. 3. Type 2 diabetes mellitus, improved control. 4. Obesity.     Plan: 1. Continue with intravenous antibiotics and local wound care. 2. The plan is to discharge this man on Monday and then Dr. Romona Curls will follow him up in the office.     LOS: 11 days   Doree Albee Pager [phone]  04/10/2012, 9:36 AM

## 2012-04-11 LAB — GLUCOSE, CAPILLARY
Glucose-Capillary: 132 mg/dL — ABNORMAL HIGH (ref 70–99)
Glucose-Capillary: 134 mg/dL — ABNORMAL HIGH (ref 70–99)
Glucose-Capillary: 116 mg/dL — ABNORMAL HIGH (ref 70–99)

## 2012-04-11 LAB — BASIC METABOLIC PANEL
CO2: 30 mEq/L (ref 19–32)
Calcium: 9.4 mg/dL (ref 8.4–10.5)
Creatinine, Ser: 1.13 mg/dL (ref 0.50–1.35)
GFR calc non Af Amer: 73 mL/min — ABNORMAL LOW (ref >90)
Sodium: 139 mEq/L (ref 135–145)

## 2012-04-11 MED ORDER — ALPRAZOLAM 0.5 MG PO TABS: 0.5000 mg | ORAL_TABLET | Freq: Three times a day (TID) | ORAL | Status: AC | PRN

## 2012-04-11 MED ORDER — DSS 100 MG PO CAPS: 100.0000 mg | ORAL_CAPSULE | Freq: Every day | ORAL | Status: AC

## 2012-04-11 MED ORDER — HYDROCHLOROTHIAZIDE 25 MG PO TABS: 25.0000 mg | ORAL_TABLET | Freq: Every day | ORAL | Status: DC

## 2012-04-11 MED ORDER — METFORMIN HCL 1000 MG PO TABS: 1000.0000 mg | ORAL_TABLET | Freq: Two times a day (BID) | ORAL | Status: DC

## 2012-04-11 MED ORDER — METOPROLOL TARTRATE 100 MG PO TABS: 100.0000 mg | ORAL_TABLET | Freq: Two times a day (BID) | ORAL | Status: DC

## 2012-04-11 MED ORDER — LISINOPRIL 40 MG PO TABS: 40.0000 mg | ORAL_TABLET | Freq: Every day | ORAL | Status: DC

## 2012-04-11 MED ORDER — DOXYCYCLINE HYCLATE 50 MG PO CAPS: 100.0000 mg | ORAL_CAPSULE | Freq: Two times a day (BID) | ORAL | Status: DC

## 2012-04-11 MED ORDER — VANCOMYCIN HCL 1000 MG IV SOLR
1250.0000 mg | Freq: Two times a day (BID) | INTRAVENOUS | Status: DC
  Administered 2012-04-11: 1250 mg via INTRAVENOUS
  Filled 2012-04-11 (×3): qty 1250

## 2012-04-11 MED ORDER — HYDROCODONE-ACETAMINOPHEN 5-325 MG PO TABS: 1.0000 | ORAL_TABLET | ORAL | Status: AC | PRN

## 2012-04-11 NOTE — Progress Notes (Signed)
Physical Therapy Treatment Patient Details Name: Wesley [name] MRN: [id number] DOB: November 03, 1960 Today's Date: 04/11/2012 Time: [id number] PT Time Calculation (min): 21 min  PT Assessment / Plan / Recommendation Comments on Treatment Session  PL done to wound following removal of wound vac sponge.  Wound apears to be healing well.    Follow Up Recommendations  No PT follow up    Barriers to Discharge        Equipment Recommendations  None recommended by PT    Recommendations for Other Services    Frequency     Plan      Precautions / Restrictions     Pertinent Vitals/Pain     Mobility       Exercises     PT Diagnosis:    PT Problem List:   PT Treatment Interventions:     PT Goals    Visit Information  Last PT Received On: 04/11/12    Subjective Data  Subjective: wants to go home   Cognition       Balance     End of Session PT - End of Session Activity Tolerance: Patient tolerated treatment well Patient left: in bed;with call bell/phone within reach   GP     Demetrios Isaacs L 04/11/2012, 12:21 PM

## 2012-04-11 NOTE — Progress Notes (Signed)
Discharge instructions provided to pt and pt's spouse.  8 prescriptions were given to pt and spouse.  Handouts were given with a variety to information on Diabetes Education and HTN.  Both verbalized understanding of d/c instructions.  Informed pt of appt at Dr. Hulan Amato office on Friday, April 15, 2012 at 10 a.m.

## 2012-04-11 NOTE — Progress Notes (Signed)
POD#7  Remains afebrile.  Blood sugars under control, Wound was lavaged and only small amt of wound detritus debrided  With scalpel.  Th e wound looks very clean.  Erythema and swelling resolved  Reapplied wound vac.Marland Kitchen  Discharge planning in progress. I will follow in office.  Filed Vitals:   04/11/12 0959  BP: 135/70  Pulse: 79  Temp:   Resp:

## 2012-04-11 NOTE — Discharge Summary (Signed)
Physician Discharge Summary  Wesley [name] [id number] DOB: 1961-05-26 DOA: 03/30/2012    Admit date: 03/30/2012 Discharge date: 04/11/2012  Recommendations for Outpatient Follow-up:  1. Wound VAC care for 8 weeks.  2. Needs diabetes and hypertension closely followed.  Discharge Diagnoses:    1. Cellulitis and abscess of the right foot, MRSA, status post incision and drainage by Dr. Romona Curls, surgery. Local wound care and wound VAC therapy. 2. Hypertension. 3. Type 2 diabetes mellitus. 4. Obesity. 5. Anemia of chronic disease.  Discharge Condition: Stable.  Diet recommendation: Carb modified diet.  History of present illness:  This very pleasant 51 year old man presented to the hospital with symptoms of right foot pain and swelling. The symptoms started 2 weeks prior to admission. He was found to have cellulitis in this area with probable abscess.  Hospital Course:  He was seen by Dr. Romona Curls, surgery, who recommended incision and drainage which he underwent. Postoperatively he has required local wound care and wound VAC therapy. Culture of the area showed MRSA. He has therefore been on intravenous vancomycin. This has been now for approximately 2 weeks. He will need another month of oral antibiotics. More importantly he will need care of the wound, which Dr. Romona Curls is organizing through his office. Also, his diabetes and hypertension were monitored closely and medications were adjusted. The control is not optimal yet but this will need to be followed closely as an outpatient.  Procedures:  Incision and drainage by Dr. Romona Curls.  Consultations:  Surgery, Dr. Romona Curls.  Discharge Exam: Filed Vitals:   04/11/12 0603  BP: 159/82  Pulse: 75  Temp: 97.9 F (36.6 C)  Resp: 18   Filed Vitals:   04/10/12 0442 04/10/12 1400 04/10/12 2137 04/11/12 0603  BP: 164/87 123/69 143/76 159/82  Pulse: 75 78 87 75  Temp: 97.7 F (36.5 C) 97.8 F (36.6 C) 98 F (36.7 C) 97.9 F  (36.6 C)  TempSrc: Oral Oral Oral Oral  Resp: 16 18 16 18   Height:      Weight:      SpO2: 95% 93% 95% 95%   General: Looks systemically well. Cardiovascular: Heart sounds are present and normal. No evidence of heart failure. Respiratory: Lung fields are clear. He is alert and orientated. No focal neurological signs.  Discharge Instructions  Discharge Orders    Future Orders Please Complete By Expires   Ambulatory referral to Nutrition and Diabetic Education      Comments:   New onset Diabetes   Diet - low sodium heart healthy      Increase activity slowly        Medication List  As of 04/11/2012  9:02 AM   STOP taking these medications         ibuprofen 200 MG tablet      sulfamethoxazole-trimethoprim 800-160 MG per tablet         TAKE these medications         ALPRAZolam 0.5 MG tablet   Commonly known as: XANAX   Take 1 tablet (0.5 mg total) by mouth 3 (three) times daily as needed for anxiety.      doxycycline 50 MG capsule   Commonly known as: VIBRAMYCIN   Take 2 capsules (100 mg total) by mouth 2 (two) times daily.      DSS 100 MG Caps   Take 100 mg by mouth daily.      hydrochlorothiazide 25 MG tablet   Commonly known as: HYDRODIURIL   Take 1 tablet (25 mg  total) by mouth daily.      HYDROcodone-acetaminophen 5-325 MG per tablet   Commonly known as: NORCO   Take 1-2 tablets by mouth every 4 (four) hours as needed.      lisinopril 40 MG tablet   Commonly known as: PRINIVIL,ZESTRIL   Take 1 tablet (40 mg total) by mouth daily.      metFORMIN 1000 MG tablet   Commonly known as: GLUCOPHAGE   Take 1 tablet (1,000 mg total) by mouth 2 (two) times daily with a meal.      metoprolol 100 MG tablet   Commonly known as: LOPRESSOR   Take 1 tablet (100 mg total) by mouth 2 (two) times daily.           Follow-up Information    Schedule an appointment as soon as possible for a visit with Scherry Ran, MD.   Contact information:   [address] [place] [phone]           The results of significant diagnostics from this hospitalization (including imaging, microbiology, ancillary and laboratory) are listed below for reference.    Significant Diagnostic Studies: Dg Foot Complete Right  23-Apr-2012  *RADIOLOGY REPORT*  Clinical Data: Large abscess on this fold of foot.  RIGHT FOOT COMPLETE - 3+ VIEW  Comparison: No priors.  Findings: Three views of the right foot demonstrate extensive soft tissue swelling in the plantar aspect of the foot beneath the calcaneus.  There is also generalized soft tissue swelling.  No retained radiopaque foreign bodies are identified within the soft tissues.  No acute fracture, subluxation or dislocation.  No definite areas of bony lysis are noted.  IMPRESSION: 1.  Soft tissue swelling of the foot as above, without underlying bony abnormality.  Specifically, no definite signs of osteomyelitis, and no retained radiopaque foreign body within the soft tissues.  Original Report Authenticated By: Etheleen Mayhew, M.D.    Microbiology: Recent Results (from the past 240 hour(s))  SURGICAL PCR SCREEN     Status: Abnormal   Collection Time   04/02/12  4:36 PM      Component Value Range Status Comment   MRSA, PCR POSITIVE (*) NEGATIVE Final    Staphylococcus aureus POSITIVE (*) NEGATIVE Final      Labs: Basic Metabolic Panel:  Lab [id number] 04/08/12 0536 04/07/12 0616 04/06/12 0624  NA 139 136 138 138  K 3.6 3.5 3.6 3.9  CL 101 98 102 101  CO2 30 29 30 30   GLUCOSE 122* 132* 146* 160*  BUN 15 10 10 10   CREATININE 1.13 0.93 0.95 0.86  CALCIUM 9.4 9.3 9.0 9.1  MG -- -- -- --  PHOS -- -- -- --       CBC:  Lab 04/07/12 0616  WBC 7.0  NEUTROABS --  HGB 9.0*  HCT 28.5*  MCV 85.8  PLT 383    CBG:  Lab 04/11/12 0728 04/10/12 2139 04/10/12 1646 04/10/12 1215 04/10/12 0732  GLUCAP 132* 114* 120* 125* 136*    Time coordinating discharge: less than 30  minutes.  SignedDoree Albee  [facility] 04/11/2012, 9:02 AM

## 2012-04-11 NOTE — Progress Notes (Signed)
Last dose of Vancomycin moved to 1600 per order from Dr. Romona Curls.  Pt will be discharged after completion.

## 2012-04-12 NOTE — Telephone Encounter (Signed)
Noted discharge from [facility] on 04/11/12. Sent letter to pt home via [place] [employer] in attempt to contact pt to schedule appointment. Also provided information re: free diabetes group classes at [facility].

## 2012-04-15 NOTE — Telephone Encounter (Signed)
Sent letter to pt home via [place] [employer] in attempt to contact pt to schedule appointment. Also provided information re: free group diabetes classes at [facility].

## 2012-04-25 NOTE — Telephone Encounter (Signed)
Sent letter to pt home via [place] [employer] in attempt to contact pt to schedule appointment. Also provided information re: free group diabetes classes at [facility].

## 2012-04-29 NOTE — Telephone Encounter (Signed)
Pt has not responded to attempts to contact to schedule appointment. Referral filed.  

## 2014-09-12 ENCOUNTER — Inpatient Hospital Stay (HOSPITAL_COMMUNITY)
Admission: EM | Admit: 2014-09-12 | Discharge: 2014-09-16 | DRG: 682 | Disposition: A | Discharge status: Home Health | Payer: BC Managed Care – PPO | Attending: Internal Medicine | Admitting: Internal Medicine

## 2014-09-12 ENCOUNTER — Emergency Department (HOSPITAL_COMMUNITY): Payer: BC Managed Care – PPO

## 2014-09-12 ENCOUNTER — Encounter (HOSPITAL_COMMUNITY): Payer: Self-pay | Admitting: *Deleted

## 2014-09-12 DIAGNOSIS — N179 Acute kidney failure, unspecified: Secondary | ICD-10-CM | POA: Diagnosis not present

## 2014-09-12 DIAGNOSIS — Z8049 Family history of malignant neoplasm of other genital organs: Secondary | ICD-10-CM | POA: Diagnosis not present

## 2014-09-12 DIAGNOSIS — N170 Acute kidney failure with tubular necrosis: Secondary | ICD-10-CM

## 2014-09-12 DIAGNOSIS — R601 Generalized edema: Secondary | ICD-10-CM | POA: Diagnosis present

## 2014-09-12 DIAGNOSIS — L03115 Cellulitis of right lower limb: Secondary | ICD-10-CM | POA: Diagnosis present

## 2014-09-12 DIAGNOSIS — R0602 Shortness of breath: Secondary | ICD-10-CM

## 2014-09-12 DIAGNOSIS — G4730 Sleep apnea, unspecified: Secondary | ICD-10-CM | POA: Diagnosis present

## 2014-09-12 DIAGNOSIS — Z6837 Body mass index (BMI) 37.0-37.9, adult: Secondary | ICD-10-CM | POA: Diagnosis not present

## 2014-09-12 DIAGNOSIS — R7989 Other specified abnormal findings of blood chemistry: Secondary | ICD-10-CM | POA: Diagnosis present

## 2014-09-12 DIAGNOSIS — I5031 Acute diastolic (congestive) heart failure: Secondary | ICD-10-CM | POA: Diagnosis present

## 2014-09-12 DIAGNOSIS — E1165 Type 2 diabetes mellitus with hyperglycemia: Secondary | ICD-10-CM | POA: Diagnosis present

## 2014-09-12 DIAGNOSIS — N183 Chronic kidney disease, stage 3 (moderate): Secondary | ICD-10-CM | POA: Diagnosis present

## 2014-09-12 DIAGNOSIS — D649 Anemia, unspecified: Secondary | ICD-10-CM | POA: Diagnosis present

## 2014-09-12 DIAGNOSIS — N492 Inflammatory disorders of scrotum: Secondary | ICD-10-CM | POA: Diagnosis present

## 2014-09-12 DIAGNOSIS — N049 Nephrotic syndrome with unspecified morphologic changes: Secondary | ICD-10-CM

## 2014-09-12 DIAGNOSIS — Z833 Family history of diabetes mellitus: Secondary | ICD-10-CM

## 2014-09-12 DIAGNOSIS — I129 Hypertensive chronic kidney disease with stage 1 through stage 4 chronic kidney disease, or unspecified chronic kidney disease: Secondary | ICD-10-CM | POA: Diagnosis present

## 2014-09-12 DIAGNOSIS — E669 Obesity, unspecified: Secondary | ICD-10-CM | POA: Diagnosis present

## 2014-09-12 DIAGNOSIS — Z8249 Family history of ischemic heart disease and other diseases of the circulatory system: Secondary | ICD-10-CM

## 2014-09-12 DIAGNOSIS — Z8639 Personal history of other endocrine, nutritional and metabolic disease: Secondary | ICD-10-CM

## 2014-09-12 DIAGNOSIS — I509 Heart failure, unspecified: Secondary | ICD-10-CM

## 2014-09-12 DIAGNOSIS — E559 Vitamin D deficiency, unspecified: Secondary | ICD-10-CM | POA: Diagnosis present

## 2014-09-12 DIAGNOSIS — L03116 Cellulitis of left lower limb: Secondary | ICD-10-CM | POA: Diagnosis present

## 2014-09-12 DIAGNOSIS — L0390 Cellulitis, unspecified: Secondary | ICD-10-CM | POA: Diagnosis present

## 2014-09-12 DIAGNOSIS — I5032 Chronic diastolic (congestive) heart failure: Secondary | ICD-10-CM | POA: Diagnosis present

## 2014-09-12 DIAGNOSIS — R799 Abnormal finding of blood chemistry, unspecified: Secondary | ICD-10-CM

## 2014-09-12 HISTORY — DX: Cellulitis, unspecified: L03.90

## 2014-09-12 HISTORY — DX: Type 2 diabetes mellitus without complications: E11.9

## 2014-09-12 HISTORY — DX: Anemia, unspecified: D64.9

## 2014-09-12 HISTORY — DX: Essential (primary) hypertension: I10

## 2014-09-12 HISTORY — DX: Generalized edema: R60.1

## 2014-09-12 LAB — URINALYSIS, ROUTINE W REFLEX MICROSCOPIC
Bilirubin Urine: NEGATIVE
GLUCOSE, UA: NEGATIVE mg/dL
KETONES UR: NEGATIVE mg/dL
Leukocytes, UA: NEGATIVE
Nitrite: NEGATIVE
PROTEIN: 100 mg/dL — AB
Specific Gravity, Urine: >1.03 — ABNORMAL HIGH (ref 1.005–1.030)
Urobilinogen, UA: 0.2 mg/dL (ref 0.0–1.0)
pH: 5.5 (ref 5.0–8.0)

## 2014-09-12 LAB — COMPREHENSIVE METABOLIC PANEL
ALBUMIN: 2.1 g/dL — AB (ref 3.5–5.2)
ALT: 18 U/L (ref 0–53)
AST: 15 U/L (ref 0–37)
Alkaline Phosphatase: 105 U/L (ref 39–117)
Anion gap: 12 (ref 5–15)
BUN: 61 mg/dL — AB (ref 6–23)
CO2: 21 mEq/L (ref 19–32)
Calcium: 8.2 mg/dL — ABNORMAL LOW (ref 8.4–10.5)
Chloride: 109 mEq/L (ref 96–112)
Creatinine, Ser: 3.25 mg/dL — ABNORMAL HIGH (ref 0.50–1.35)
GFR calc Af Amer: 23 mL/min — ABNORMAL LOW (ref >90)
GFR calc non Af Amer: 20 mL/min — ABNORMAL LOW (ref >90)
Glucose, Bld: 111 mg/dL — ABNORMAL HIGH (ref 70–99)
Potassium: 5.1 mEq/L (ref 3.7–5.3)
Sodium: 142 mEq/L (ref 137–147)
TOTAL PROTEIN: 6.8 g/dL (ref 6.0–8.3)
Total Bilirubin: 0.2 mg/dL — ABNORMAL LOW (ref 0.3–1.2)

## 2014-09-12 LAB — MRSA PCR SCREENING: MRSA by PCR: POSITIVE — AB

## 2014-09-12 LAB — PROTEIN, URINE, RANDOM: Total Protein, Urine: 229 mg/dL

## 2014-09-12 LAB — CBC WITH DIFFERENTIAL/PLATELET
BASOS PCT: 1 % (ref 0–1)
Basophils Absolute: 0 10*3/uL (ref 0.0–0.1)
EOS ABS: 0.2 10*3/uL (ref 0.0–0.7)
Eosinophils Relative: 2 % (ref 0–5)
HCT: 32.3 % — ABNORMAL LOW (ref 39.0–52.0)
Hemoglobin: 9.9 g/dL — ABNORMAL LOW (ref 13.0–17.0)
Lymphocytes Relative: 9 % — ABNORMAL LOW (ref 12–46)
Lymphs Abs: 0.8 10*3/uL (ref 0.7–4.0)
MCH: 27.7 pg (ref 26.0–34.0)
MCHC: 30.7 g/dL (ref 30.0–36.0)
MCV: 90.2 fL (ref 78.0–100.0)
MONO ABS: 0.8 10*3/uL (ref 0.1–1.0)
Monocytes Relative: 10 % (ref 3–12)
NEUTROS PCT: 78 % — AB (ref 43–77)
Neutro Abs: 6.7 10*3/uL (ref 1.7–7.7)
Platelets: 282 10*3/uL (ref 150–400)
RBC: 3.58 MIL/uL — ABNORMAL LOW (ref 4.22–5.81)
RDW: 14.2 % (ref 11.5–15.5)
WBC: 8.5 10*3/uL (ref 4.0–10.5)

## 2014-09-12 LAB — RETICULOCYTES
RBC.: 3.61 MIL/uL — AB (ref 4.22–5.81)
RETIC CT PCT: 2 % (ref 0.4–3.1)
Retic Count, Absolute: 72.2 10*3/uL (ref 19.0–186.0)

## 2014-09-12 LAB — URINE MICROSCOPIC-ADD ON

## 2014-09-12 LAB — I-STAT CG4 LACTIC ACID, ED: Lactic Acid, Venous: 0.81 mmol/L (ref 0.5–2.2)

## 2014-09-12 LAB — GLUCOSE, CAPILLARY: Glucose-Capillary: 88 mg/dL (ref 70–99)

## 2014-09-12 LAB — PRO B NATRIURETIC PEPTIDE: PRO B NATRI PEPTIDE: 15500 pg/mL — AB (ref 0–125)

## 2014-09-12 LAB — CREATININE, URINE, RANDOM: Creatinine, Urine: 93.15 mg/dL

## 2014-09-12 LAB — MAGNESIUM: Magnesium: 2.3 mg/dL (ref 1.5–2.5)

## 2014-09-12 LAB — SODIUM, URINE, RANDOM: Sodium, Ur: 84 mEq/L

## 2014-09-12 LAB — LIPASE, BLOOD: LIPASE: 62 U/L — AB (ref 11–59)

## 2014-09-12 MED ORDER — CLINDAMYCIN PHOSPHATE 300 MG/50ML IV SOLN
300.0000 mg | Freq: Three times a day (TID) | INTRAVENOUS | Status: DC
  Administered 2014-09-12 – 2014-09-13 (×2): 300 mg via INTRAVENOUS
  Filled 2014-09-12 (×6): qty 50

## 2014-09-12 MED ORDER — CLINDAMYCIN PHOSPHATE 300 MG/50ML IV SOLN
INTRAVENOUS | Status: AC
  Filled 2014-09-12: qty 50

## 2014-09-12 MED ORDER — ONDANSETRON HCL 4 MG/2ML IJ SOLN: 4.0000 mg | Freq: Four times a day (QID) | INTRAMUSCULAR | Status: DC | PRN

## 2014-09-12 MED ORDER — SODIUM CHLORIDE 0.9 % IV SOLN: INTRAVENOUS | Status: AC

## 2014-09-12 MED ORDER — SODIUM CHLORIDE 0.9 % IJ SOLN
3.0000 mL | Freq: Two times a day (BID) | INTRAMUSCULAR | Status: DC
  Administered 2014-09-14 – 2014-09-16 (×3): 3 mL via INTRAVENOUS

## 2014-09-12 MED ORDER — ACETAMINOPHEN 650 MG RE SUPP: 650.0000 mg | Freq: Four times a day (QID) | RECTAL | Status: DC | PRN

## 2014-09-12 MED ORDER — SODIUM CHLORIDE 0.9 % IV SOLN
INTRAVENOUS | Status: DC
  Administered 2014-09-12 – 2014-09-14 (×3): via INTRAVENOUS

## 2014-09-12 MED ORDER — PIPERACILLIN-TAZOBACTAM 3.375 G IVPB
3.3750 g | Freq: Three times a day (TID) | INTRAVENOUS | Status: DC
  Administered 2014-09-12 – 2014-09-14 (×5): 3.375 g via INTRAVENOUS
  Filled 2014-09-12 (×15): qty 50

## 2014-09-12 MED ORDER — SODIUM CHLORIDE 0.9 % IV SOLN: 250.0000 mL | INTRAVENOUS | Status: DC | PRN

## 2014-09-12 MED ORDER — OXYCODONE HCL 5 MG PO TABS: 5.0000 mg | ORAL_TABLET | ORAL | Status: DC | PRN

## 2014-09-12 MED ORDER — ONDANSETRON HCL 4 MG PO TABS: 4.0000 mg | ORAL_TABLET | Freq: Four times a day (QID) | ORAL | Status: DC | PRN

## 2014-09-12 MED ORDER — PIPERACILLIN-TAZOBACTAM 3.375 G IVPB
INTRAVENOUS | Status: AC
  Filled 2014-09-12: qty 50

## 2014-09-12 MED ORDER — FUROSEMIDE 10 MG/ML IJ SOLN
60.0000 mg | Freq: Two times a day (BID) | INTRAMUSCULAR | Status: DC
  Administered 2014-09-12 – 2014-09-14 (×5): 60 mg via INTRAVENOUS
  Filled 2014-09-12 (×5): qty 6

## 2014-09-12 MED ORDER — SODIUM CHLORIDE 0.9 % IJ SOLN: 3.0000 mL | INTRAMUSCULAR | Status: DC | PRN

## 2014-09-12 MED ORDER — HEPARIN SODIUM (PORCINE) 5000 UNIT/ML IJ SOLN
5000.0000 [IU] | Freq: Three times a day (TID) | INTRAMUSCULAR | Status: DC
  Administered 2014-09-12 – 2014-09-16 (×12): 5000 [IU] via SUBCUTANEOUS
  Filled 2014-09-12 (×12): qty 1

## 2014-09-12 MED ORDER — ACETAMINOPHEN 325 MG PO TABS: 650.0000 mg | ORAL_TABLET | Freq: Four times a day (QID) | ORAL | Status: DC | PRN

## 2014-09-12 NOTE — ED Notes (Addendum)
Report given to [facility] for room 301. Reported that pt needs a urinalysis when about to urinate on the 3rd floor.

## 2014-09-12 NOTE — Progress Notes (Signed)
Report Received from [facility]. In report, ER stated patient would be transported post shift change.  Report given to night RN and awaiting patient's arrival.

## 2014-09-12 NOTE — ED Notes (Signed)
States he is "retaining water, testicles are swollen and legs are seeping fluid" Pt was seen at Urgent Care and sent to Dr. Romona Curls, who sent pt to ER.

## 2014-09-12 NOTE — H&P (Addendum)
[facility] History and Physical  Wesley [name] [id number] DOB: [birth year] DOA: 09/12/2014  Referring physician: Dr Carmin Muskrat - [facility] PCP: No PCP Per Patient   Chief Complaint: LE and scrotal swelling  HPI: Wesley [name] is a 53 y.o. male  BIlat Le swelling started 2 wks ago. Getting progressively worse. Now involving the scrotum. Now w/ skin discoloration and weeping from the legs. Denies fevers, nausea, CP, SOB, palpitations. Able to sleep lying down Went to Urgent care today who sent pt to Dr Romona Curls and told him to come to [facility]. Minor pain in legs treated w/ ASA w/ benefit.    Review of Systems:  Constitutional:  No weight loss, night sweats, Fevers, chills, fatigue.  HEENT:  No headaches, Difficulty swallowing,Tooth/dental problems,Sore throat,  No sneezing, itching, ear ache, nasal congestion, post nasal drip,  Cardio-vascular:  No chest pain, Orthopnea, PND, dizziness, palpitations  GI:  No heartburn, indigestion, abdominal pain, nausea, vomiting, diarrhea, change in bowel habits, loss of appetite  Resp:  No shortness of breath with exertion or at rest. No excess mucus, no productive cough, No non-productive cough, No coughing up of blood.No change in color of mucus.No wheezing.No chest wall deformity  Skin:  Per HPI GU:  Dark urine. Urinating 4-6+ times daily  Musculoskeletal:  No joint pain or swelling. No decreased range of motion. No back pain.  Psych:  No change in mood or affect. No depression or anxiety. No memory loss.   Past Medical History  Diagnosis Date  . Diabetes mellitus without complication   . Hypertension    Past Surgical History  Procedure Laterality Date  . Incision and drainage of wound  04/03/2012    Procedure: IRRIGATION AND DEBRIDEMENT WOUND;  Surgeon: Scherry Ran, MD;  Location: [facility];  Service: General;  Laterality: Right;   Social History:  reports that he has never smoked. He does not have any smokeless tobacco  history on file. He reports that he drinks alcohol. He reports that he does not use illicit drugs.  No Known Allergies  Family History  Problem Relation Age of Onset  . Cancer - Lung Father   . Congestive Heart Failure Mother   . Diabetes Mellitus II Mother   . Cervical cancer Mother   . Dementia Mother      Prior to Admission medications   Medication Sig Start Date End Date Taking? Authorizing Provider  Pt has not taken any medications regularly since 2014.    Physical Exam: Filed Vitals:   09/12/14 1800 09/12/14 1845 09/12/14 1846 09/12/14 1849  BP:   151/83 151/83  Pulse: 90 91 89 88  Temp:      TempSrc:      Resp: 20 19 22 21   Height:      Weight:      SpO2: 98% 95% 97% 94%    Wt Readings from Last 3 Encounters:  09/12/14 117.935 kg (260 lb)  03/30/12 113.354 kg (249 lb 14.4 oz)    General:  Appears calm and comfortable Eyes:  PERRL, normal lids, irises & conjunctiva ENT:  grossly normal hearing, lips & tongue Neck:  no LAD, masses or thyromegaly Cardiovascular:  RRR, no m/r/g. Pitting edema from the LE to above the umbilicus  Telemetry:  SR, no arrhythmias  Respiratory: Bilateral crackles and ronchi w/ nml effort.  Abdomen: distended, soft, ntnd Skin: diffuse LE adn scrotal erythema and ttp, weeping scrotum and bilat LE, ttp  Musculoskeletal:  grossly normal tone BUE/BLE Psychiatric:  grossly  normal mood and affect, speech fluent and appropriate Neurologic:  grossly non-focal.                Labs on Admission:  Basic Metabolic Panel:  Recent Labs Lab 09/12/14 1607  NA 142  K 5.1  CL 109  CO2 21  GLUCOSE 111*  BUN 61*  CREATININE 3.25*  CALCIUM 8.2*  MG 2.3   Liver Function Tests:  Recent Labs Lab 09/12/14 1607  AST 15  ALT 18  ALKPHOS 105  BILITOT 0.2*  PROT 6.8  ALBUMIN 2.1*    Recent Labs Lab 09/12/14 1607  LIPASE 62*   No results for input(s): AMMONIA in the last 168 hours. CBC:  Recent Labs Lab 09/12/14 1607    WBC 8.5  NEUTROABS 6.7  HGB 9.9*  HCT 32.3*  MCV 90.2  PLT 282   Cardiac Enzymes: No results for input(s): CKTOTAL, CKMB, CKMBINDEX, TROPONINI in the last 168 hours.  BNP (last 3 results)  Recent Labs  09/12/14 1607  PROBNP 15500.0*   CBG: No results for input(s): GLUCAP in the last 168 hours.  Radiological Exams on Admission: Dg Chest Portable 1 View  09/12/2014   CLINICAL DATA:  53 year old male with history of bilateral lower extremity pain and swelling, scrotal swelling and fluid retention for the past 3 weeks. Mild shortness of breath. No chest pain.  EXAM: PORTABLE CHEST - 1 VIEW  COMPARISON:  No priors.  FINDINGS: Lung volumes are low. Film is underpenetrated, limiting the diagnostic sensitivity and specificity of this examination. With these limitations in mind, there is no definite acute consolidative airspace disease. No pleural effusions. Cephalization of the pulmonary vasculature, without frank pulmonary edema. Heart size is moderately enlarged. Upper mediastinal contours are within normal limits.  IMPRESSION: Cardiomegaly with pulmonary venous congestion, but no frank pulmonary edema at this time.   Electronically Signed   By: Vinnie Langton M.D.   On: 09/12/2014 16:50    EKG: Independently reviewed. NSR, no sign of ACS, nonspecific T wave changes.   Assessment/Plan Active Problems:   Obesity   History of diabetes mellitus, type II   Cellulitis   Anasarca   AKI (acute kidney injury)   Heart failure   Normocytic anemia    AKI/ARF: Cr 3.25 on admission BUN 61. Baseline from 2+ years ago around 1. Single elevation so unable to label CKD. Urine output preserved per pt but w/ dark color. Suspect multifactorial etiology prerenal vs CHF vs chronic HTN and diabetes. BUN:Cr ratio, but warrants further workup. Difficult balance between IVF and diuresis. Unlikely ATN. Discussed case w/ Dr. Lowanda Foster who has graceously agreed to see the pt in the am.  - SPEP/UPEP -  FENA - +/- Renal US - ANA, Complement, Hep B, Hep C, ANCA, 24hr protein per Dr. Florentina Addison request - BMET in am (anticipate worsening Cr given diuresis)  Heart failure: no h/o failure. No echo. BNP of 15,500 w/ anasarca. Pt volume overloaded. Unsure of dry weight. No ACEi given renal impairment, no bblocker given initial decompensation. Consider initiating after discharge - 2D Echo w/ contrast - Lasix 60mg  IV BID (discussed w/ renal) - strict i/o - daily wts - Mag, TSH  Cellulitis: weeping LE and scrotum w/ diffuse erythema and skin maceration.  - wound consult - Clinda and zosyn (consider stopping Zosyn if A1c nml)  H/O HTN: pt reports coming offo BP meds more than a year ago as pressures normalized from wt loss and dieting. Normotensive on admission - monitor  DMII: A1c 13.5 in 2013. Pt. Diet controlled DM. CBG at home typically in the low 100s. Not taking metformin - A1c   Anemia: Hgb 9.9. Baseline 9.5. Normocytic. Likely from chronic kidney disease - Anemia panel - cbc in am  Code Status: FULL DVT Prophylaxis: Hep Family Communication: Wife Disposition Plan: Pending improvement   Zacariah Belue, [facility] [email or web address] Password [email or web address]

## 2014-09-12 NOTE — ED Provider Notes (Signed)
CSN: [id number]     Arrival date & time 09/12/14  1536 History   First MD Initiated Contact with Patient 09/12/14 1550     No chief complaint on file.     HPI  Patient presents with concern of increasing bilateral lower extremity edema, scrotal swelling. Symptoms began approximately 2 weeks ago.  Since onset symptoms have been progressive, with increasing discomfort. Patient has some chronic discoloration, swelling about the calves bilaterally, and over the past weeks this has increased, with new weeping lesions on both legs. Patient has particular discomfort about his scrotum, which is distended. No dysuria, hematuria. No fever, chills. No dyspnea, chest pain. Patient has multiple medical issues, including diabetes, hypertension. Patient does not see his physician's frequently. No clear alleviating or exacerbating factors. Patient was at urgent care, sent here for further evaluation.  Past Medical History  Diagnosis Date  . Diabetes mellitus without complication    Past Surgical History  Procedure Laterality Date  . Incision and drainage of wound  04/03/2012    Procedure: IRRIGATION AND DEBRIDEMENT WOUND;  Surgeon: Scherry Ran, MD;  Location: [facility];  Service: General;  Laterality: Right;   No family history on file. History  Substance Use Topics  . Smoking status: Never Smoker   . Smokeless tobacco: Not on file  . Alcohol Use: No    Review of Systems  Constitutional:       Per HPI, otherwise negative  HENT:       Per HPI, otherwise negative  Respiratory:       Per HPI, otherwise negative  Cardiovascular:       Per HPI, otherwise negative  Gastrointestinal: Negative for vomiting.  Endocrine:       Negative aside from HPI  Genitourinary:       Neg aside from HPI   Musculoskeletal:       Per HPI, otherwise negative  Skin: Positive for color change.  Neurological: Negative for syncope.      Allergies  Review of patient's allergies indicates no known  allergies.  Home Medications   Prior to Admission medications   Medication Sig Start Date End Date Taking? Authorizing Provider  doxycycline (VIBRAMYCIN) 50 MG capsule Take 2 capsules (100 mg total) by mouth 2 (two) times daily. 04/11/12   Nimish Luther Parody, MD  hydrochlorothiazide (HYDRODIURIL) 25 MG tablet Take 1 tablet (25 mg total) by mouth daily. 04/11/12 04/11/13  Nimish Luther Parody, MD  lisinopril (PRINIVIL,ZESTRIL) 40 MG tablet Take 1 tablet (40 mg total) by mouth daily. 04/11/12 04/11/13  Nimish Luther Parody, MD  metFORMIN (GLUCOPHAGE) 1000 MG tablet Take 1 tablet (1,000 mg total) by mouth 2 (two) times daily with a meal. 04/11/12 04/11/13  Nimish Luther Parody, MD  metoprolol (LOPRESSOR) 100 MG tablet Take 1 tablet (100 mg total) by mouth 2 (two) times daily. 04/11/12 04/11/13  Nimish C Anastasio Champion, MD   BP 142/60 mmHg  Pulse 95  Temp(Src) 99.3 F (37.4 C) (Oral)  Resp 14  Ht 5\' 10"  (1.778 m)  Wt 260 lb (117.935 kg)  BMI 37.31 kg/m2  SpO2 93% Physical Exam  Constitutional: He is oriented to person, place, and time. He appears well-developed. No distress.  HENT:  Head: Normocephalic and atraumatic.  Eyes: Conjunctivae and EOM are normal.  Cardiovascular: Normal rate and regular rhythm.   Pulmonary/Chest: Effort normal. No stridor. No respiratory distress. He has no wheezes.  Abdominal: He exhibits no distension. There is no tenderness.  Genitourinary:  Distended erythematous scrotum,  no ecchymosis, no drainage, no bleeding.  Tender to palpation  Musculoskeletal: He exhibits no edema.  Neurological: He is alert and oriented to person, place, and time.  Skin: Skin is warm and dry.  There is cobblestoning, indurated skin on the entirety of both calves, with multiple weeping lesions, confluent erythema throughout  Psychiatric: He has a normal mood and affect.  Nursing note and vitals reviewed.   ED Course  Procedures (including critical care time) Labs Review Labs Reviewed  URINE CULTURE   CBC WITH DIFFERENTIAL  COMPREHENSIVE METABOLIC PANEL  PRO B NATRIURETIC PEPTIDE  URINALYSIS, ROUTINE W REFLEX MICROSCOPIC  LIPASE, BLOOD  MAGNESIUM  CBC WITH DIFFERENTIAL  COMPREHENSIVE METABOLIC PANEL  LIPASE, BLOOD  MAGNESIUM  PRO B NATRIURETIC PEPTIDE  I-STAT CG4 LACTIC ACID, ED    Imaging Review Dg Chest Portable 1 View  09/12/2014   CLINICAL DATA:  52 year old male with history of bilateral lower extremity pain and swelling, scrotal swelling and fluid retention for the past 3 weeks. Mild shortness of breath. No chest pain.  EXAM: PORTABLE CHEST - 1 VIEW  COMPARISON:  No priors.  FINDINGS: Lung volumes are low. Film is underpenetrated, limiting the diagnostic sensitivity and specificity of this examination. With these limitations in mind, there is no definite acute consolidative airspace disease. No pleural effusions. Cephalization of the pulmonary vasculature, without frank pulmonary edema. Heart size is moderately enlarged. Upper mediastinal contours are within normal limits.  IMPRESSION: Cardiomegaly with pulmonary venous congestion, but no frank pulmonary edema at this time.   Electronically Signed   By: Vinnie Langton M.D.   On: 09/12/2014 16:50     EKG Interpretation   Date/Time:  Wednesday September 12 2014 16:01:42 EST Ventricular Rate:  94 PR Interval:  176 QRS Duration: 103 QT Interval:  360 QTC Calculation: 450 R Axis:   73 Text Interpretation:  Sinus rhythm Borderline T abnormalities, lateral  leads Sinus rhythm T wave abnormality Abnormal ekg Confirmed by Carmin Muskrat  MD [id number]) on 09/12/2014 4:53:39 PM     Update: Patient ambulatory.  Update: Patient has received 1 L IV fluids. Labs now notable for acute renal failure, as well as elevated proBNP.  Update: Patient and wife aware of all results, including the need for admission given his new acute renal failure. Patient's other lites are generally reassuring. He has started fluid resuscitation, with  greater than 1 L thus far. Second liter is running.  MDM   Final diagnoses:  SOB (shortness of breath)   acute kidney failure Elevated BNP   This patient presents with progression of lower extremity edema, with new scrotal edema. Patient does have erythema on both legs, but has no fever, leukocytosis, and given the symmetry, there is low concern for infection. However, the patient does have evidence for new acute renal failure, as well as elevated BNP suggesting concurrent congestive heart failure. Patient received initial resuscitation in the emergency department with IV fluids.  Patient was admitted for further evaluation and management.   Carmin Muskrat, MD 09/12/14 1900

## 2014-09-12 NOTE — ED Notes (Signed)
PT has weeping wounds to bilateral lower extremities for 2 weeks and pt noticed perineal swelling this week and has some SOB on exertion.

## 2014-09-12 NOTE — Progress Notes (Signed)
ANTIBIOTIC CONSULT NOTE - INITIAL  Pharmacy Consult for Zosyn Indication: cellulitis  No Known Allergies  Patient Measurements: Height: 5\' 10"  (177.8 cm) Weight: 260 lb (117.935 kg) IBW/kg (Calculated) : 73  Vital Signs: Temp: 99.3 F (37.4 C) (12/16 1542) Temp Source: Oral (12/16 1542) BP: 151/83 mmHg (12/16 1849) Pulse Rate: 88 (12/16 1849) Intake/Output from previous day:   Intake/Output from this shift:    Labs:  Recent Labs  09/12/14 1607 09/12/14 1944  WBC 8.5  --   HGB 9.9*  --   PLT 282  --   LABCREA  --  93.15  CREATININE 3.25*  --    Estimated Creatinine Clearance: 33.1 mL/min (by C-G formula based on Cr of 3.25). No results for input(s): VANCOTROUGH, VANCOPEAK, VANCORANDOM, GENTTROUGH, GENTPEAK, GENTRANDOM, TOBRATROUGH, TOBRAPEAK, TOBRARND, AMIKACINPEAK, AMIKACINTROU, AMIKACIN in the last 72 hours.   Microbiology: No results found for this or any previous visit (from the past 720 hour(s)).  Medical History: Past Medical History  Diagnosis Date  . Diabetes mellitus without complication   . Hypertension    Anti-infectives    Start     Dose/Rate Route Frequency Ordered Stop   09/12/14 2200  piperacillin-tazobactam (ZOSYN) IVPB 3.375 g     3.375 g12.5 mL/hr over 240 Minutes Intravenous Every 8 hours 09/12/14 2024     09/12/14 2100  clindamycin (CLEOCIN) IVPB 300 mg     300 mg100 mL/hr over 30 Minutes Intravenous Every 8 hours 09/12/14 2013       Assessment: 53yo male with LE cellulitis and scrotal swelling.  Asked to initiate Zosyn.  Pt also started on Clindamycin.  SCr elevated on admission.  Normalized clcr ~25-82ml/min  Goal of Therapy:  Eradicate infection.  Plan:  Continue Clindamycin per MD (consider dose increase) Zosyn 3.375gm IV q8h, each dose over 4 hrs F/U SCr tomorrow Monitor cultures, renal fxn, and progress  [place] Crane, Yuniel Blaney A 09/12/2014,8:26 PM

## 2014-09-13 ENCOUNTER — Inpatient Hospital Stay (HOSPITAL_COMMUNITY): Payer: BC Managed Care – PPO

## 2014-09-13 ENCOUNTER — Encounter (HOSPITAL_COMMUNITY): Payer: Self-pay | Admitting: Internal Medicine

## 2014-09-13 DIAGNOSIS — I059 Rheumatic mitral valve disease, unspecified: Secondary | ICD-10-CM

## 2014-09-13 DIAGNOSIS — I5033 Acute on chronic diastolic (congestive) heart failure: Secondary | ICD-10-CM

## 2014-09-13 DIAGNOSIS — L03119 Cellulitis of unspecified part of limb: Secondary | ICD-10-CM

## 2014-09-13 LAB — COMPREHENSIVE METABOLIC PANEL
ALBUMIN: 2.1 g/dL — AB (ref 3.5–5.2)
ALT: 15 U/L (ref 0–53)
AST: 15 U/L (ref 0–37)
Alkaline Phosphatase: 98 U/L (ref 39–117)
Anion gap: 11 (ref 5–15)
BUN: 55 mg/dL — ABNORMAL HIGH (ref 6–23)
CO2: 22 mEq/L (ref 19–32)
Calcium: 7.9 mg/dL — ABNORMAL LOW (ref 8.4–10.5)
Chloride: 111 mEq/L (ref 96–112)
Creatinine, Ser: 3.11 mg/dL — ABNORMAL HIGH (ref 0.50–1.35)
GFR calc non Af Amer: 21 mL/min — ABNORMAL LOW (ref >90)
GFR, EST AFRICAN AMERICAN: 24 mL/min — AB (ref >90)
GLUCOSE: 90 mg/dL (ref 70–99)
Potassium: 5.4 mEq/L — ABNORMAL HIGH (ref 3.7–5.3)
Sodium: 144 mEq/L (ref 137–147)
Total Bilirubin: 0.3 mg/dL (ref 0.3–1.2)
Total Protein: 6.4 g/dL (ref 6.0–8.3)

## 2014-09-13 LAB — HEPATITIS C ANTIBODY: HCV AB: NEGATIVE

## 2014-09-13 LAB — LIPID PANEL
CHOL/HDL RATIO: 4.2 ratio
Cholesterol: 127 mg/dL (ref 0–200)
HDL: 30 mg/dL — AB (ref >39)
LDL Cholesterol: 77 mg/dL (ref 0–99)
Triglycerides: 102 mg/dL (ref <150)
VLDL: 20 mg/dL (ref 0–40)

## 2014-09-13 LAB — CBC
HCT: 30.5 % — ABNORMAL LOW (ref 39.0–52.0)
HEMOGLOBIN: 9.2 g/dL — AB (ref 13.0–17.0)
MCH: 27.2 pg (ref 26.0–34.0)
MCHC: 30.2 g/dL (ref 30.0–36.0)
MCV: 90.2 fL (ref 78.0–100.0)
Platelets: 275 10*3/uL (ref 150–400)
RBC: 3.38 MIL/uL — ABNORMAL LOW (ref 4.22–5.81)
RDW: 14.4 % (ref 11.5–15.5)
WBC: 6.9 10*3/uL (ref 4.0–10.5)

## 2014-09-13 LAB — HEMOGLOBIN A1C
HEMOGLOBIN A1C: 8.2 % — AB (ref <5.7)
MEAN PLASMA GLUCOSE: 189 mg/dL — AB (ref <117)

## 2014-09-13 LAB — GLUCOSE, CAPILLARY
GLUCOSE-CAPILLARY: 110 mg/dL — AB (ref 70–99)
GLUCOSE-CAPILLARY: 124 mg/dL — AB (ref 70–99)
GLUCOSE-CAPILLARY: 106 mg/dL — AB (ref 70–99)
Glucose-Capillary: 89 mg/dL (ref 70–99)

## 2014-09-13 LAB — IRON AND TIBC
Iron: 14 ug/dL — ABNORMAL LOW (ref 42–135)
Saturation Ratios: 6 % — ABNORMAL LOW (ref 20–55)
TIBC: 238 ug/dL (ref 215–435)
UIBC: 224 ug/dL (ref 125–400)

## 2014-09-13 LAB — FOLATE: Folate: 10.5 ng/mL

## 2014-09-13 LAB — TSH: TSH: 3.14 u[IU]/mL (ref 0.350–4.500)

## 2014-09-13 LAB — HEPATITIS B SURFACE ANTIBODY,QUALITATIVE: HEP B S AB: NEGATIVE

## 2014-09-13 LAB — MAGNESIUM: Magnesium: 2.1 mg/dL (ref 1.5–2.5)

## 2014-09-13 LAB — FERRITIN: Ferritin: 152 ng/mL (ref 22–322)

## 2014-09-13 LAB — VITAMIN B12: VITAMIN B 12: 762 pg/mL (ref 211–911)

## 2014-09-13 MED ORDER — MUPIROCIN 2 % EX OINT
1.0000 "application " | TOPICAL_OINTMENT | Freq: Two times a day (BID) | CUTANEOUS | Status: DC
  Administered 2014-09-13 – 2014-09-16 (×7): 1 via NASAL
  Filled 2014-09-13: qty 22

## 2014-09-13 MED ORDER — CLINDAMYCIN PHOSPHATE 600 MG/50ML IV SOLN
600.0000 mg | Freq: Three times a day (TID) | INTRAVENOUS | Status: DC
  Administered 2014-09-13 – 2014-09-16 (×10): 600 mg via INTRAVENOUS
  Filled 2014-09-13 (×13): qty 50

## 2014-09-13 MED ORDER — PERFLUTREN LIPID MICROSPHERE
1.0000 mL | INTRAVENOUS | Status: AC | PRN
  Administered 2014-09-13 (×2): 2 mL via INTRAVENOUS
  Administered 2014-09-13: 1 mL via INTRAVENOUS
  Administered 2014-09-13: 2 mL via INTRAVENOUS
  Filled 2014-09-13: qty 10

## 2014-09-13 MED ORDER — CHLORHEXIDINE GLUCONATE CLOTH 2 % EX PADS
6.0000 | MEDICATED_PAD | Freq: Every day | CUTANEOUS | Status: DC
  Administered 2014-09-13 – 2014-09-16 (×4): 6 via TOPICAL

## 2014-09-13 NOTE — Care Management Utilization Note (Signed)
UR complete 

## 2014-09-13 NOTE — Care Management Note (Signed)
    Page 1 of 1   09/13/2014     2:05:55 PM CARE MANAGEMENT NOTE 09/13/2014  Patient:  BENNETT, GRILLEY   Account Number:  [id number]  Date Initiated:  09/13/2014  Documentation initiated by:  Jolene Provost  Subjective/Objective Assessment:   Pt is from home, lives with wife and is independent with ADL's. Pt has no HH serivces, DME's or medication needs prior to admission. Pt does not have PCP.     Action/Plan:   Pt plans to discharge home with self care. Pt given list of PCP's accepting new pt's in the [place] area. Pt has no further CM needs at this time. Will continue to follow.   Anticipated DC Date:  09/15/2014   Anticipated DC Plan:  [place]  CM consult      Choice offered to / List presented to:             Status of service:  Completed, signed off Medicare Important Message given?   (If response is "NO", the following Medicare IM given date fields will be blank) Date Medicare IM given:   Medicare IM given by:   Date Additional Medicare IM given:   Additional Medicare IM given by:    Discharge Disposition:  HOME/SELF CARE  Per UR Regulation:  Reviewed for med. necessity/level of care/duration of stay  If discussed at [place] of Stay Meetings, dates discussed:    Comments:  09/13/2014 [place], RN, MSN, [facility]

## 2014-09-13 NOTE — Consult Note (Addendum)
WOC wound consult note Reason for Consult: Consult requested for bilat legs.  Performed via remote camera with assistance from the bedside nurse for assessment. Pt states he has chronic problems with fluid to his legs, but it became worse this week with some drainage and blistering. Pt states they have improved since antibiotics were started. Wound type:Pt has generalized edema and erythremia to bilat legs.   Measurement: No open wounds to measure at this time;  blisters are high risk to rupture and evolve into partial or full thickness wounds at some point. Wound bed: Generalized moist, yellow fluid filled blistered areas with intact skin to lower legs which have not evolved at this time.   Drainage (amount, consistency, odor) Small amt yellow drainage, no odor Periwound: edema and erythremia is generalized to both legs. Dressing procedure/placement/frequency: Xeroform gauze daily to promote drying and healing of affected areas.  Ace wrap for light compression to decrease edema. Discussed plan of care with patient via remote camera and he verbalized understanding and denied further questions. Please re-consult if further assistance is needed.  Thank-you,  Julien Girt MSN, [place], [place], [place], [place]

## 2014-09-13 NOTE — Progress Notes (Addendum)
[facility] PROGRESS NOTE  Wesley [name] [id number] DOB: May 25, 1961 DOA: 09/12/2014 PCP: No PCP Per Patient  Assessment/Plan: AKI/ARF: Cr 3.25 on admission BUN 61. Baseline from 2+ years ago around 1. Likely multifactorial etiology prerenal vs CHF vs chronic HTN and diabetes. Evaluated by nephrology who opine AKI vs CKD stage 4 progression. Recommend continuing lasix and will follow auto pending labs.  Urine output ok. Difficult balance between IVF and diuresis.  Renal US results pending.  ANA, Complement, Hep B, Hep C, ANCA, 24hr protein in process.  Heart failure: no h/o failure. Await echo. BNP of 15,500 w/ anasarca.  Unsure of dry weight. Volume status -3L. continue Lasix 60mg  IV BID. Strict i/o, daily wts. Mag level within limits of normal. TSH pending  Cellulitis: much less weeping. Appears the worsening appearance of legs related to LE edema vs true worsening of cellulitis. Much improve today compared to photos in ED. Scrotum/penis remain edematous with erythema. No weeping. Evaluated by [place] who recommended light compression with ACE wrap.  Continue Clinda and zosyn day #2. Zosyn renally dosed per pharmacy  H/O HTN: fair control today.  pt reports coming offo BP meds more than a year ago as pressures normalized from wt loss and dieting  DMII: A1c 13.5 in 2013. Pt. Diet controlled DM. Await A1c. CBG range 89-110.  Anemia: Hgb 9.9. Baseline 9.5. Normocytic. Likely from chronic kidney disease. Anemia panel pending.  Code Status: full Family Communication: wife at bedside Disposition Plan: home when ready   Consultants:  Nephrology  [facility]  Procedures:  Echo 09/13/14  Antibiotics:  Zosyn 09/12/14>>  clindaymycin 09/13/15>>  HPI/Subjective: Alert reports feeling better in LE. Scrotal area remains painful.   Objective: Filed Vitals:   09/13/14 1050  BP: 130/60  Pulse: 85  Temp:   Resp: 18    Intake/Output Summary (Last 24 hours) at 09/13/14 1324 Last data  filed at 09/13/14 0435  Gross per 24 hour  Intake      0 ml  Output   3000 ml  Net  -3000 ml   Filed Weights   09/12/14 1542 09/12/14 2039 09/13/14 0500  Weight: 117.935 kg (260 lb) 139.889 kg (308 lb 6.4 oz) 138.392 kg (305 lb 1.6 oz)    Exam:   General:  Obese appears comfortable  Cardiovascular: RRR No MGR bilateral 1-2 pitting edema, edema extends to lower abdomen, chronic vascular changes.   Respiratory: normal effort fine crackles in bases no wheeze  Abdomen: obese somewhat tight +BS non-tender  Musculoskeletal: no clubbing or cyanosis  Skin: LE with chronic venous changes, less weeping today, scrotal area with erythema and edema.    Data Reviewed: Basic Metabolic Panel:  Recent Labs Lab 09/12/14 1607 09/13/14 0520  NA 142 144  K 5.1 5.4*  CL 109 111  CO2 21 22  GLUCOSE 111* 90  BUN 61* 55*  CREATININE 3.25* 3.11*  CALCIUM 8.2* 7.9*  MG 2.3 2.1   Liver Function Tests:  Recent Labs Lab 09/12/14 1607 09/13/14 0520  AST 15 15  ALT 18 15  ALKPHOS 105 98  BILITOT 0.2* 0.3  PROT 6.8 6.4  ALBUMIN 2.1* 2.1*    Recent Labs Lab 09/12/14 1607  LIPASE 62*   No results for input(s): AMMONIA in the last 168 hours. CBC:  Recent Labs Lab 09/12/14 1607 09/13/14 0520  WBC 8.5 6.9  NEUTROABS 6.7  --   HGB 9.9* 9.2*  HCT 32.3* 30.5*  MCV 90.2 90.2  PLT 282 275   Cardiac  Enzymes: No results for input(s): CKTOTAL, CKMB, CKMBINDEX, TROPONINI in the last 168 hours. BNP (last 3 results)  Recent Labs  09/12/14 1607  PROBNP 15500.0*   CBG:  Recent Labs Lab 09/12/14 2048 09/13/14 0810 09/13/14 1133  GLUCAP 88 89 110*    Recent Results (from the past 240 hour(s))  MRSA PCR Screening     Status: Abnormal   Collection Time: 09/12/14  9:08 PM  Result Value Ref Range Status   MRSA by PCR POSITIVE (A) NEGATIVE Final    Comment:        The GeneXpert MRSA Assay (FDA approved for NASAL specimens only), is one component of a comprehensive MRSA  colonization surveillance program. It is not intended to diagnose MRSA infection nor to guide or monitor treatment for MRSA infections. RESULT CALLED TO, READ BACK BY AND VERIFIED WITH: BULLIT,T AT 2400 ON 09/12/2014 BY ISLEY,B      Studies: Dg Chest Portable 1 View  09/12/2014   CLINICAL DATA:  53 year old male with history of bilateral lower extremity pain and swelling, scrotal swelling and fluid retention for the past 3 weeks. Mild shortness of breath. No chest pain.  EXAM: PORTABLE CHEST - 1 VIEW  COMPARISON:  No priors.  FINDINGS: Lung volumes are low. Film is underpenetrated, limiting the diagnostic sensitivity and specificity of this examination. With these limitations in mind, there is no definite acute consolidative airspace disease. No pleural effusions. Cephalization of the pulmonary vasculature, without frank pulmonary edema. Heart size is moderately enlarged. Upper mediastinal contours are within normal limits.  IMPRESSION: Cardiomegaly with pulmonary venous congestion, but no frank pulmonary edema at this time.   Electronically Signed   By: Vinnie Langton M.D.   On: 09/12/2014 16:50    Scheduled Meds: . Chlorhexidine Gluconate Cloth  6 each Topical Q0600  . clindamycin (CLEOCIN) IV  300 mg Intravenous Q8H  . furosemide  60 mg Intravenous BID  . heparin  5,000 Units Subcutaneous 3 times per day  . mupirocin ointment  1 application Nasal BID  . piperacillin-tazobactam (ZOSYN)  IV  3.375 g Intravenous Q8H  . sodium chloride  3 mL Intravenous Q12H   Continuous Infusions: . sodium chloride 125 mL/hr at 09/12/14 1800    Principal Problem:   AKI (acute kidney injury) Active Problems:   Obesity   History of diabetes mellitus, type II   Cellulitis   Anasarca   Heart failure   Normocytic anemia   Elevated brain natriuretic peptide (BNP) level    Time spent: 35 minutes    [facility] Pager [phone]. If 7PM-7AM, please contact night-coverage  at [email or web address], password [facility] 09/13/2014, 1:24 PM  LOS: 1 day     Addendum  I personally saw and evaluated patient on 09/13/2014 and agree with above findings. He is a 53 year old gentleman with a past medical history of diastolic dysfunction undergoing transthoracic echocardiogram on 09/13/2014 that showed preserved ejection fraction with grade 2 diastolic dysfunction, a straight diabetes mellitus, admitted to the medicine service on 09/12/2014. He presented with worsening bilateral extremity edema associated with weeping, erythema, skin discoloration. Last revealed a BNP of 15,500 and creatinine of 3.25 with BUN of 61. Looking back at records he had a creatinine of 1.13 on 04/11/2012. He was started on empiric IV antibiotic therapy for presumed lower extremity cellulitis. Nephrology was consulted multiple labs pending at the time of this dictation. Patient today reported there is decreased weeping from his legs. He has 2+ bilateral extremity pitting edema,  scrotal edema bilateral lower extremity erythema. On lung exam he had bilateral crackles. Will continue Lasix 60 mg IV twice a day for probable acute decompensated congestive heart failure. Will also continue clindamycin 600 mg IV every 8 hours and Zosyn 3.375 g IV every 8 hours for presumed lower extremity cellulitis.[facility] Kitchen Pharmacy consult for antimicrobial dosing.  Nephrology following.

## 2014-09-13 NOTE — Consult Note (Addendum)
Wesley [name] MRN: [id number] DOB/AGE: [birth year] 53 y.o. Primary Care Physician:No PCP Per Patient Admit date: 09/12/2014 Chief Complaint: No chief complaint on file.  HPI: Pt is 53 year old male with past medical hx of DM who presented to Er with c/o LE swelling.  HPI dates back to 2-3 weeks ago when pt started having LE swelling, it was progressive. Pt also started having edema in his scrotum. Pt also c/o weeping from lower ext. Pt offers no c/o chest pain NO c/o fever/cough/chills  no c/o Hematuria  No c/o change in speech/vision Pt does give a hx of snoring , falling sleep while watching TV and being on passenger side of car.    Past Medical History  Diagnosis Date  . Diabetes mellitus without complication   . Hypertension        Family History  Problem Relation Age of Onset  . Cancer - Lung Father   . Congestive Heart Failure Mother   . Diabetes Mellitus II Mother   . Cervical cancer Mother   . Dementia Mother     Social History:  reports that he has never smoked. He does not have any smokeless tobacco history on file. He reports that he drinks alcohol. He reports that he does not use illicit drugs.   Allergies: No Known Allergies  Medications Prior to Admission  Medication Sig Dispense Refill  . doxycycline (VIBRAMYCIN) 50 MG capsule Take 2 capsules (100 mg total) by mouth 2 (two) times daily. (Patient not taking: Reported on 09/12/2014) 120 capsule 0  . hydrochlorothiazide (HYDRODIURIL) 25 MG tablet Take 1 tablet (25 mg total) by mouth daily. (Patient not taking: Reported on 09/12/2014) 30 tablet 0  . lisinopril (PRINIVIL,ZESTRIL) 40 MG tablet Take 1 tablet (40 mg total) by mouth daily. (Patient not taking: Reported on 09/12/2014) 30 tablet 0  . metFORMIN (GLUCOPHAGE) 1000 MG tablet Take 1 tablet (1,000 mg total) by mouth 2 (two) times daily with a meal. (Patient not taking: Reported on 09/12/2014) 60 tablet 0  . metoprolol (LOPRESSOR) 100 MG tablet Take 1  tablet (100 mg total) by mouth 2 (two) times daily. (Patient not taking: Reported on 09/12/2014) 60 tablet 0       [id number] from the symptoms mentioned above,there are no other symptoms referable to all systems reviewed.  . Chlorhexidine Gluconate Cloth  6 each Topical Q0600  . clindamycin (CLEOCIN) IV  300 mg Intravenous Q8H  . furosemide  60 mg Intravenous BID  . heparin  5,000 Units Subcutaneous 3 times per day  . mupirocin ointment  1 application Nasal BID  . piperacillin-tazobactam (ZOSYN)  IV  3.375 g Intravenous Q8H  . sodium chloride  3 mL Intravenous Q12H      Physical Exam: Vital signs in last 24 hours: Temp:  [98.7 F (37.1 C)-99.5 F (37.5 C)] 98.7 F (37.1 C) (12/17 0500) Pulse Rate:  [85-95] 85 (12/17 1050) Resp:  [14-24] 18 (12/17 1050) BP: (114-161)/(60-92) 130/60 mmHg (12/17 1050) SpO2:  [90 %-99 %] 90 % (12/17 0500) FiO2 (%):  [21 %] 21 % (12/16 1957) Weight:  [260 lb (117.935 kg)-308 lb 6.4 oz (139.889 kg)] 305 lb 1.6 oz (138.392 kg) (12/17 0500) Weight change:  Last BM Date: 09/12/14  Intake/Output from previous day: 12/16 0701 - 12/17 0700 In: -  Out: 3000 [Urine:3000]     Physical Exam: General- pt is awake,alert, oriented to time place and person Resp- No acute REsp distress, Decreased bs at bases. CVS- S1S2 regular in rate  and rhythm GIT- BS+, soft, NT, distended+ EXT-2+LE Edema,No Cyanosis,Chronic venous stasis changes, + erythema CNS- CN 2-12 grossly intact. Moving all 4 extremities Psych- normal mood and affect    Lab Results: CBC  Recent Labs  09/12/14 1607 09/13/14 0520  WBC 8.5 6.9  HGB 9.9* 9.2*  HCT 32.3* 30.5*  PLT 282 275    BMET  Recent Labs  09/12/14 1607 09/13/14 0520  NA 142 144  K 5.1 5.4*  CL 109 111  CO2 21 22  GLUCOSE 111* 90  BUN 61* 55*  CREATININE 3.25* 3.11*  CALCIUM 8.2* 7.9*    Trend Creat 2015  3.25=>3.11 2014  1.13   MICRO Recent Results (from the past 240 hour([name]))  MRSA PCR  Screening     Status: Abnormal   Collection Time: 09/12/14  9:08 PM  Result Value Ref Range Status   MRSA by PCR POSITIVE (A) NEGATIVE Final    Comment:        The GeneXpert MRSA Assay (FDA approved for NASAL specimens only), is one component of a comprehensive MRSA colonization surveillance program. It is not intended to diagnose MRSA infection nor to guide or monitor treatment for MRSA infections. RESULT CALLED TO, READ BACK BY AND VERIFIED WITH: BULLIT,T AT 2400 ON 09/12/2014 BY ISLEY,B       Lab Results  Component Value Date   CALCIUM 7.9* 09/13/2014    Urine Spot Prote/crea ratio 229/92= 2.4 grams  CXR Cardiomegaly with pulmonary venous congestion,   Impression: 1)Renal  AKI vs CKD progression                AKI sec to Prerenal/ATN               CKD stage 4.               CKD since 2013?               CKD secondary to Multiple factors                      DM ( hx of uncontrolled DM)                       Sleep apnea                       Cardio renal                        Obesity related                 Progression of CKD rapid, marked with AKI                Proteinura Present  2)HTN Medication- Was On RAS blockers- On Diuretics-Lasix   3)Anemia HGb at goal (9--11) Hx of Iron deficincy  4)CKD Mineral-Bone Disorder PTH not avail Secondary Hyperparathyroidism w/u pending . Vitamin 25-OH will check.  5)Anasraca On IV Lasix   6)Electrolytes Normokalemic NOrmonatremic   7)Acid base Co2 at goal  8) Sleep apnea- high likely hood of sleep apnea    Plan:  Agree with holding metformin Agree with holding HCTZ Agree with Lasix wil ask for CKD work up wil ask for proteinuria work up Will ask for auto immune workup Will suggest to adjust zosyn dosage for GFr Will suggest sleep study as outpt    Wesley [name] 09/13/2014, 10:52 AM

## 2014-09-13 NOTE — Progress Notes (Signed)
  Echocardiogram 2D Echocardiogram has been performed.  [place], [place] 09/13/2014, 11:11 AM

## 2014-09-14 LAB — GLUCOSE, CAPILLARY
Glucose-Capillary: 123 mg/dL — ABNORMAL HIGH (ref 70–99)
Glucose-Capillary: 152 mg/dL — ABNORMAL HIGH (ref 70–99)
Glucose-Capillary: 106 mg/dL — ABNORMAL HIGH (ref 70–99)

## 2014-09-14 LAB — PROTEIN, URINE, 24 HOUR
Collection Interval-UPROT: 24 hours
Protein, 24H Urine: 4816 mg/d — ABNORMAL HIGH (ref 50–100)
Protein, Urine: 86 mg/dL
URINE TOTAL VOLUME-UPROT: 5600 mL

## 2014-09-14 LAB — BASIC METABOLIC PANEL
ANION GAP: 12 (ref 5–15)
BUN: 47 mg/dL — AB (ref 6–23)
CALCIUM: 8.1 mg/dL — AB (ref 8.4–10.5)
CO2: 25 mEq/L (ref 19–32)
CREATININE: 3.25 mg/dL — AB (ref 0.50–1.35)
Chloride: 107 mEq/L (ref 96–112)
GFR, EST AFRICAN AMERICAN: 23 mL/min — AB (ref >90)
GFR, EST NON AFRICAN AMERICAN: 20 mL/min — AB (ref >90)
Glucose, Bld: 128 mg/dL — ABNORMAL HIGH (ref 70–99)
Potassium: 4.9 mEq/L (ref 3.7–5.3)
Sodium: 144 mEq/L (ref 137–147)

## 2014-09-14 LAB — CBC
HEMATOCRIT: 31.2 % — AB (ref 39.0–52.0)
HEMOGLOBIN: 9.4 g/dL — AB (ref 13.0–17.0)
MCH: 27.2 pg (ref 26.0–34.0)
MCHC: 30.1 g/dL (ref 30.0–36.0)
MCV: 90.2 fL (ref 78.0–100.0)
Platelets: 270 10*3/uL (ref 150–400)
RBC: 3.46 MIL/uL — ABNORMAL LOW (ref 4.22–5.81)
RDW: 14.3 % (ref 11.5–15.5)
WBC: 6.8 10*3/uL (ref 4.0–10.5)

## 2014-09-14 LAB — URINE CULTURE: Colony Count: 75000

## 2014-09-14 LAB — SEDIMENTATION RATE: Sed Rate: 80 mm/hr — ABNORMAL HIGH (ref 0–16)

## 2014-09-14 LAB — HEPATITIS PANEL, ACUTE
HCV Ab: NEGATIVE
Hep A IgM: NONREACTIVE
Hep B C IgM: NONREACTIVE
Hepatitis B Surface Ag: NEGATIVE

## 2014-09-14 LAB — ANA: ANA: NEGATIVE

## 2014-09-14 LAB — VITAMIN D 25 HYDROXY (VIT D DEFICIENCY, FRACTURES): Vit D, 25-Hydroxy: 13 ng/mL — ABNORMAL LOW (ref 30–100)

## 2014-09-14 LAB — ANCA SCREEN W REFLEX TITER
Atypical p-ANCA Screen: NEGATIVE
c-ANCA Screen: NEGATIVE
p-ANCA Screen: NEGATIVE

## 2014-09-14 LAB — PHOSPHORUS: Phosphorus: 4.2 mg/dL (ref 2.3–4.6)

## 2014-09-14 MED ORDER — CEPHALEXIN 500 MG PO CAPS
500.0000 mg | ORAL_CAPSULE | Freq: Two times a day (BID) | ORAL | Status: DC
  Administered 2014-09-14: 500 mg via ORAL
  Filled 2014-09-14: qty 1

## 2014-09-14 NOTE — Progress Notes (Signed)
[facility] PROGRESS NOTE  Wesley [name] [id number] DOB: 19-Feb-1961 DOA: 09/12/2014 PCP: No PCP Per Patient  Assessment/Plan: AKI/ARF: creatinine stable. Likely multifactorial etiology prerenal vs CHF vs chronic HTN and diabetes. Evaluated by nephrology who opine AKI vs CKD stage 4 progression. Defer management to nephrology who recommend continuing current regimen. US kidneys with bilateral cortical atrophy. Urine output good.   Acute diastolic heart failure: no h/o failure. Echo with EF 55-60% and grade 2 diastolic dysfunction. Volume status -3.7L. continue Lasix 60mg  IV per nephrology BID. Urine output good.   Cellulitis: continues to improve. Scrotum/penis remain edematous with erythema but improving as well. valuated by [place] who recommended light compression with ACE wrap. Continue Clinda and zosyn day #3.consider narrowing antibiotics.  Zosyn renally dosed per pharmacy  H/O HTN: fair control today.  DMII: A1c 13.5 in 2013. Pt. Diet controlled DM. Await A1c. CBG 123-152.  Anemia: Hgb 9.9. Baseline 9.5. Normocytic. Likely from chronic kidney disease. Anemia panel pending.  Code Status: full Family Communication: wife at bedside Disposition Plan: home when ready   Consultants:  nephrology  Procedures:  none  Antibiotics:  Zosyn 09/12/14>>  clindaymycin 09/13/15>>   HPI/Subjective: Reports feeling "better" with less swelling and easier ambulation easier.  Objective: Filed Vitals:   09/14/14 0658  BP: 150/64  Pulse: 88  Temp: 97.6 F (36.4 C)  Resp: 18    Intake/Output Summary (Last 24 hours) at 09/14/14 1155 Last data filed at 09/14/14 0300  Gross per 24 hour  Intake    560 ml  Output   1300 ml  Net   -740 ml   Filed Weights   09/13/14 0500 09/14/14 0700 09/14/14 1118  Weight: 138.392 kg (305 lb 1.6 oz) 133.902 kg (295 lb 3.2 oz) 134.129 kg (295 lb 11.2 oz)    Exam:   General:  Obese appears comfortable  Cardiovascular: rrr 2+ LE  edema. Edema to up to scrotum   Respiratory: normal effort BS distant but clear  Abdomen: obese soft +BS non-tender to palpation  Musculoskeletal: chronic venous changes pressure dressings intact   Data Reviewed: Basic Metabolic Panel:  Recent Labs Lab 09/12/14 1607 09/13/14 0520 09/14/14 0427  NA 142 144 144  K 5.1 5.4* 4.9  CL 109 111 107  CO2 21 22 25   GLUCOSE 111* 90 128*  BUN 61* 55* 47*  CREATININE 3.25* 3.11* 3.25*  CALCIUM 8.2* 7.9* 8.1*  MG 2.3 2.1  --   PHOS  --   --  4.2   Liver Function Tests:  Recent Labs Lab 09/12/14 1607 09/13/14 0520  AST 15 15  ALT 18 15  ALKPHOS 105 98  BILITOT 0.2* 0.3  PROT 6.8 6.4  ALBUMIN 2.1* 2.1*    Recent Labs Lab 09/12/14 1607  LIPASE 62*   No results for input(s): AMMONIA in the last 168 hours. CBC:  Recent Labs Lab 09/12/14 1607 09/13/14 0520 09/14/14 0427  WBC 8.5 6.9 6.8  NEUTROABS 6.7  --   --   HGB 9.9* 9.2* 9.4*  HCT 32.3* 30.5* 31.2*  MCV 90.2 90.2 90.2  PLT 282 275 270   Cardiac Enzymes: No results for input(s): CKTOTAL, CKMB, CKMBINDEX, TROPONINI in the last 168 hours. BNP (last 3 results)  Recent Labs  09/12/14 1607  PROBNP 15500.0*   CBG:  Recent Labs Lab 09/13/14 1133 09/13/14 1624 09/13/14 2154 09/14/14 0729 09/14/14 1115  GLUCAP 110* 124* 106* 123* 152*    Recent Results (from the past 240 hour(s))  MRSA PCR  Screening     Status: Abnormal   Collection Time: 09/12/14  9:08 PM  Result Value Ref Range Status   MRSA by PCR POSITIVE (A) NEGATIVE Final    Comment:        The GeneXpert MRSA Assay (FDA approved for NASAL specimens only), is one component of a comprehensive MRSA colonization surveillance program. It is not intended to diagnose MRSA infection nor to guide or monitor treatment for MRSA infections. RESULT CALLED TO, READ BACK BY AND VERIFIED WITH: BULLIT,T AT 2400 ON 09/12/2014 BY ISLEY,B      Studies: US Renal  09/13/2014   CLINICAL DATA:  Acute  tubular necrosis, history diabetes, hypertension, anemia, chronic kidney disease stage 4  EXAM: RENAL/URINARY TRACT ULTRASOUND COMPLETE  COMPARISON:  None  FINDINGS: Right Kidney:  Length: 11.2 cm. Cortical thinning. Normal cortical echogenicity. No mass, hydronephrosis or shadowing calcification.  Left Kidney:  Length: 12.3 cm. Much less Less well visualized due to body habitus. Cortical thinning. No gross evidence of mass or hydronephrosis on limited assessment.  Bladder:  Well distended and unremarkable. Ureteral jets were not visualized during the period of imaging.  IMPRESSION: BILATERAL renal cortical atrophy.  No gross evidence of mass or hydronephrosis with limited visualization LEFT kidney as above.   Electronically Signed   By: Lavonia Dana M.D.   On: 09/13/2014 14:08   Dg Chest Portable 1 View  09/12/2014   CLINICAL DATA:  53 year old male with history of bilateral lower extremity pain and swelling, scrotal swelling and fluid retention for the past 3 weeks. Mild shortness of breath. No chest pain.  EXAM: PORTABLE CHEST - 1 VIEW  COMPARISON:  No priors.  FINDINGS: Lung volumes are low. Film is underpenetrated, limiting the diagnostic sensitivity and specificity of this examination. With these limitations in mind, there is no definite acute consolidative airspace disease. No pleural effusions. Cephalization of the pulmonary vasculature, without frank pulmonary edema. Heart size is moderately enlarged. Upper mediastinal contours are within normal limits.  IMPRESSION: Cardiomegaly with pulmonary venous congestion, but no frank pulmonary edema at this time.   Electronically Signed   By: Vinnie Langton M.D.   On: 09/12/2014 16:50    Scheduled Meds: . Chlorhexidine Gluconate Cloth  6 each Topical Q0600  . clindamycin (CLEOCIN) IV  600 mg Intravenous 3 times per day  . furosemide  60 mg Intravenous BID  . heparin  5,000 Units Subcutaneous 3 times per day  . mupirocin ointment  1 application Nasal BID   . piperacillin-tazobactam (ZOSYN)  IV  3.375 g Intravenous Q8H  . sodium chloride  3 mL Intravenous Q12H   Continuous Infusions: . sodium chloride 125 mL/hr at 09/13/14 1417    Principal Problem:   AKI (acute kidney injury) Active Problems:   Obesity   History of diabetes mellitus, type II   Cellulitis   Anasarca   Heart failure   Normocytic anemia   Elevated brain natriuretic peptide (BNP) level    Time spent: [facility] Pager [phone]. If 7PM-7AM, please contact night-coverage at [email or web address], password [facility] 09/14/2014, 11:55 AM  LOS: 2 days     Patient seen and examined. Agree with note listed above. Will discontinue Zosyn.

## 2014-09-14 NOTE — Progress Notes (Signed)
ANTIBIOTIC CONSULT NOTE - follow up  Pharmacy Consult for Zosyn Indication: cellulitis  No Known Allergies  Patient Measurements: Height: 5\' 10"  (177.8 cm) Weight: 295 lb 11.2 oz (134.129 kg) IBW/kg (Calculated) : 73  Vital Signs: Temp: 97.6 F (36.4 C) (12/18 0658) Temp Source: Oral (12/18 0658) BP: 150/64 mmHg (12/18 0658) Pulse Rate: 88 (12/18 0658) Intake/Output from previous day: 12/17 0701 - 12/18 0700 In: 560 [P.O.:560] Out: 1300 [Urine:1300] Intake/Output from this shift:    Labs:  Recent Labs  09/12/14 1607 09/12/14 1944 09/13/14 0520 09/14/14 0427  WBC 8.5  --  6.9 6.8  HGB 9.9*  --  9.2* 9.4*  PLT 282  --  275 270  LABCREA  --  93.15  --   --   CREATININE 3.25*  --  3.11* 3.25*   Estimated Creatinine Clearance: 35.4 mL/min (by C-G formula based on Cr of 3.25). No results for input(s): VANCOTROUGH, VANCOPEAK, VANCORANDOM, GENTTROUGH, GENTPEAK, GENTRANDOM, TOBRATROUGH, TOBRAPEAK, TOBRARND, AMIKACINPEAK, AMIKACINTROU, AMIKACIN in the last 72 hours.   Microbiology: Recent Results (from the past 720 hour(s))  MRSA PCR Screening     Status: Abnormal   Collection Time: 09/12/14  9:08 PM  Result Value Ref Range Status   MRSA by PCR POSITIVE (A) NEGATIVE Final    Comment:        The GeneXpert MRSA Assay (FDA approved for NASAL specimens only), is one component of a comprehensive MRSA colonization surveillance program. It is not intended to diagnose MRSA infection nor to guide or monitor treatment for MRSA infections. RESULT CALLED TO, READ BACK BY AND VERIFIED WITH: BULLIT,T AT 2400 ON 09/12/2014 BY ISLEY,B    Medical History: Past Medical History  Diagnosis Date  . Diabetes mellitus without complication   . Hypertension   . Anasarca   . Cellulitis   . Anemia    Anti-infectives    Start     Dose/Rate Route Frequency Ordered Stop   09/13/14 1400  clindamycin (CLEOCIN) IVPB 600 mg     600 mg100 mL/hr over 30 Minutes Intravenous 3 times per day  09/13/14 1350     09/12/14 2200  piperacillin-tazobactam (ZOSYN) IVPB 3.375 g     3.375 g12.5 mL/hr over 240 Minutes Intravenous Every 8 hours 09/12/14 2024     09/12/14 2100  clindamycin (CLEOCIN) IVPB 300 mg  Status:  Discontinued     300 mg100 mL/hr over 30 Minutes Intravenous Every 8 hours 09/12/14 2013 09/13/14 1350     Assessment: 53yo male with LE cellulitis and scrotal swelling.  Asked to initiate Zosyn.  Pt also started on Clindamycin.  SCr elevated on admission and is being evaluated by nephrology.  Most likely acute on CKD.  Pt is afebrile with normal WBC.  Normalized clcr ~25-63ml/min  Goal of Therapy:  Eradicate infection.  Plan:   Continue Clindamycin per MD   Continue Zosyn 3.375gm IV q8h, each dose over 4 hrs  Consider d/c IV abx and switch to ORAL Clindamycin  Monitor cultures, renal fxn, and progress  [place] Crane, Jarissa Sheriff A 09/14/2014,11:37 AM

## 2014-09-14 NOTE — Plan of Care (Signed)
Problem: Phase I Progression Outcomes Goal: Hemodynamically stable Outcome: Progressing Vital signs stable. Ace wraps to lower extremities

## 2014-09-14 NOTE — Plan of Care (Signed)
Problem: Phase I Progression Outcomes Goal: Pain controlled with appropriate interventions Outcome: Progressing No c/o pain voiced

## 2014-09-14 NOTE — Progress Notes (Signed)
Subjective: Interval History: has no complaint of difficulty in breathing or orthopnea. Patient stated that he is feeling much better. Presently his appetite is good..  Objective: Vital signs in last 24 hours: Temp:  [97.5 F (36.4 C)-98.2 F (36.8 C)] 97.6 F (36.4 C) (12/18 0658) Pulse Rate:  [85-88] 88 (12/18 0658) Resp:  [16-18] 18 (12/18 0658) BP: (149-153)/(64-73) 150/64 mmHg (12/18 0658) SpO2:  [90 %-95 %] 92 % (12/18 0658) Weight:  [133.902 kg (295 lb 3.2 oz)-134.129 kg (295 lb 11.2 oz)] 134.129 kg (295 lb 11.2 oz) (12/18 1118) Weight change: 15.967 kg (35 lb 3.2 oz)  Intake/Output from previous day: 12/17 0701 - 12/18 0700 In: 560 [P.O.:560] Out: 1300 [Urine:1300] Intake/Output this shift:    General appearance: alert, cooperative and no distress Resp: diminished breath sounds posterior - bilateral and rhonchi posterior - bilateral Cardio: regular rate and rhythm, S1, S2 normal, no murmur, click, rub or gallop GI: Distended, nontender and positive bowel sound Extremities: edema 2+ edema and venous stasis dermatitis noted  Lab Results:  Recent Labs  09/13/14 0520 09/14/14 0427  WBC 6.9 6.8  HGB 9.2* 9.4*  HCT 30.5* 31.2*  PLT 275 270   BMET:  Recent Labs  09/13/14 0520 09/14/14 0427  NA 144 144  K 5.4* 4.9  CL 111 107  CO2 22 25  GLUCOSE 90 128*  BUN 55* 47*  CREATININE 3.11* 3.25*  CALCIUM 7.9* 8.1*   No results for input(s): PTH in the last 72 hours. Iron Studies:  Recent Labs  09/13/14 0520  IRON 14*  TIBC 238  FERRITIN 152    Studies/Results: US Renal  09/13/2014   CLINICAL DATA:  Acute tubular necrosis, history diabetes, hypertension, anemia, chronic kidney disease stage 4  EXAM: RENAL/URINARY TRACT ULTRASOUND COMPLETE  COMPARISON:  None  FINDINGS: Right Kidney:  Length: 11.2 cm. Cortical thinning. Normal cortical echogenicity. No mass, hydronephrosis or shadowing calcification.  Left Kidney:  Length: 12.3 cm. Much less Less well  visualized due to body habitus. Cortical thinning. No gross evidence of mass or hydronephrosis on limited assessment.  Bladder:  Well distended and unremarkable. Ureteral jets were not visualized during the period of imaging.  IMPRESSION: BILATERAL renal cortical atrophy.  No gross evidence of mass or hydronephrosis with limited visualization LEFT kidney as above.   Electronically Signed   By: Lavonia Dana M.D.   On: 09/13/2014 14:08   Dg Chest Portable 1 View  09/12/2014   CLINICAL DATA:  53 year old male with history of bilateral lower extremity pain and swelling, scrotal swelling and fluid retention for the past 3 weeks. Mild shortness of breath. No chest pain.  EXAM: PORTABLE CHEST - 1 VIEW  COMPARISON:  No priors.  FINDINGS: Lung volumes are low. Film is underpenetrated, limiting the diagnostic sensitivity and specificity of this examination. With these limitations in mind, there is no definite acute consolidative airspace disease. No pleural effusions. Cephalization of the pulmonary vasculature, without frank pulmonary edema. Heart size is moderately enlarged. Upper mediastinal contours are within normal limits.  IMPRESSION: Cardiomegaly with pulmonary venous congestion, but no frank pulmonary edema at this time.   Electronically Signed   By: Vinnie Langton M.D.   On: 09/12/2014 16:50    I have reviewed the patient's current medications.   Assessment/Plan: Problem #1 renal failure possibly acute on chronic. This could be also secondary to natural progression of his disease. Patient with stage II chronic renal failure in 2013. At this moment there is no blood  work in between. Ultrasound of the kidneys showed bilateral cortical atrophy consistent with chronic renal failure. The etiology could be diabetes/hypertension/ischemic/obesity related. Patient has this moment does not have any uremic signs and symptoms. The present increasing BUN and creatinine as stated above could be because of natural  progression of his disease. However acute kidney injury such as per renal syndrome and ATN cannot be ruled out Problem #2 anasarca: Presently is on Lasix and improving. Etiology as this moment is not clear. Patient has proteinuria but doesn't seem to be nephrotic range. Presently workup for secondary cause of proteinuria and renal failure is pending. Patient had 3 L of urine and his edema is improving. Problem #3 diabetes: Poorly controlled. Patient does not have any follow-up as he didn't have any shortness and no primary care physician. Problem #4 hypertension: His blood pressure seems to be reasonably controlled Problem #5 anemia: Possibly iron deficiency versus anemia of chronic disease Problem #6 obesity Problem #7 cellulitis/venous stasis of his legs. Patient presently on clindamycin and Zosyn. He is a febrile and his white blood cell count is normal. Plan: We'll continue his present management We'll check his iron studies, phosphorus in the morning. We'll check CBC and basic metabolic panel in the morning   LOS: 2 days   Wesley [name] S 09/14/2014,11:19 AM

## 2014-09-14 NOTE — Clinical Documentation Improvement (Signed)
Please specify diagnosis related to below supporting documentation if appropriate.   Possible Clinical Conditions?  Chronic Systolic Congestive Heart Failure Chronic Diastolic Congestive Heart Failure Chronic Systolic & Diastolic Congestive Heart Failure Acute Systolic Congestive Heart Failure Acute Diastolic Congestive Heart Failure Acute Systolic & Diastolic Congestive Heart Failure Acute on Chronic Systolic Congestive Heart Failure Acute on Chronic Diastolic Congestive Heart Failure Acute on Chronic Systolic & Diastolic Congestive Heart Failure Other Condition________________________________________ Cannot Clinically Determine  Supporting Information:  Per 09/13/14 MD progress note = Heart failure.  Patient is a 53 year old gentleman with a past medical history of diastolic dysfunction undergoing transthoracic echocardiogram on 09/13/2014 that showed preserved ejection fraction with grade 2 diastolic dysfunction.     Thank You, Serena Colonel ,RN Clinical Documentation Specialist:  [facility] Information Management

## 2014-09-14 NOTE — Plan of Care (Signed)
Problem: Phase I Progression Outcomes Goal: Dyspnea controlled at rest (HF) Outcome: Progressing No SOB noted.

## 2014-09-15 LAB — BASIC METABOLIC PANEL
Anion gap: 12 (ref 5–15)
BUN: 39 mg/dL — ABNORMAL HIGH (ref 6–23)
CALCIUM: 8.6 mg/dL (ref 8.4–10.5)
CO2: 26 meq/L (ref 19–32)
Chloride: 104 mEq/L (ref 96–112)
Creatinine, Ser: 2.97 mg/dL — ABNORMAL HIGH (ref 0.50–1.35)
GFR calc Af Amer: 26 mL/min — ABNORMAL LOW (ref >90)
GFR calc non Af Amer: 22 mL/min — ABNORMAL LOW (ref >90)
GLUCOSE: 85 mg/dL (ref 70–99)
POTASSIUM: 4.8 meq/L (ref 3.7–5.3)
Sodium: 142 mEq/L (ref 137–147)

## 2014-09-15 LAB — GLUCOSE, CAPILLARY
GLUCOSE-CAPILLARY: 135 mg/dL — AB (ref 70–99)
GLUCOSE-CAPILLARY: 121 mg/dL — AB (ref 70–99)
Glucose-Capillary: 89 mg/dL (ref 70–99)
Glucose-Capillary: 104 mg/dL — ABNORMAL HIGH (ref 70–99)

## 2014-09-15 LAB — CBC
HEMATOCRIT: 32.8 % — AB (ref 39.0–52.0)
Hemoglobin: 9.8 g/dL — ABNORMAL LOW (ref 13.0–17.0)
MCH: 26.9 pg (ref 26.0–34.0)
MCHC: 29.9 g/dL — ABNORMAL LOW (ref 30.0–36.0)
MCV: 90.1 fL (ref 78.0–100.0)
Platelets: 294 10*3/uL (ref 150–400)
RBC: 3.64 MIL/uL — AB (ref 4.22–5.81)
RDW: 14.1 % (ref 11.5–15.5)
WBC: 6.6 10*3/uL (ref 4.0–10.5)

## 2014-09-15 LAB — C3 COMPLEMENT: C3 Complement: 136 mg/dL (ref 90–180)

## 2014-09-15 LAB — C4 COMPLEMENT: Complement C4, Body Fluid: 30 mg/dL (ref 10–40)

## 2014-09-15 LAB — PHOSPHORUS: PHOSPHORUS: 4.5 mg/dL (ref 2.3–4.6)

## 2014-09-15 LAB — COMPLEMENT, TOTAL: Compl, Total (CH50): 60 U/mL (ref 31–60)

## 2014-09-15 MED ORDER — VITAMIN D (ERGOCALCIFEROL) 1.25 MG (50000 UNIT) PO CAPS
50000.0000 [IU] | ORAL_CAPSULE | ORAL | Status: DC
  Administered 2014-09-15: 50000 [IU] via ORAL
  Filled 2014-09-15: qty 1

## 2014-09-15 MED ORDER — TORSEMIDE 20 MG PO TABS
40.0000 mg | ORAL_TABLET | Freq: Every day | ORAL | Status: DC
  Administered 2014-09-15 – 2014-09-16 (×2): 40 mg via ORAL
  Filled 2014-09-15 (×2): qty 2

## 2014-09-15 NOTE — Progress Notes (Signed)
Dressing removed from lower extremities. Small amount bleeding noted when dressing removed from right lower extremities. Xeroform applied to both lower extremities, telfa and wrapped with ace wrap dressing. Lower extremities slightly red.

## 2014-09-15 NOTE — Progress Notes (Signed)
Subjective: Interval History: Patient feeling much better. Denies any difficulty in breathing. The leg swelling isbetter and able to walk yesterday.  Objective: Vital signs in last 24 hours: Temp:  [97.8 F (36.6 C)-98.2 F (36.8 C)] 97.8 F (36.6 C) (12/19 0620) Pulse Rate:  [81-93] 93 (12/19 0620) Resp:  [20] 20 (12/19 0620) BP: (130-159)/(67-89) 159/67 mmHg (12/19 0620) SpO2:  [94 %-96 %] 94 % (12/19 0620) Weight:  [130.046 kg (286 lb 11.2 oz)-134.129 kg (295 lb 11.2 oz)] 130.046 kg (286 lb 11.2 oz) (12/19 0620) Weight change: 0.227 kg (8 oz)  Intake/Output from previous day:   Intake/Output this shift:    General appearance: alert, cooperative and no distress Resp: diminished breath sounds posterior - bilateral and rhonchi posterior - bilateral Cardio: regular rate and rhythm, S1, S2 normal, no murmur, click, rub or gallop GI: Distended, nontender and positive bowel sound Extremities: edema 2+ edema and venous stasis dermatitis noted  Lab Results:  Recent Labs  09/14/14 0427 09/15/14 0606  WBC 6.8 6.6  HGB 9.4* 9.8*  HCT 31.2* 32.8*  PLT 270 294   BMET:   Recent Labs  09/14/14 0427 09/15/14 0606  NA 144 142  K 4.9 4.8  CL 107 104  CO2 25 26  GLUCOSE 128* 85  BUN 47* 39*  CREATININE 3.25* 2.97*  CALCIUM 8.1* 8.6   No results for input(s): PTH in the last 72 hours. Iron Studies:   Recent Labs  09/13/14 0520  IRON 14*  TIBC 238  FERRITIN 152    Studies/Results: US Renal  09/13/2014   CLINICAL DATA:  Acute tubular necrosis, history diabetes, hypertension, anemia, chronic kidney disease stage 4  EXAM: RENAL/URINARY TRACT ULTRASOUND COMPLETE  COMPARISON:  None  FINDINGS: Right Kidney:  Length: 11.2 cm. Cortical thinning. Normal cortical echogenicity. No mass, hydronephrosis or shadowing calcification.  Left Kidney:  Length: 12.3 cm. Much less Less well visualized due to body habitus. Cortical thinning. No gross evidence of mass or hydronephrosis on  limited assessment.  Bladder:  Well distended and unremarkable. Ureteral jets were not visualized during the period of imaging.  IMPRESSION: BILATERAL renal cortical atrophy.  No gross evidence of mass or hydronephrosis with limited visualization LEFT kidney as above.   Electronically Signed   By: Lavonia Dana M.D.   On: 09/13/2014 14:08    I have reviewed the patient's current medications.   Assessment/Plan: Problem #1 renal failure possibly acute on chronic. His renal function is improving Problem#2 CKD: possibly stage II/III . Patient with longstanding diabetes and proteinuria mos . tlikely diabetes. Other possible etiologies can not be ruled out Problem #3 anasarca: Presently is on Lasix and improving.  Problem#4 Proteinuria: Nephrotic range. 4.8 gms. Most likely diabetes. Work up is pending Problem #5 diabetes: Poorly controlled. Patient does not have any follow-up as he didn't have any shortness and no primary care physician. Problem #6 hypertension: His blood pressure seems to be reasonably controlled Problem #7 anemia: Possibly a combination of iron deficiency and anemia of chronic diseases. Patient with low saturation but very high Ferritin Problem #8 obesity Problem #9 cellulitis/venous stasis of his legs. Patient presently on clindamycin and Zosyn. He is a febrile and his white blood cell count is normal Problem # 10 Vitamin D deficiency [id number] Metabolic bone disease his calcium and phosphorus is in frange Plan: D/c Lasix iv 2] Demadex 40 mg po once a day 3] Ergocalciferol 50,ooo iu po once a weel 4]We'll check CBC and basic metabolic panel in  the morning   LOS: 3 days   Reichen Hutzler S 09/15/2014,7:50 AM

## 2014-09-15 NOTE — Plan of Care (Signed)
Problem: Phase II Progression Outcomes Goal: Walk in hall or up in chair TID Outcome: Completed/Met Date Met:  09/15/14 Patient ambulates in hallway     

## 2014-09-15 NOTE — Plan of Care (Signed)
Problem: Phase II Progression Outcomes Goal: Fluid volume status improved Outcome: Progressing Less edema noted lower extremities

## 2014-09-15 NOTE — Progress Notes (Addendum)
[facility] PROGRESS NOTE  Wesley [name] [id number] DOB: [birth year] DOA: 09/12/2014 PCP: No PCP Per Patient  Narrative: 54 year old who presented with lower extremity and scrotal swelling. Was initially placed on Lasix and currently switched by nephrologist to torsemide. Also presented with lower extremity wounds of which are currently being treated. Xeroform has been applied and Ace wrap dressing over both LE.  Assessment/Plan: AKI/ARF: creatinine stable. Likely multifactorial etiology prerenal vs CHF vs chronic HTN and diabetes. Evaluated by nephrology who opine AKI vs CKD stage 4 progression. Defer management to nephrology who recommend continuing current regimen. US kidneys with bilateral cortical atrophy. Urine output good.   Acute diastolic heart failure: no h/o failure. Echo with EF 55-60% and grade 2 diastolic dysfunction. Volume status -3.5L. Patient's lasix d/c and patient placed on toresemide (addendum).  Cellulitis: continues to improve. Scrotum/penis remain edematous with erythema but improving as well. valuated by [place] who recommended light compression with ACE wrap. Continue Clinda day #4. H/O HTN: fair control today.  DMII: A1c 13.5 in 2013. Pt. Diet controlled DM.  - continue diabetic diet  Anemia: Hgb 9.9. Baseline 9.5. Normocytic. Likely from chronic kidney disease. Anemia panel pending.  Code Status: full Family Communication: wife at bedside Disposition Plan: Once cleared for d/c by nephrology   Consultants:  nephrology  Procedures:  none  Antibiotics:  Zosyn 09/12/14>>  clindaymycin 09/13/15>>   HPI/Subjective: Reports continues to report feeling "better" with less swelling and easier ambulation easier.  Objective: Filed Vitals:   09/15/14 0620  BP: 159/67  Pulse: 93  Temp: 97.8 F (36.6 C)  Resp: 20    Intake/Output Summary (Last 24 hours) at 09/15/14 1224 Last data filed at 09/15/14 0940  Gross per 24 hour  Intake    240 ml   Output      0 ml  Net    240 ml   Filed Weights   09/14/14 0700 09/14/14 1118 09/15/14 0620  Weight: 133.902 kg (295 lb 3.2 oz) 134.129 kg (295 lb 11.2 oz) 130.046 kg (286 lb 11.2 oz)    Exam:   General:  Obese appears comfortable  Cardiovascular: rrr 2+ LE edema. Edema to up to scrotum   Respiratory: normal effort BS distant but clear  Abdomen: obese soft +BS non-tender to palpation  Musculoskeletal: chronic venous changes pressure dressings intact   Data Reviewed: Basic Metabolic Panel:  Recent Labs Lab 09/12/14 1607 09/13/14 0520 09/14/14 0427 09/15/14 0606  NA 142 144 144 142  K 5.1 5.4* 4.9 4.8  CL 109 111 107 104  CO2 21 22 25 26   GLUCOSE 111* 90 128* 85  BUN 61* 55* 47* 39*  CREATININE 3.25* 3.11* 3.25* 2.97*  CALCIUM 8.2* 7.9* 8.1* 8.6  MG 2.3 2.1  --   --   PHOS  --   --  4.2 4.5   Liver Function Tests:  Recent Labs Lab 09/12/14 1607 09/13/14 0520  AST 15 15  ALT 18 15  ALKPHOS 105 98  BILITOT 0.2* 0.3  PROT 6.8 6.4  ALBUMIN 2.1* 2.1*    Recent Labs Lab 09/12/14 1607  LIPASE 62*   No results for input(s): AMMONIA in the last 168 hours. CBC:  Recent Labs Lab 09/12/14 1607 09/13/14 0520 09/14/14 0427 09/15/14 0606  WBC 8.5 6.9 6.8 6.6  NEUTROABS 6.7  --   --   --   HGB 9.9* 9.2* 9.4* 9.8*  HCT 32.3* 30.5* 31.2* 32.8*  MCV 90.2 90.2 90.2 90.1  PLT 282 275 270  294   Cardiac Enzymes: No results for input(s): CKTOTAL, CKMB, CKMBINDEX, TROPONINI in the last 168 hours. BNP (last 3 results)  Recent Labs  09/12/14 1607  PROBNP 15500.0*   CBG:  Recent Labs Lab 09/14/14 0729 09/14/14 1115 09/14/14 1645 09/15/14 0729 09/15/14 1117  GLUCAP 123* 152* 106* 89 104*    Recent Results (from the past 240 hour(s))  Urine culture     Status: None   Collection Time: 09/12/14  6:48 PM  Result Value Ref Range Status   Specimen Description URINE, CLEAN CATCH  Final   Special Requests NONE  Final   Culture  Setup Time   Final     09/13/2014 14:14 Performed at [facility]   Final    75,000 COLONIES/ML Performed at [facility]    Culture   Final    Multiple bacterial morphotypes present, none predominant. Suggest appropriate recollection if clinically indicated. Performed at [facility]    Report Status 09/14/2014 FINAL  Final  MRSA PCR Screening     Status: Abnormal   Collection Time: 09/12/14  9:08 PM  Result Value Ref Range Status   MRSA by PCR POSITIVE (A) NEGATIVE Final    Comment:        The GeneXpert MRSA Assay (FDA approved for NASAL specimens only), is one component of a comprehensive MRSA colonization surveillance program. It is not intended to diagnose MRSA infection nor to guide or monitor treatment for MRSA infections. RESULT CALLED TO, READ BACK BY AND VERIFIED WITH: BULLIT,T AT 2400 ON 09/12/2014 BY ISLEY,B      Studies: No results found.  Scheduled Meds: . Chlorhexidine Gluconate Cloth  6 each Topical Q0600  . clindamycin (CLEOCIN) IV  600 mg Intravenous 3 times per day  . heparin  5,000 Units Subcutaneous 3 times per day  . mupirocin ointment  1 application Nasal BID  . sodium chloride  3 mL Intravenous Q12H  . torsemide  40 mg Oral Daily  . Vitamin D (Ergocalciferol)  50,000 Units Oral Q7 days   Continuous Infusions:    Principal Problem:   AKI (acute kidney injury) Active Problems:   Obesity   History of diabetes mellitus, type II   Cellulitis   Anasarca   Heart failure   Normocytic anemia   Elevated brain natriuretic peptide (BNP) level    Time spent: 35 mintues    Wesley [name], [facility]  [facility] Pager [phone] If 7PM-7AM, please contact night-coverage at [email or web address], password [facility] 09/15/2014, 12:24 PM  LOS: 3 days

## 2014-09-15 NOTE — Plan of Care (Signed)
Problem: Phase II Progression Outcomes Goal: Discharge plan established Outcome: Completed/Met Date Met:  09/15/14 Patient going home with wife

## 2014-09-16 DIAGNOSIS — I5032 Chronic diastolic (congestive) heart failure: Secondary | ICD-10-CM | POA: Diagnosis present

## 2014-09-16 DIAGNOSIS — I5031 Acute diastolic (congestive) heart failure: Secondary | ICD-10-CM

## 2014-09-16 DIAGNOSIS — N492 Inflammatory disorders of scrotum: Secondary | ICD-10-CM | POA: Diagnosis present

## 2014-09-16 DIAGNOSIS — N049 Nephrotic syndrome with unspecified morphologic changes: Secondary | ICD-10-CM

## 2014-09-16 LAB — BASIC METABOLIC PANEL
Anion gap: 10 (ref 5–15)
BUN: 36 mg/dL — ABNORMAL HIGH (ref 6–23)
CO2: 28 meq/L (ref 19–32)
Calcium: 8.5 mg/dL (ref 8.4–10.5)
Chloride: 104 mEq/L (ref 96–112)
Creatinine, Ser: 2.74 mg/dL — ABNORMAL HIGH (ref 0.50–1.35)
GFR calc Af Amer: 28 mL/min — ABNORMAL LOW (ref >90)
GFR calc non Af Amer: 24 mL/min — ABNORMAL LOW (ref >90)
Glucose, Bld: 98 mg/dL (ref 70–99)
Potassium: 4.7 mEq/L (ref 3.7–5.3)
SODIUM: 142 meq/L (ref 137–147)

## 2014-09-16 LAB — GLUCOSE, CAPILLARY
Glucose-Capillary: 88 mg/dL (ref 70–99)
Glucose-Capillary: 110 mg/dL — ABNORMAL HIGH (ref 70–99)
Glucose-Capillary: 109 mg/dL — ABNORMAL HIGH (ref 70–99)

## 2014-09-16 LAB — IRON AND TIBC
Iron: 31 ug/dL — ABNORMAL LOW (ref 42–135)
Saturation Ratios: 13 % — ABNORMAL LOW (ref 20–55)
TIBC: 236 ug/dL (ref 215–435)
UIBC: 205 ug/dL (ref 125–400)

## 2014-09-16 LAB — FERRITIN: Ferritin: 142 ng/mL (ref 22–322)

## 2014-09-16 LAB — COMPLEMENT, TOTAL: Compl, Total (CH50): >60 U/mL — ABNORMAL HIGH (ref 31–60)

## 2014-09-16 MED ORDER — LOSARTAN POTASSIUM 50 MG PO TABS
100.0000 mg | ORAL_TABLET | Freq: Every day | ORAL | Status: DC
  Administered 2014-09-16: 100 mg via ORAL
  Filled 2014-09-16: qty 2

## 2014-09-16 MED ORDER — TORSEMIDE 20 MG PO TABS: 40.0000 mg | ORAL_TABLET | Freq: Every day | ORAL | Status: DC

## 2014-09-16 MED ORDER — DOXYCYCLINE HYCLATE 100 MG PO CAPS: 100.0000 mg | ORAL_CAPSULE | Freq: Two times a day (BID) | ORAL | Status: DC

## 2014-09-16 MED ORDER — POLYSACCHARIDE IRON COMPLEX 150 MG PO CAPS
150.0000 mg | ORAL_CAPSULE | Freq: Every day | ORAL | Status: DC
  Administered 2014-09-16: 150 mg via ORAL
  Filled 2014-09-16: qty 1

## 2014-09-16 MED ORDER — POLYSACCHARIDE IRON COMPLEX 150 MG PO CAPS: 150.0000 mg | ORAL_CAPSULE | Freq: Every day | ORAL | Status: DC

## 2014-09-16 MED ORDER — LOSARTAN POTASSIUM 100 MG PO TABS: 100.0000 mg | ORAL_TABLET | Freq: Every day | ORAL | Status: DC

## 2014-09-16 NOTE — Progress Notes (Signed)
Dressings changed to lower extremities. Dr. Roderic Palau in see patients and viewed lower extremities. Open area noted left lower extremity 5x5cm area wound bed pink of left leg. Small amount drainage noted on dressing removed from left leg area cleaned with wound cleaner and dried. Xeroform applied to lower extremities wrapped with gauze and ace wrapped. Saline lock removed. Xeroform dressing given to patient to take home. Home health to be arrange for patient. Face to face, home health order, and face sheet faxed to [facility]. Discharge instruction reviewed with patient and patient's wife. No distress noted.

## 2014-09-16 NOTE — Discharge Summary (Signed)
Physician Discharge Summary  Wesley [name] [id number] DOB: [birth year] DOA: 09/12/2014  PCP: Wesley [name]  Admit date: 09/12/2014 Discharge date: 09/16/2014  Time spent: 40 minutes  Recommendations for Outpatient Follow-up:  1. Follow up with nephrology on 12/24 for repeat basic metabolic panel. 2. He was set up with home health RN.  Discharge Diagnoses:  Principal Problem:   AKI (acute kidney injury) Active Problems:   Obesity   History of diabetes mellitus, type II   Cellulitis   Anasarca   Heart failure   Normocytic anemia   Elevated brain natriuretic peptide (BNP) level   Nephrotic syndrome   Acute diastolic CHF (congestive heart failure)   Cellulitis, scrotum   Discharge Condition:impr0ving  Diet recommendation: low salt  Filed Weights   09/15/14 0620 09/15/14 1339 09/16/14 0500  Weight: 130.046 kg (286 lb 11.2 oz) 129.593 kg (285 lb 11.2 oz) 123.832 kg (273 lb)    History of present illness:  This [name] was admitted to the hospital with bilateral lower extremity swelling as well as associated scrotal edema was noted to have skin discoloration and weeping of his lower extremities. Lab work indicated possible acute kidney injury and acute diastolic congestive heart failure. For his significant weight gain and anasarca. He was admitted to the hospital for further treatments.  Hospital Course:  AKI superimposed on possible chronic kidney disease stage II/III. [name] was followed by nephrology during his hospital course. He was continued on IV fluids and intravenous Lasix. Workup revealed nephrotic range proteinuria. It was felt that this might be related to his chronic hypertension and diabetes. He will follow up with nephrology for further management of this. Currently, his creatinine appears to be improving. He will follow up with nephrology on 12/24 for repeat basic metabolic panel. He's also been started on losartan which should hopefully help with his  proteinuria.  Anasarca, possibly related to hypoalbuminemia from proteinuria, possibly acute diastolic congestive heart failure. [name] received intravenous Lasix during his hospital stay. He has since been transitioned to oral Demadex. He has had significant diuresis and weight loss during his hospital stay. He feels his edema has significantly improved. He continues to have some mild lower extremity edema as well as some scrotal edema, but this is currently improving with oral Demadex. He was strongly advised to observe fluid restriction. This will be further followed up with home health RN. For his lower extremity edema, he also received compression wraps bilaterally. This will be continued in the outpatient setting.  Cellulitis. [name] was found to have significant erythema in the scrotal area which is felt to be possible cellulitis. He was started on antibiotics and will be transitioned to oral doxycycline. Large component of this is likely related to his edema.  Procedures:    Consultations:  nephrology  Discharge Exam: Filed Vitals:   09/16/14 0500  BP: 148/69  Pulse: 90  Temp: 99.3 F (37.4 C)  Resp:     General: NAD Cardiovascular: s1, s2, rrr Respiratory: CTA B  Discharge Instructions   Discharge Instructions    Call MD for:  temperature >100.4    Complete by:  As directed      Diet - low sodium heart healthy    Complete by:  As directed      Increase activity slowly    Complete by:  As directed           Discharge Medication List as of 09/16/2014  6:25 PM    START taking these medications  Details  iron polysaccharides (NIFEREX) 150 MG capsule Take 1 capsule (150 mg total) by mouth daily., Starting 09/16/2014, Until Discontinued, Print    losartan (COZAAR) 100 MG tablet Take 1 tablet (100 mg total) by mouth daily., Starting 09/16/2014, Until Discontinued, Print      CONTINUE these medications which have CHANGED   Details  doxycycline (VIBRAMYCIN) 100  MG capsule Take 1 capsule (100 mg total) by mouth 2 (two) times daily., Starting 09/16/2014, Until Discontinued, Print    torsemide (DEMADEX) 20 MG tablet Take 2 tablets (40 mg total) by mouth daily., Starting 09/16/2014, Until Discontinued, Print      STOP taking these medications     hydrochlorothiazide (HYDRODIURIL) 25 MG tablet      lisinopril (PRINIVIL,ZESTRIL) 40 MG tablet      metFORMIN (GLUCOPHAGE) 1000 MG tablet      metoprolol (LOPRESSOR) 100 MG tablet        Wesley Known Allergies Follow-up Information    Follow up with [facility], MD In 4 weeks.   Specialty:  Nephrology   Contact information:   [address] [address] [phone]       Follow up with [facility], MD.   Specialty:  Nephrology   Why:  For basic metabolic panel   Contact information:   [address] [address] [phone]        The results of significant diagnostics from this hospitalization (including imaging, microbiology, ancillary and laboratory) are listed below for reference.    Significant Diagnostic Studies: US Renal  09/13/2014   CLINICAL DATA:  Acute tubular necrosis, history diabetes, hypertension, anemia, chronic kidney disease stage 4  EXAM: RENAL/URINARY TRACT ULTRASOUND COMPLETE  COMPARISON:  None  FINDINGS: Right Kidney:  Length: 11.2 cm. Cortical thinning. Normal cortical echogenicity. Wesley mass, hydronephrosis or shadowing calcification.  Left Kidney:  Length: 12.3 cm. Much less Less well visualized due to body habitus. Cortical thinning. Wesley gross evidence of mass or hydronephrosis on limited assessment.  Bladder:  Well distended and unremarkable. Ureteral jets were not visualized during the period of imaging.  IMPRESSION: BILATERAL renal cortical atrophy.  Wesley gross evidence of mass or hydronephrosis with limited visualization LEFT kidney as above.   Electronically Signed   By: Lavonia Dana M.D.   On: 09/13/2014 14:08   Dg  Chest Portable 1 View  09/12/2014   CLINICAL DATA:  53 year old male with history of bilateral lower extremity pain and swelling, scrotal swelling and fluid retention for the past 3 weeks. Mild shortness of breath. Wesley chest pain.  EXAM: PORTABLE CHEST - 1 VIEW  COMPARISON:  Wesley priors.  FINDINGS: Lung volumes are low. Film is underpenetrated, limiting the diagnostic sensitivity and specificity of this examination. With these limitations in mind, there is Wesley definite acute consolidative airspace disease. Wesley pleural effusions. Cephalization of the pulmonary vasculature, without frank pulmonary edema. Heart size is moderately enlarged. Upper mediastinal contours are within normal limits.  IMPRESSION: Cardiomegaly with pulmonary venous congestion, but Wesley frank pulmonary edema at this time.   Electronically Signed   By: Vinnie Langton M.D.   On: 09/12/2014 16:50    Microbiology: Recent Results (from the past 240 hour(s))  Urine culture     Status: None   Collection Time: 09/12/14  6:48 PM  Result Value Ref Range Status   Specimen Description URINE, CLEAN CATCH  Final   Special Requests NONE  Final   Culture  Setup Time   Final  09/13/2014 14:14 Performed at [facility]   Final    75,000 COLONIES/ML Performed at [facility]   Final    Multiple bacterial morphotypes present, none predominant. Suggest appropriate recollection if clinically indicated. Performed at [facility]    Report Status 09/14/2014 FINAL  Final  MRSA PCR Screening     Status: Abnormal   Collection Time: 09/12/14  9:08 PM  Result Value Ref Range Status   MRSA by PCR POSITIVE (A) NEGATIVE Final    Comment:        The GeneXpert MRSA Assay (FDA approved for NASAL specimens only), is one component of a comprehensive MRSA colonization surveillance program. It is not intended to diagnose MRSA infection nor to guide or monitor treatment for MRSA infections. RESULT  CALLED TO, READ BACK BY AND VERIFIED WITH: BULLIT,T AT 2400 ON 09/12/2014 BY ISLEY,B      Labs: Basic Metabolic Panel:  Recent Labs Lab 09/12/14 1607 09/13/14 0520 09/14/14 0427 09/15/14 0606 09/16/14 0643  NA 142 144 144 142 142  K 5.1 5.4* 4.9 4.8 4.7  CL 109 111 107 104 104  CO2 21 22 25 26 28   GLUCOSE 111* 90 128* 85 98  BUN 61* 55* 47* 39* 36*  CREATININE 3.25* 3.11* 3.25* 2.97* 2.74*  CALCIUM 8.2* 7.9* 8.1* 8.6 8.5  MG 2.3 2.1  --   --   --   PHOS  --   --  4.2 4.5  --    Liver Function Tests:  Recent Labs Lab 09/12/14 1607 09/13/14 0520  AST 15 15  ALT 18 15  ALKPHOS 105 98  BILITOT 0.2* 0.3  PROT 6.8 6.4  ALBUMIN 2.1* 2.1*    Recent Labs Lab 09/12/14 1607  LIPASE 62*   Wesley results for input(s): AMMONIA in the last 168 hours. CBC:  Recent Labs Lab 09/12/14 1607 09/13/14 0520 09/14/14 0427 09/15/14 0606  WBC 8.5 6.9 6.8 6.6  NEUTROABS 6.7  --   --   --   HGB 9.9* 9.2* 9.4* 9.8*  HCT 32.3* 30.5* 31.2* 32.8*  MCV 90.2 90.2 90.2 90.1  PLT 282 275 270 294   Cardiac Enzymes: Wesley results for input(s): CKTOTAL, CKMB, CKMBINDEX, TROPONINI in the last 168 hours. BNP: BNP (last 3 results)  Recent Labs  09/12/14 1607  PROBNP 15500.0*   CBG:  Recent Labs Lab 09/15/14 1708 09/15/14 2102 09/16/14 0715 09/16/14 1141 09/16/14 1642  GLUCAP 135* 121* 88 110* 109*       Signed:  Doloras [place]  [facility] 09/16/2014, 7:52 PM

## 2014-09-16 NOTE — Progress Notes (Signed)
Subjective: Interval History: Patient offers no complaint. Appetite is good and no difficulty in breathing  Objective: Vital signs in last 24 hours: Temp:  [97.9 F (36.6 C)-99.3 F (37.4 C)] 99.3 F (37.4 C) (12/20 0500) Pulse Rate:  [87-90] 90 (12/20 0500) BP: (134-159)/(63-74) 148/69 mmHg (12/20 0500) SpO2:  [91 %-92 %] 91 % (12/20 0500) Weight:  [123.832 kg (273 lb)-129.593 kg (285 lb 11.2 oz)] 123.832 kg (273 lb) (12/20 0500) Weight change: -4.536 kg (-10 lb)  Intake/Output from previous day: 12/19 0701 - 12/20 0700 In: 720 [P.O.:720] Out: 500 [Urine:500] Intake/Output this shift:  generally patient is alert and in no apparent distress. Chest: Clear to auscultation, no rales rhonchi or egophony His heart examination regular rate and rhythm no murmur Extremities he has 1+ bilateral edema.    Lab Results:  Recent Labs  09/14/14 0427 09/15/14 0606  WBC 6.8 6.6  HGB 9.4* 9.8*  HCT 31.2* 32.8*  PLT 270 294   BMET:   Recent Labs  09/15/14 0606 09/16/14 0643  NA 142 142  K 4.8 4.7  CL 104 104  CO2 26 28  GLUCOSE 85 98  BUN 39* 36*  CREATININE 2.97* 2.74*  CALCIUM 8.6 8.5   No results for input(s): PTH in the last 72 hours. Iron Studies:  No results for input(s): IRON, TIBC, TRANSFERRIN, FERRITIN in the last 72 hours.  Studies/Results: No results found.  I have reviewed the patient's current medications.   Assessment/Plan: Problem #1 renal failure possibly acute on chronic. His renal function is progressively improving. His creatinine today is 2.74. Problem#2 CKD: possibly stage II/III . Patient with longstanding diabetes/hypertension/ischemic Problem #3 anasarca: Presently patient is on Demadex. His urine output is poorly documented. Presently patient weight has decreased from 139 kg to 123 kg and loss of 16 kg. Hence patient has lost significant amount of fluid. Problem#4 Proteinuria: Nephrotic range. 4.8 gms. This seems to be secondary to diabetes  versus obesity related proteinuria. His workup including ANA, hepatitis B surface antigen, hepatitis C antibody is negative. Complement is normal. Follow workup remaining his 24-hour urine for protein and immunoelectrophoresis. Problem #5 diabetes: Poorly controlled. Patient will have a primary care physician when he is discharged. Problem #6 hypertension: His blood pressure seems to be reasonably controlled Problem #7 anemia: Possibly a combination of iron deficiency and anemia of chronic diseases. His hemoglobin and hematocrit is stable. Problem #8 obesity Problem #9 cellulitis/venous stasis of his legs. Problem # 10 Vitamin D deficiency [id number] Metabolic bone disease his calcium and phosphorus is in range Plan: we'll continue with Demadex Patient advised to decrease his salt and fluid intake We'll start patient on Nu-Iron 150 mg by mouth once a day We'll start patient on Cozaar 100 mg by mouth daily We'll check his basic metabolic panel next week and if his renal function is stable we'll continue his Cozaar. We'll see patient as an outpatient if his discharge. 4]We'll check CBC and basic metabolic panel in the morning   LOS: 4 days   Mia Milan S 09/16/2014,8:53 AM

## 2014-09-16 NOTE — Progress Notes (Signed)
Patient ambulated in hallway tolerated well.

## 2014-09-17 LAB — GLOMERULAR BASEMENT MEMBRANE ANTIBODIES: GBM Ab: <1

## 2014-09-17 LAB — KAPPA/LAMBDA LIGHT CHAINS
Kappa free light chain: 10.5 mg/dL — ABNORMAL HIGH (ref 0.33–1.94)
Kappa, lambda light chain ratio: 2.13 — ABNORMAL HIGH (ref 0.26–1.65)
Lambda free light chains: 4.92 mg/dL — ABNORMAL HIGH (ref 0.57–2.63)

## 2014-09-17 LAB — ANCA SCREEN W REFLEX TITER
Atypical p-ANCA Screen: NEGATIVE
c-ANCA Screen: NEGATIVE
p-ANCA Screen: NEGATIVE

## 2014-09-17 LAB — MPO/PR-3 (ANCA) ANTIBODIES
Myeloperoxidase Abs: <1
Serine Protease 3: <1

## 2014-09-17 LAB — PTH, INTACT AND CALCIUM
Calcium, Total (PTH): 7.9 mg/dL — ABNORMAL LOW (ref 8.4–10.5)
PTH: 223 pg/mL — ABNORMAL HIGH (ref 14–64)

## 2014-09-17 LAB — ANA: Anti Nuclear Antibody(ANA): NEGATIVE

## 2014-09-17 LAB — ANTI-DNA ANTIBODY, DOUBLE-STRANDED: ds DNA Ab: <1 IU/mL

## 2014-09-18 LAB — PROTEIN ELECTROPHORESIS, SERUM
Albumin ELP: 36.9 % — ABNORMAL LOW (ref 55.8–66.1)
Alpha-1-Globulin: 9.5 % — ABNORMAL HIGH (ref 2.9–4.9)
Alpha-2-Globulin: 17.3 % — ABNORMAL HIGH (ref 7.1–11.8)
Beta 2: 7.5 % — ABNORMAL HIGH (ref 3.2–6.5)
Beta Globulin: 6.8 % (ref 4.7–7.2)
Gamma Globulin: 22 % — ABNORMAL HIGH (ref 11.1–18.8)
M-Spike, %: NOT DETECTED g/dL
Total Protein ELP: 6.1 g/dL (ref 6.0–8.3)

## 2014-09-18 LAB — UIFE/LIGHT CHAINS/TP QN, 24-HR UR
Albumin, U: DETECTED
Alpha 1, Urine: DETECTED — AB
Alpha 2, Urine: DETECTED — AB
Beta, Urine: DETECTED — AB
Gamma Globulin, Urine: DETECTED — AB
Time: 24 hours
Total Protein, Urine-Ur/day: 6664 mg/d — ABNORMAL HIGH (ref <150)
Total Protein, Urine: 119 mg/dL — ABNORMAL HIGH (ref 5–25)
Volume, Urine: 5600 mL

## 2014-09-18 LAB — IMMUNOFIXATION ELECTROPHORESIS
IgA: 166 mg/dL (ref 68–379)
IgG (Immunoglobin G), Serum: 1130 mg/dL (ref 650–1600)
IgM, Serum: 51 mg/dL — ABNORMAL LOW (ref 41–251)
Total Protein ELP: 6.1 g/dL (ref 6.0–8.3)

## 2014-09-18 NOTE — Care Management Utilization Note (Signed)
UR complete 

## 2014-12-07 ENCOUNTER — Other Ambulatory Visit (HOSPITAL_COMMUNITY): Payer: Self-pay | Admitting: Nurse Practitioner

## 2014-12-07 DIAGNOSIS — R0989 Other specified symptoms and signs involving the circulatory and respiratory systems: Secondary | ICD-10-CM

## 2014-12-12 ENCOUNTER — Ambulatory Visit (HOSPITAL_COMMUNITY)
Admission: RE | Admit: 2014-12-12 | Discharge: 2014-12-12 | Disposition: A | Discharge status: Home / Self Care | Payer: BLUE CROSS/BLUE SHIELD | Source: Ambulatory Visit | Attending: Nurse Practitioner | Admitting: Nurse Practitioner

## 2014-12-12 DIAGNOSIS — L089 Local infection of the skin and subcutaneous tissue, unspecified: Secondary | ICD-10-CM | POA: Insufficient documentation

## 2014-12-12 DIAGNOSIS — Z01818 Encounter for other preprocedural examination: Secondary | ICD-10-CM | POA: Insufficient documentation

## 2014-12-12 DIAGNOSIS — R0989 Other specified symptoms and signs involving the circulatory and respiratory systems: Secondary | ICD-10-CM

## 2015-01-22 ENCOUNTER — Ambulatory Visit: Payer: BLUE CROSS/BLUE SHIELD | Admitting: Cardiology

## 2015-01-29 ENCOUNTER — Other Ambulatory Visit (HOSPITAL_COMMUNITY)
Admission: RE | Admit: 2015-01-29 | Discharge: 2015-01-29 | Disposition: A | Discharge status: Home / Self Care | Payer: BLUE CROSS/BLUE SHIELD | Source: Ambulatory Visit | Attending: Nurse Practitioner | Admitting: Nurse Practitioner

## 2015-01-29 DIAGNOSIS — Z029 Encounter for administrative examinations, unspecified: Secondary | ICD-10-CM | POA: Insufficient documentation

## 2015-01-30 LAB — PTH, INTACT AND CALCIUM
CALCIUM TOTAL (PTH): 8.7 mg/dL (ref 8.7–10.2)
PTH: 141 pg/mL — AB (ref 15–65)

## 2017-12-17 ENCOUNTER — Inpatient Hospital Stay (HOSPITAL_COMMUNITY)
Admission: EM | Admit: 2017-12-17 | Discharge: 2017-12-21 | DRG: 683 | Disposition: A | Discharge status: Home / Self Care | Payer: Commercial Managed Care - PPO | Attending: Internal Medicine | Admitting: Internal Medicine

## 2017-12-17 ENCOUNTER — Emergency Department (HOSPITAL_COMMUNITY): Payer: Commercial Managed Care - PPO

## 2017-12-17 ENCOUNTER — Encounter (HOSPITAL_COMMUNITY): Payer: Self-pay | Admitting: *Deleted

## 2017-12-17 ENCOUNTER — Other Ambulatory Visit: Payer: Self-pay

## 2017-12-17 DIAGNOSIS — Z8639 Personal history of other endocrine, nutritional and metabolic disease: Secondary | ICD-10-CM

## 2017-12-17 DIAGNOSIS — I5032 Chronic diastolic (congestive) heart failure: Secondary | ICD-10-CM | POA: Diagnosis present

## 2017-12-17 DIAGNOSIS — I509 Heart failure, unspecified: Secondary | ICD-10-CM

## 2017-12-17 DIAGNOSIS — Z801 Family history of malignant neoplasm of trachea, bronchus and lung: Secondary | ICD-10-CM

## 2017-12-17 DIAGNOSIS — N179 Acute kidney failure, unspecified: Secondary | ICD-10-CM | POA: Diagnosis not present

## 2017-12-17 DIAGNOSIS — E872 Acidosis, unspecified: Secondary | ICD-10-CM | POA: Diagnosis present

## 2017-12-17 DIAGNOSIS — I10 Essential (primary) hypertension: Secondary | ICD-10-CM | POA: Diagnosis present

## 2017-12-17 DIAGNOSIS — N19 Unspecified kidney failure: Secondary | ICD-10-CM | POA: Diagnosis not present

## 2017-12-17 DIAGNOSIS — Z833 Family history of diabetes mellitus: Secondary | ICD-10-CM

## 2017-12-17 DIAGNOSIS — E1122 Type 2 diabetes mellitus with diabetic chronic kidney disease: Secondary | ICD-10-CM | POA: Diagnosis present

## 2017-12-17 DIAGNOSIS — I130 Hypertensive heart and chronic kidney disease with heart failure and stage 1 through stage 4 chronic kidney disease, or unspecified chronic kidney disease: Secondary | ICD-10-CM | POA: Diagnosis present

## 2017-12-17 DIAGNOSIS — N184 Chronic kidney disease, stage 4 (severe): Secondary | ICD-10-CM

## 2017-12-17 DIAGNOSIS — Z8049 Family history of malignant neoplasm of other genital organs: Secondary | ICD-10-CM

## 2017-12-17 DIAGNOSIS — D509 Iron deficiency anemia, unspecified: Secondary | ICD-10-CM | POA: Diagnosis present

## 2017-12-17 DIAGNOSIS — Z6841 Body Mass Index (BMI) 40.0 and over, adult: Secondary | ICD-10-CM

## 2017-12-17 DIAGNOSIS — Z79899 Other long term (current) drug therapy: Secondary | ICD-10-CM

## 2017-12-17 DIAGNOSIS — E669 Obesity, unspecified: Secondary | ICD-10-CM | POA: Diagnosis present

## 2017-12-17 DIAGNOSIS — G4730 Sleep apnea, unspecified: Secondary | ICD-10-CM | POA: Diagnosis present

## 2017-12-17 DIAGNOSIS — N2581 Secondary hyperparathyroidism of renal origin: Secondary | ICD-10-CM | POA: Diagnosis present

## 2017-12-17 DIAGNOSIS — D638 Anemia in other chronic diseases classified elsewhere: Secondary | ICD-10-CM | POA: Diagnosis present

## 2017-12-17 DIAGNOSIS — Z8249 Family history of ischemic heart disease and other diseases of the circulatory system: Secondary | ICD-10-CM

## 2017-12-17 HISTORY — DX: Disorder of kidney and ureter, unspecified: N28.9

## 2017-12-17 LAB — CBC WITH DIFFERENTIAL/PLATELET
Basophils Absolute: 0.1 10*3/uL (ref 0.0–0.1)
Basophils Relative: 1 %
EOS PCT: 2 %
Eosinophils Absolute: 0.2 10*3/uL (ref 0.0–0.7)
HCT: 25.3 % — ABNORMAL LOW (ref 39.0–52.0)
HEMOGLOBIN: 7.7 g/dL — AB (ref 13.0–17.0)
LYMPHS ABS: 1.4 10*3/uL (ref 0.7–4.0)
LYMPHS PCT: 16 %
MCH: 27.3 pg (ref 26.0–34.0)
MCHC: 30.4 g/dL (ref 30.0–36.0)
MCV: 89.7 fL (ref 78.0–100.0)
Monocytes Absolute: 0.4 10*3/uL (ref 0.1–1.0)
Monocytes Relative: 5 %
Neutro Abs: 6.6 10*3/uL (ref 1.7–7.7)
Neutrophils Relative %: 76 %
Platelets: 215 10*3/uL (ref 150–400)
RBC: 2.82 MIL/uL — ABNORMAL LOW (ref 4.22–5.81)
RDW: 13.5 % (ref 11.5–15.5)
WBC: 8.6 10*3/uL (ref 4.0–10.5)

## 2017-12-17 LAB — COMPREHENSIVE METABOLIC PANEL
ALK PHOS: 76 U/L (ref 38–126)
ALT: 14 U/L — AB (ref 17–63)
AST: 12 U/L — ABNORMAL LOW (ref 15–41)
Albumin: 2.8 g/dL — ABNORMAL LOW (ref 3.5–5.0)
Anion gap: 15 (ref 5–15)
BUN: 108 mg/dL — ABNORMAL HIGH (ref 6–20)
CALCIUM: 6.2 mg/dL — AB (ref 8.9–10.3)
CO2: 17 mmol/L — ABNORMAL LOW (ref 22–32)
CREATININE: 7.07 mg/dL — AB (ref 0.61–1.24)
Chloride: 111 mmol/L (ref 101–111)
GFR calc Af Amer: 9 mL/min — ABNORMAL LOW (ref >60)
GFR, EST NON AFRICAN AMERICAN: 8 mL/min — AB (ref >60)
Glucose, Bld: 105 mg/dL — ABNORMAL HIGH (ref 65–99)
Potassium: 4.4 mmol/L (ref 3.5–5.1)
Sodium: 143 mmol/L (ref 135–145)
Total Bilirubin: 0.4 mg/dL (ref 0.3–1.2)
Total Protein: 6.8 g/dL (ref 6.5–8.1)

## 2017-12-17 MED ORDER — FUROSEMIDE 10 MG/ML IJ SOLN
80.0000 mg | Freq: Once | INTRAMUSCULAR | Status: AC
  Administered 2017-12-18: 80 mg via INTRAVENOUS
  Filled 2017-12-17: qty 8

## 2017-12-17 NOTE — ED Notes (Signed)
Critical result. Calcium 6.2. Dr. Rogene Houston notifed.

## 2017-12-17 NOTE — ED Provider Notes (Signed)
[facility] EMERGENCY DEPARTMENT Provider Note   CSN: [id number] Arrival date & time: 12/17/17  1930     History   Chief Complaint Chief Complaint  Patient presents with  . abnormal labs    HPI Trelyn Vanderlinde is a 57 y.o. male.  Patient sent by PCP with abnormal blood work.  States he was having a normal follow-up in his kidney function was elevated.  He has known CKD and follows with Dr. Hinda Lenis.  He states his creatinine is in the 2-3 range and he is not on dialysis.  Today his creatinine was found to be greater than 7.  Patient states he feels well and denies any change in his urine output.  Denies any redness of breath, cough, fever, chest pain.  He does have some minimal leg swelling.  He states there was no discussion about him starting dialysis yet.  States compliance with his home diuretics.  The history is provided by the patient.    Past Medical History:  Diagnosis Date  . Anasarca   . Anemia   . Cellulitis   . Diabetes mellitus without complication ([place])   . Hypertension   . Renal disorder     Patient Active Problem List   Diagnosis Date Noted  . Nephrotic syndrome 09/16/2014  . Acute diastolic CHF (congestive heart failure) ([place]) 09/16/2014  . Cellulitis, scrotum 09/16/2014  . History of diabetes mellitus, type II 09/12/2014  . Cellulitis 09/12/2014  . Anasarca 09/12/2014  . AKI (acute kidney injury) ([place]) 09/12/2014  . Heart failure ([place]) 09/12/2014  . Normocytic anemia 09/12/2014  . Elevated brain natriuretic peptide (BNP) level   . Anemia of chronic disease 04/01/2012  . Cellulitis and abscess of foot 03/30/2012  . Diabetes mellitus ([place]) 03/30/2012  . HTN (hypertension) 03/30/2012  . Obesity 03/30/2012  . Leukocytosis 03/30/2012    Past Surgical History:  Procedure Laterality Date  . INCISION AND DRAINAGE OF WOUND  04/03/2012   Procedure: IRRIGATION AND DEBRIDEMENT WOUND;  Surgeon: Scherry Ran, MD;  Location: [facility];  Service: General;   Laterality: Right;        Home Medications    Prior to Admission medications   Medication Sig Start Date End Date Taking? Authorizing Provider  doxycycline (VIBRAMYCIN) 100 MG capsule Take 1 capsule (100 mg total) by mouth 2 (two) times daily. 09/16/14   Kathie Dike, MD  iron polysaccharides (NIFEREX) 150 MG capsule Take 1 capsule (150 mg total) by mouth daily. 09/16/14   Kathie Dike, MD  losartan (COZAAR) 100 MG tablet Take 1 tablet (100 mg total) by mouth daily. 09/16/14   Kathie Dike, MD  torsemide (DEMADEX) 20 MG tablet Take 2 tablets (40 mg total) by mouth daily. 09/16/14   Kathie Dike, MD    Family History Family History  Problem Relation Age of Onset  . Cancer - Lung Father   . Congestive Heart Failure Mother   . Diabetes Mellitus II Mother   . Cervical cancer Mother   . Dementia Mother     Social History Social History   Tobacco Use  . Smoking status: Never Smoker  . Smokeless tobacco: Never Used  Substance Use Topics  . Alcohol use: Not Currently    Alcohol/week: 0.0 oz    Comment: rarely  . Drug use: No     Allergies   Patient has no known allergies.   Review of Systems Review of Systems  Constitutional: Negative for activity change, appetite change and fever.  HENT: Negative  for congestion and mouth sores.   Eyes: Negative for visual disturbance.  Respiratory: Positive for shortness of breath. Negative for cough and chest tightness.   Cardiovascular: Positive for leg swelling. Negative for chest pain.  Gastrointestinal: Negative for abdominal pain, nausea and vomiting.  Genitourinary: Negative for dysuria and hematuria.  Musculoskeletal: Negative for arthralgias and myalgias.  Neurological: Negative for dizziness, weakness and headaches.   all other systems are negative except as noted in the HPI and PMH.     Physical Exam Updated Vital Signs BP (!) 162/84 (BP Location: Right Arm)   Pulse 92   Temp 98.6 F (37 C) (Oral)    Resp 18   Ht 5\' 10"  (1.778 m)   Wt 124.7 kg (275 lb)   SpO2 96%   BMI 39.46 kg/m   Physical Exam  Constitutional: He is oriented to person, place, and time. He appears well-developed and well-nourished. No distress.  HENT:  Head: Normocephalic and atraumatic.  Mouth/Throat: Oropharynx is clear and moist. No oropharyngeal exudate.  Eyes: Pupils are equal, round, and reactive to light. Conjunctivae and EOM are normal.  Neck: Normal range of motion. Neck supple.  No meningismus.  Cardiovascular: Normal rate, regular rhythm, normal heart sounds and intact distal pulses.  No murmur heard. Pulmonary/Chest: Effort normal. No respiratory distress. He has rales. He exhibits no tenderness.  Crackles at bases  Abdominal: Soft. There is no tenderness. There is no rebound and no guarding.  Musculoskeletal: Normal range of motion. He exhibits edema. He exhibits no tenderness.  Chronic venous stasis changes.  +1 pretibial edema  Neurological: He is alert and oriented to person, place, and time. No cranial nerve deficit. He exhibits normal muscle tone. Coordination normal.  No ataxia on finger to nose bilaterally. No pronator drift. 5/5 strength throughout. CN 2-12 intact.Equal grip strength. Sensation intact.   Skin: Skin is warm. Capillary refill takes less than 2 seconds.  Psychiatric: He has a normal mood and affect. His behavior is normal.  Nursing note and vitals reviewed.    ED Treatments / Results  Labs (all labs ordered are listed, but only abnormal results are displayed) Labs Reviewed  CBC WITH DIFFERENTIAL/PLATELET - Abnormal; Notable for the following components:      Result Value   RBC 2.82 (*)    Hemoglobin 7.7 (*)    HCT 25.3 (*)    All other components within normal limits  COMPREHENSIVE METABOLIC PANEL - Abnormal; Notable for the following components:   CO2 17 (*)    Glucose, Bld 105 (*)    BUN 108 (*)    Creatinine, Ser 7.07 (*)    Calcium 6.2 (*)    Albumin 2.8 (*)      AST 12 (*)    ALT 14 (*)    GFR calc non Af Amer 8 (*)    GFR calc Af Amer 9 (*)    All other components within normal limits  URINALYSIS, ROUTINE W REFLEX MICROSCOPIC - Abnormal; Notable for the following components:   Color, Urine STRAW (*)    Glucose, UA 50 (*)    Hgb urine dipstick SMALL (*)    Protein, ur 100 (*)    Bacteria, UA RARE (*)    Squamous Epithelial / LPF 0-5 (*)    All other components within normal limits  PROTEIN / CREATININE RATIO, URINE - Abnormal; Notable for the following components:   Protein Creatinine Ratio 4.91 (*)    All other components within normal limits  CBG MONITORING, ED - Abnormal; Notable for the following components:   Glucose-Capillary 128 (*)    All other components within normal limits  SODIUM, URINE, RANDOM  CREATININE, URINE, RANDOM  UREA NITROGEN, URINE  VITAMIN B12  FOLATE  IRON AND TIBC  FERRITIN  RETICULOCYTES  HIV ANTIBODY (ROUTINE TESTING)  BASIC METABOLIC PANEL  TYPE AND SCREEN    EKG EKG Interpretation  Date/Time:  Friday December 17 2017 23:42:20 EDT Ventricular Rate:  88 PR Interval:    QRS Duration: 105 QT Interval:  400 QTC Calculation: 484 R Axis:   38 Text Interpretation:  Sinus rhythm Probable left atrial enlargement Nonspecific T abnormalities, lateral leads Borderline prolonged QT interval No significant change was found Confirmed by Ezequiel Essex [id number]) on 12/17/2017 11:52:05 PM   Radiology Dg Chest 2 View  Result Date: 12/17/2017 CLINICAL DATA:  Shortness of breath with exertion EXAM: CHEST - 2 VIEW COMPARISON:  09/12/2014 FINDINGS: Cardiomegaly with vascular congestion. No pleural effusion or focal consolidation. Aortic atherosclerosis. No pneumothorax. IMPRESSION: Cardiomegaly with vascular congestion Electronically Signed   By: Donavan Foil M.D.   On: 12/17/2017 23:59    Procedures Procedures (including critical care time)  Medications Ordered in ED Medications - No data to display   Initial  Impression / Assessment and Plan / ED Course  I have reviewed the triage vital signs and the nursing notes.  Pertinent labs & imaging results that were available during my care of the patient were reviewed by me and considered in my medical decision making (see chart for details).    Patient sent by PCP with acute on chronic renal failure.  States he is still urinating and denies symptoms.  He is in no distress.  Creatinine elevated worse than baseline.  Patient with some pulmonary edema on x-ray but no respiratory distress or hypoxia.  He is given IV Lasix in the ED.  Nephrology not available tonight.  Potassium is 4.4.  No indication for emergent dialysis tonight.  Discussed with Dr. Justin Mend of nephrology who agrees with diuresis and feels patient can be seen by nephrology in the morning and does not need transfer to [facility].  Patient given IV Lasix.  His metabolic acidosis is likely due to his renal failure.  Signs of heart failure on exam as well given his crackles on exam and lower extremity edema.  Plan admission for IV diuresis and nephrology consult in the morning.  Discussed with Dr. Myna Hidalgo. Final Clinical Impressions(s) / ED Diagnoses   Final diagnoses:  Acute kidney injury [facility])  Uremia    ED Discharge Orders    None       Daelen Belvedere, Annie Main, MD 12/18/17 [phone]

## 2017-12-17 NOTE — ED Triage Notes (Signed)
Pt reports he was told he "could" be in stage 5 renal failure. Pt was told by Dr. Hinda Lenis to come to the hospital to be admitted. Pt reports some sob.

## 2017-12-18 ENCOUNTER — Inpatient Hospital Stay (HOSPITAL_COMMUNITY): Payer: Commercial Managed Care - PPO

## 2017-12-18 ENCOUNTER — Encounter (HOSPITAL_COMMUNITY): Payer: Self-pay | Admitting: Family Medicine

## 2017-12-18 DIAGNOSIS — N170 Acute kidney failure with tubular necrosis: Secondary | ICD-10-CM

## 2017-12-18 DIAGNOSIS — Z79899 Other long term (current) drug therapy: Secondary | ICD-10-CM | POA: Diagnosis not present

## 2017-12-18 DIAGNOSIS — N184 Chronic kidney disease, stage 4 (severe): Secondary | ICD-10-CM | POA: Diagnosis present

## 2017-12-18 DIAGNOSIS — E872 Acidosis, unspecified: Secondary | ICD-10-CM | POA: Diagnosis present

## 2017-12-18 DIAGNOSIS — Z8639 Personal history of other endocrine, nutritional and metabolic disease: Secondary | ICD-10-CM | POA: Diagnosis not present

## 2017-12-18 DIAGNOSIS — E669 Obesity, unspecified: Secondary | ICD-10-CM | POA: Diagnosis present

## 2017-12-18 DIAGNOSIS — Z801 Family history of malignant neoplasm of trachea, bronchus and lung: Secondary | ICD-10-CM | POA: Diagnosis not present

## 2017-12-18 DIAGNOSIS — Z6841 Body Mass Index (BMI) 40.0 and over, adult: Secondary | ICD-10-CM | POA: Diagnosis not present

## 2017-12-18 DIAGNOSIS — G4730 Sleep apnea, unspecified: Secondary | ICD-10-CM | POA: Diagnosis present

## 2017-12-18 DIAGNOSIS — I10 Essential (primary) hypertension: Secondary | ICD-10-CM

## 2017-12-18 DIAGNOSIS — Z833 Family history of diabetes mellitus: Secondary | ICD-10-CM | POA: Diagnosis not present

## 2017-12-18 DIAGNOSIS — N2581 Secondary hyperparathyroidism of renal origin: Secondary | ICD-10-CM | POA: Diagnosis present

## 2017-12-18 DIAGNOSIS — N179 Acute kidney failure, unspecified: Secondary | ICD-10-CM | POA: Diagnosis present

## 2017-12-18 DIAGNOSIS — D509 Iron deficiency anemia, unspecified: Secondary | ICD-10-CM | POA: Diagnosis present

## 2017-12-18 DIAGNOSIS — D638 Anemia in other chronic diseases classified elsewhere: Secondary | ICD-10-CM | POA: Diagnosis present

## 2017-12-18 DIAGNOSIS — N19 Unspecified kidney failure: Secondary | ICD-10-CM | POA: Diagnosis present

## 2017-12-18 DIAGNOSIS — I130 Hypertensive heart and chronic kidney disease with heart failure and stage 1 through stage 4 chronic kidney disease, or unspecified chronic kidney disease: Secondary | ICD-10-CM | POA: Diagnosis present

## 2017-12-18 DIAGNOSIS — Z8249 Family history of ischemic heart disease and other diseases of the circulatory system: Secondary | ICD-10-CM | POA: Diagnosis not present

## 2017-12-18 DIAGNOSIS — E1122 Type 2 diabetes mellitus with diabetic chronic kidney disease: Secondary | ICD-10-CM | POA: Diagnosis present

## 2017-12-18 DIAGNOSIS — I5032 Chronic diastolic (congestive) heart failure: Secondary | ICD-10-CM | POA: Diagnosis present

## 2017-12-18 DIAGNOSIS — Z8049 Family history of malignant neoplasm of other genital organs: Secondary | ICD-10-CM | POA: Diagnosis not present

## 2017-12-18 DIAGNOSIS — N178 Other acute kidney failure: Secondary | ICD-10-CM | POA: Diagnosis not present

## 2017-12-18 LAB — URINALYSIS, ROUTINE W REFLEX MICROSCOPIC
BILIRUBIN URINE: NEGATIVE
Glucose, UA: 50 mg/dL — AB
Ketones, ur: NEGATIVE mg/dL
Leukocytes, UA: NEGATIVE
NITRITE: NEGATIVE
Protein, ur: 100 mg/dL — AB
RBC / HPF: NONE SEEN RBC/hpf (ref 0–5)
Specific Gravity, Urine: 1.01 (ref 1.005–1.030)
pH: 5 (ref 5.0–8.0)

## 2017-12-18 LAB — CBG MONITORING, ED
Glucose-Capillary: 128 mg/dL — ABNORMAL HIGH (ref 65–99)
Glucose-Capillary: 103 mg/dL — ABNORMAL HIGH (ref 65–99)

## 2017-12-18 LAB — BASIC METABOLIC PANEL
Anion gap: 15 (ref 5–15)
BUN: 85 mg/dL — AB (ref 6–20)
CO2: 17 mmol/L — ABNORMAL LOW (ref 22–32)
Calcium: 6.3 mg/dL — CL (ref 8.9–10.3)
Chloride: 110 mmol/L (ref 101–111)
Creatinine, Ser: 6.97 mg/dL — ABNORMAL HIGH (ref 0.61–1.24)
GFR calc Af Amer: 9 mL/min — ABNORMAL LOW (ref >60)
GFR, EST NON AFRICAN AMERICAN: 8 mL/min — AB (ref >60)
GLUCOSE: 112 mg/dL — AB (ref 65–99)
POTASSIUM: 3.9 mmol/L (ref 3.5–5.1)
Sodium: 142 mmol/L (ref 135–145)

## 2017-12-18 LAB — FERRITIN: Ferritin: 95 ng/mL (ref 24–336)

## 2017-12-18 LAB — IRON AND TIBC
IRON: 38 ug/dL — AB (ref 45–182)
Saturation Ratios: 13 % — ABNORMAL LOW (ref 17.9–39.5)
TIBC: 291 ug/dL (ref 250–450)
UIBC: 253 ug/dL

## 2017-12-18 LAB — PROTEIN / CREATININE RATIO, URINE
CREATININE, URINE: 48.9 mg/dL
PROTEIN CREATININE RATIO: 4.91 mg/mg{creat} — AB (ref 0.00–0.15)
Total Protein, Urine: 240 mg/dL

## 2017-12-18 LAB — TYPE AND SCREEN
ABO/RH(D): A POS
ANTIBODY SCREEN: POSITIVE

## 2017-12-18 LAB — VITAMIN B12: VITAMIN B 12: 458 pg/mL (ref 180–914)

## 2017-12-18 LAB — SODIUM, URINE, RANDOM: SODIUM UR: 89 mmol/L

## 2017-12-18 LAB — RETICULOCYTES
RBC.: 2.78 MIL/uL — ABNORMAL LOW (ref 4.22–5.81)
Retic Count, Absolute: 38.9 10*3/uL (ref 19.0–186.0)
Retic Ct Pct: 1.4 % (ref 0.4–3.1)

## 2017-12-18 LAB — FOLATE: Folate: 12.9 ng/mL (ref >5.9)

## 2017-12-18 LAB — MRSA PCR SCREENING: MRSA by PCR: NEGATIVE

## 2017-12-18 LAB — GLUCOSE, CAPILLARY
GLUCOSE-CAPILLARY: 215 mg/dL — AB (ref 65–99)
GLUCOSE-CAPILLARY: 112 mg/dL — AB (ref 65–99)
Glucose-Capillary: 117 mg/dL — ABNORMAL HIGH (ref 65–99)

## 2017-12-18 LAB — CREATININE, URINE, RANDOM: Creatinine, Urine: 48.76 mg/dL

## 2017-12-18 MED ORDER — SODIUM CHLORIDE 0.9 % IV SOLN: 250.0000 mL | INTRAVENOUS | Status: DC | PRN

## 2017-12-18 MED ORDER — ACETAMINOPHEN 650 MG RE SUPP: 650.0000 mg | Freq: Four times a day (QID) | RECTAL | Status: DC | PRN

## 2017-12-18 MED ORDER — HYDRALAZINE HCL 20 MG/ML IJ SOLN: 10.0000 mg | INTRAMUSCULAR | Status: DC | PRN

## 2017-12-18 MED ORDER — SODIUM CHLORIDE 0.9% FLUSH
3.0000 mL | Freq: Two times a day (BID) | INTRAVENOUS | Status: DC
  Administered 2017-12-18 – 2017-12-21 (×5): 3 mL via INTRAVENOUS

## 2017-12-18 MED ORDER — INSULIN ASPART 100 UNIT/ML ~~LOC~~ SOLN: 0.0000 [IU] | Freq: Every day | SUBCUTANEOUS | Status: DC

## 2017-12-18 MED ORDER — ACETAMINOPHEN 325 MG PO TABS: 650.0000 mg | ORAL_TABLET | Freq: Four times a day (QID) | ORAL | Status: DC | PRN

## 2017-12-18 MED ORDER — SODIUM CHLORIDE 0.9 % IV SOLN
1.0000 g | Freq: Once | INTRAVENOUS | Status: AC
  Administered 2017-12-18: 1 g via INTRAVENOUS
  Filled 2017-12-18: qty 10

## 2017-12-18 MED ORDER — FUROSEMIDE 10 MG/ML IJ SOLN: 60.0000 mg | Freq: Two times a day (BID) | INTRAMUSCULAR | Status: DC

## 2017-12-18 MED ORDER — INSULIN ASPART 100 UNIT/ML ~~LOC~~ SOLN
0.0000 [IU] | Freq: Three times a day (TID) | SUBCUTANEOUS | Status: DC
  Administered 2017-12-18: 3 [IU] via SUBCUTANEOUS
  Administered 2017-12-19: 1 [IU] via SUBCUTANEOUS

## 2017-12-18 MED ORDER — SODIUM BICARBONATE 650 MG PO TABS
650.0000 mg | ORAL_TABLET | Freq: Two times a day (BID) | ORAL | Status: DC
  Administered 2017-12-18 – 2017-12-19 (×4): 650 mg via ORAL
  Filled 2017-12-18 (×9): qty 1

## 2017-12-18 MED ORDER — SODIUM CHLORIDE 0.9 % IV SOLN
INTRAVENOUS | Status: DC
  Administered 2017-12-18 – 2017-12-20 (×4): via INTRAVENOUS

## 2017-12-18 MED ORDER — ONDANSETRON HCL 4 MG/2ML IJ SOLN: 4.0000 mg | Freq: Four times a day (QID) | INTRAMUSCULAR | Status: DC | PRN

## 2017-12-18 MED ORDER — HYDROCODONE-ACETAMINOPHEN 5-325 MG PO TABS
1.0000 | ORAL_TABLET | ORAL | Status: DC | PRN
  Administered 2017-12-20: 2 via ORAL
  Filled 2017-12-18: qty 2

## 2017-12-18 MED ORDER — HEPARIN SODIUM (PORCINE) 5000 UNIT/ML IJ SOLN
5000.0000 [IU] | Freq: Three times a day (TID) | INTRAMUSCULAR | Status: DC
  Administered 2017-12-18 – 2017-12-20 (×8): 5000 [IU] via SUBCUTANEOUS
  Filled 2017-12-18 (×9): qty 1

## 2017-12-18 MED ORDER — SODIUM CHLORIDE 0.9% FLUSH: 3.0000 mL | INTRAVENOUS | Status: DC | PRN

## 2017-12-18 MED ORDER — ONDANSETRON HCL 4 MG PO TABS: 4.0000 mg | ORAL_TABLET | Freq: Four times a day (QID) | ORAL | Status: DC | PRN

## 2017-12-18 NOTE — H&P (Signed)
History and Physical    Wesley [name] ONG:[id number] DOB: [birth year] DOA: 12/17/2017  PCP: Arsenio Katz, NP   Patient coming from: Home  Chief Complaint: Abnormal labs   HPI: Wesley [name] is a 57 y.o. male with medical history significant for chronic kidney disease stage IV, type 2 diabetes mellitus, and hypertension, now presenting to the emergency department for evaluation of abnormal blood work.  Patient has been following with nephrology for his chronic kidney disease and reports that his creatinine had been stable in the mid 2 range.  He had blood work drawn yesterday and was told to present to the emergency department for evaluation of renal failure.  He denies any change in urination, recent vomiting or diarrhea, or recent medication changes.  Denies melena or hematochezia.  ED Course: Upon arrival to the ED, patient is found to be afebrile, saturating well on room air, slightly hypertensive, and vitals otherwise stable.  EKG features a sinus rhythm with nonspecific T wave abnormality.  Chest x-ray is notable for cardiomegaly and vascular congestion.  Chemistry panel reveals a bicarbonate of 17, calcium 6.2, albumin 2.8, BUN 108, and creatinine of 7.07.  CBC features a normocytic anemia with hemoglobin of 7.7, down from 12.3 in 2017.  Nephrology was consulted by the ED physician and recommended diuresis and medical admission to [facility] with local nephrologist to be consulted in the morning.  Patient remains hemodynamically stable, in no acute distress, and will be admitted to the telemetry unit for ongoing evaluation and management of acute renal failure superimposed on chronic kidney disease stage IV.  Review of Systems:  All other systems reviewed and apart from HPI, are negative.  Past Medical History:  Diagnosis Date  . Anasarca   . Anemia   . Cellulitis   . Diabetes mellitus without complication ([place])   . Hypertension   . Renal disorder     Past Surgical  History:  Procedure Laterality Date  . INCISION AND DRAINAGE OF WOUND  04/03/2012   Procedure: IRRIGATION AND DEBRIDEMENT WOUND;  Surgeon: Scherry Ran, MD;  Location: [facility];  Service: General;  Laterality: Right;     reports that he has never smoked. He has never used smokeless tobacco. He reports that he drank alcohol. He reports that he does not use drugs.  No Known Allergies  Family History  Problem Relation Age of Onset  . Cancer - Lung Father   . Congestive Heart Failure Mother   . Diabetes Mellitus II Mother   . Cervical cancer Mother   . Dementia Mother      Prior to Admission medications   Medication Sig Start Date End Date Taking? Authorizing Provider  iron polysaccharides (NIFEREX) 150 MG capsule Take 1 capsule (150 mg total) by mouth daily. 09/16/14   Kathie Dike, MD  losartan (COZAAR) 100 MG tablet Take 1 tablet (100 mg total) by mouth daily. 09/16/14   Kathie Dike, MD  torsemide (DEMADEX) 20 MG tablet Take 2 tablets (40 mg total) by mouth daily. 09/16/14   Kathie Dike, MD    Physical Exam: Vitals:   12/17/17 1957 12/17/17 1959  BP: (!) 162/84   Pulse: 92   Resp: 18   Temp: 98.6 F (37 C)   TempSrc: Oral   SpO2: 96%   Weight:  124.7 kg (275 lb)  Height:  5\' 10"  (1.778 m)      Constitutional: NAD, calm, obese Eyes: PERTLA, lids and conjunctivae normal ENMT: Mucous membranes are moist. Posterior pharynx  clear of any exudate or lesions.   Neck: normal, supple, no masses, no thyromegaly Respiratory: Rales in bases. Dyspnea with speech. No accessory muscle use.  Cardiovascular: S1 & S2 heard, regular rate and rhythm. 1+ pretibial edema bilaterally. Abdomen: No distension, no tenderness, no masses palpated. Bowel sounds normal.  Musculoskeletal: no clubbing / cyanosis. No joint deformity upper and lower extremities.    Skin: no significant rashes, lesions, ulcers. Warm, dry, well-perfused. Neurologic: CN 2-12 grossly intact. Sensation  intact. Strength 5/5 in all 4 limbs.  Psychiatric: Alert and oriented x 3. Calm.     Labs on Admission: I have personally reviewed following labs and imaging studies  CBC: Recent Labs  Lab 12/17/17 2050  WBC 8.6  NEUTROABS 6.6  HGB 7.7*  HCT 25.3*  MCV 89.7  PLT 161   Basic Metabolic Panel: Recent Labs  Lab 12/17/17 2050  NA 143  K 4.4  CL 111  CO2 17*  GLUCOSE 105*  BUN 108*  CREATININE 7.07*  CALCIUM 6.2*   GFR: Estimated Creatinine Clearance: 15.1 mL/min (A) (by C-G formula based on SCr of 7.07 mg/dL (H)). Liver Function Tests: Recent Labs  Lab 12/17/17 2050  AST 12*  ALT 14*  ALKPHOS 76  BILITOT 0.4  PROT 6.8  ALBUMIN 2.8*   No results for input(s): LIPASE, AMYLASE in the last 168 hours. No results for input(s): AMMONIA in the last 168 hours. Coagulation Profile: No results for input(s): INR, PROTIME in the last 168 hours. Cardiac Enzymes: No results for input(s): CKTOTAL, CKMB, CKMBINDEX, TROPONINI in the last 168 hours. BNP (last 3 results) No results for input(s): PROBNP in the last 8760 hours. HbA1C: No results for input(s): HGBA1C in the last 72 hours. CBG: No results for input(s): GLUCAP in the last 168 hours. Lipid Profile: No results for input(s): CHOL, HDL, LDLCALC, TRIG, CHOLHDL, LDLDIRECT in the last 72 hours. Thyroid Function Tests: No results for input(s): TSH, T4TOTAL, FREET4, T3FREE, THYROIDAB in the last 72 hours. Anemia Panel: No results for input(s): VITAMINB12, FOLATE, FERRITIN, TIBC, IRON, RETICCTPCT in the last 72 hours. Urine analysis:    Component Value Date/Time   COLORURINE YELLOW 09/12/2014 1944   APPEARANCEUR CLEAR 09/12/2014 1944   LABSPEC >1.030 (H) 09/12/2014 1944   PHURINE 5.5 09/12/2014 1944   GLUCOSEU NEGATIVE 09/12/2014 1944   HGBUR MODERATE (A) 09/12/2014 1944   BILIRUBINUR NEGATIVE 09/12/2014 1944   KETONESUR NEGATIVE 09/12/2014 1944   PROTEINUR 100 (A) 09/12/2014 1944   UROBILINOGEN 0.2 09/12/2014 1944     NITRITE NEGATIVE 09/12/2014 1944   LEUKOCYTESUR NEGATIVE 09/12/2014 1944   Sepsis Labs: @LABRCNTIP (procalcitonin:4,lacticidven:4) )No results found for this or any previous visit (from the past 240 hour(s)).   Radiological Exams on Admission: Dg Chest 2 View  Result Date: 12/17/2017 CLINICAL DATA:  Shortness of breath with exertion EXAM: CHEST - 2 VIEW COMPARISON:  09/12/2014 FINDINGS: Cardiomegaly with vascular congestion. No pleural effusion or focal consolidation. Aortic atherosclerosis. No pneumothorax. IMPRESSION: Cardiomegaly with vascular congestion Electronically Signed   By: Donavan Foil M.D.   On: 12/17/2017 23:59    EKG: Independently reviewed. Sinus rhythm, non-specific T-wave abnormalities.   Assessment/Plan  1. Acute kidney injury superimposed on CKD stage IV; metabolic acidosis   - Presents at direction of his nephrologist after outpatient labs reveal SCr of 7.07, up from his reported baseline of mid-2 range  - There is metabolic acidosis and hypervolemia noted; potassium is wnl  - No recent vomiting or diarrhea, no new medications  -  Consult with nephrology, check renal US and urine chemistries, hold losartan, start bicarbonate, continue diuresis   2. Acute on chronic diastolic CHF  - Denies SOB, but tachypnea and dyspnea with speech noted  - Rales in bilateral bases, mild leg swelling, and neck vein distension noted on admission  - Treated in ED with Lasix 80 mg IV  - Continue cardiac monitoring, SLIV, follow daily wt and I/O's, continue diuresis with Lasix 60 mg IV q12h    3. Hypertension  - BP at goal  - Losartan held in light of AKI  - Use hydralazine IVP's prn for now   4. Type II DM  - A1c was 8.2% in 2015  - Currently diet-controlled  - Check CBG's and use a SSI as needed    5. Normocytic anemia  - Hgb is 7.7 on admission, down from 12.3 in 2017  - Denies melena or hematochezia, denies lightheadedness or new exercise-intolerance  - Likely secondary  to CKD  - Type and screen and check anemia panel    6. Hypocalcemia  - Calcium is 6.2 on admission with serum albumin 2.8, corrects to 7.2  - Treated with 1 g caclcium     DVT prophylaxis: sq heparin  Code Status: Full  Family Communication: Significant other updated at bedside  Consults called: Nephrology Admission status: Inpatient    Vianne Bulls, MD [facility] Pager [phone]  If 7PM-7AM, please contact night-coverage [email or web address] Password [email or web address]  12/18/2017, 1:08 AM

## 2017-12-18 NOTE — Progress Notes (Signed)
Patient seen and examined, database reviewed. D/w wife at bedside. Sent from Dr. Florentina Addison office due to a Cr of >7. Last he remembers, his Cr was 2. Cr is 6.97 today. He does not appear volume overloaded on exam. ECHO from 2015 with normal EF and grade 2 DD. Will hold lasix and start saline at 75 cc/hr. [place] without hydronephrosis. Nephrology consultation is pending. Will follow.  Domingo Mend, MD [facility] Pager: [phone]

## 2017-12-18 NOTE — Consult Note (Signed)
Saahas Hidrogo MRN: [id number] DOB/AGE: July 21, 1961 57 y.o. Primary Care Physician:Boone, Levada Dy, NP Admit date: 12/17/2017 Chief Complaint:  Chief Complaint  Patient presents with  . abnormal labs    HPI: Pt is 57 year old male with past medical hx of DM who presented to Er with c/o abnormal labs  HPI dates back yesterday go when pt was informed of critical labs and suggest to go to ER. Upon arrival to the ED, patient is found to be afebrile, saturating well on room air, and the chemistry panel showed bicarbonate of 17 and creatinine of 7.07.  Pt was  Also found to be  anemic with hemoglobin of 7.7, Pt was admitte for further tx. Pt seen today in ICUPtPt offers no c/o chest pain NO c/o fever/cough/chills  no c/o Hematuria  No c/o change in speech/vision Pt gives no c/o decreased appetite . Pt wife was present in the room , her main concern was "I fund him more sleepy than before" Pt wife also mentioned that " he has less energy than before"     Past Medical History:  Diagnosis Date  . Anasarca   . Anemia   . Cellulitis   . Diabetes mellitus without complication ([place])   . Hypertension   . Renal disorder        Family History  Problem Relation Age of Onset  . Cancer - Lung Father   . Congestive Heart Failure Mother   . Diabetes Mellitus II Mother   . Cervical cancer Mother   . Dementia Mother     Social History:  reports that he has never smoked. He has never used smokeless tobacco. He reports that he drank alcohol. He reports that he does not use drugs.   Allergies: No Known Allergies  Medications Prior to Admission  Medication Sig Dispense Refill  . losartan (COZAAR) 100 MG tablet Take 1 tablet (100 mg total) by mouth daily. 30 tablet 1  . Omega-3 Fatty Acids (FISH OIL PO) Take 1 tablet by mouth daily.    . Red Yeast Rice Extract (RED YEAST RICE PO) Take 1 tablet by mouth daily.    Marland Kitchen torsemide (DEMADEX) 20 MG tablet Take 2 tablets (40 mg total) by mouth daily. 60  tablet 0       SVX:BLTJQ from the symptoms mentioned above,there are no other symptoms referable to all systems reviewed.  . furosemide  60 mg Intravenous Q12H  . heparin  5,000 Units Subcutaneous Q8H  . insulin aspart  0-5 Units Subcutaneous QHS  . insulin aspart  0-9 Units Subcutaneous TID WC  . sodium bicarbonate  650 mg Oral BID  . sodium chloride flush  3 mL Intravenous Q12H      Physical Exam: Vital signs in last 24 hours: Temp:  [98.6 F (37 C)-98.7 F (37.1 C)] 98.7 F (37.1 C) (03/23 0625) Pulse Rate:  [83-92] 83 (03/23 0625) Resp:  [18-20] 20 (03/23 0800) BP: (138-162)/(70-84) 138/72 (03/23 0800) SpO2:  [93 %-96 %] 93 % (03/23 0625) Weight:  [275 lb (124.7 kg)] 275 lb (124.7 kg) (03/22 1959) Weight change:     Intake/Output from previous day: 03/22 0701 - 03/23 0700 In: 110 [IV Piggyback:110] Out: 500 [Urine:500] No intake/output data recorded.   Physical Exam: General- pt is awake,alert, oriented to time place and person Resp- No acute REsp distress, Decreased bs at bases. CVS- S1S2 regular in rate and rhythm GIT- BS+, soft, NT, obese EXT-trace LE Edema,No Cyanosis,Chronic venous stasis changes, + erythema CNS-  CN 2-12 grossly intact. Moving all 4 extremities Psych- normal mood and affect    Lab Results: CBC Recent Labs    12/17/17 2050  WBC 8.6  HGB 7.7*  HCT 25.3*  PLT 215    BMET Recent Labs    12/17/17 2050 12/18/17 0545  NA 143 142  K 4.4 3.9  CL 111 110  CO2 17* 17*  GLUCOSE 105* 112*  BUN 108* 85*  CREATININE 7.07* 6.97*  CALCIUM 6.2* 6.3*    Trend Creat 2019  7.0 2015  2.7--3.25 2014  1.13   MICRO No results found for this or any previous visit (from the past 240 hour(s)).    Lab Results  Component Value Date   PTH 141 (H) 01/29/2015   PTH Comment 01/29/2015   CALCIUM 6.3 (LL) 12/18/2017   PHOS 4.5 09/15/2014    Urine Spot Prote/crea ratio 229/92= 2.4 grams  Auto immune work up Hep A/B/C negative   ANA/Anti DS DNA/Anti GBM/ANCA-Negative Complements C3/C4/CH50- within Normal limits    Impression: 1)Renal  AKI vs CKD progression               CKD stage 4/5.               CKD since 2013?               CKD secondary to Multiple factors                      DM ( hx of uncontrolled DM)                       Sleep apnea                       Cardio renal                        Obesity related                 Progression of CKD rapid, marked with AKI                Proteinura Present  2)HTN Medication- On Diuretics   3)Anemia HGb at goal (9--11) Hx of Iron deficincy  4)CKD Mineral-Bone Disorder PTH high Secondary Hyperparathyroidism present. Phos at goal   5)Electrolytes Normokalemic NOrmonatremic   6)Acid base Co2 not at goal On PO Bicarb    Plan:  Agree with current tx and plan I had extensive discussion with pt and his wife about the stage of his kidney disease. I discussed with pt about possible need of renal replacement therapy soon,. I dicussed with pt about need/benefit/risks of renal replacement therapy. I dicussed with pt about different modalities of renal replacement therapies. I discussed with pt about risks/benefit/time regarding renal transplant. Will follow closely NO need of HD today    BHUTANI,MANPREET S 12/18/2017, 12:45 PM

## 2017-12-19 DIAGNOSIS — N178 Other acute kidney failure: Secondary | ICD-10-CM

## 2017-12-19 LAB — CBC
HCT: 26.9 % — ABNORMAL LOW (ref 39.0–52.0)
Hemoglobin: 8.2 g/dL — ABNORMAL LOW (ref 13.0–17.0)
MCH: 27.4 pg (ref 26.0–34.0)
MCHC: 30.5 g/dL (ref 30.0–36.0)
MCV: 90 fL (ref 78.0–100.0)
Platelets: 212 10*3/uL (ref 150–400)
RBC: 2.99 MIL/uL — AB (ref 4.22–5.81)
RDW: 13.7 % (ref 11.5–15.5)
WBC: 8.1 10*3/uL (ref 4.0–10.5)

## 2017-12-19 LAB — GLUCOSE, CAPILLARY
Glucose-Capillary: 93 mg/dL (ref 65–99)
Glucose-Capillary: 122 mg/dL — ABNORMAL HIGH (ref 65–99)
Glucose-Capillary: 95 mg/dL (ref 65–99)
Glucose-Capillary: 145 mg/dL — ABNORMAL HIGH (ref 65–99)

## 2017-12-19 LAB — BASIC METABOLIC PANEL
Anion gap: 14 (ref 5–15)
BUN: 106 mg/dL — AB (ref 6–20)
CHLORIDE: 110 mmol/L (ref 101–111)
CO2: 16 mmol/L — ABNORMAL LOW (ref 22–32)
Calcium: 6.3 mg/dL — CL (ref 8.9–10.3)
Creatinine, Ser: 7.26 mg/dL — ABNORMAL HIGH (ref 0.61–1.24)
GFR calc Af Amer: 9 mL/min — ABNORMAL LOW (ref >60)
GFR calc non Af Amer: 7 mL/min — ABNORMAL LOW (ref >60)
Glucose, Bld: 190 mg/dL — ABNORMAL HIGH (ref 65–99)
POTASSIUM: 4 mmol/L (ref 3.5–5.1)
Sodium: 140 mmol/L (ref 135–145)

## 2017-12-19 LAB — UREA NITROGEN, URINE: UREA NITROGEN UR: 387 mg/dL

## 2017-12-19 LAB — HIV ANTIBODY (ROUTINE TESTING W REFLEX): HIV SCREEN 4TH GENERATION: NONREACTIVE

## 2017-12-19 MED ORDER — LIVING BETTER WITH HEART FAILURE BOOK
Freq: Once | Status: AC
Start: 2017-12-19 — End: 2017-12-19
  Administered 2017-12-19: 10:00:00

## 2017-12-19 MED ORDER — SODIUM BICARBONATE 650 MG PO TABS
650.0000 mg | ORAL_TABLET | Freq: Three times a day (TID) | ORAL | Status: DC
  Administered 2017-12-19 – 2017-12-21 (×6): 650 mg via ORAL
  Filled 2017-12-19 (×6): qty 1

## 2017-12-19 MED ORDER — LIVING WELL WITH DIABETES BOOK
Freq: Once | Status: AC
Start: 2017-12-19 — End: 2017-12-19
  Administered 2017-12-19: 10:00:00

## 2017-12-19 NOTE — Progress Notes (Signed)
PROGRESS NOTE    Wesley [name]  BEM:[id number] DOB: 17-Mar-1961 DOA: 12/17/2017 PCP: Arsenio Katz, NP     Brief Narrative:  57 year old man admitted from home on 3/23, sent to the hospital by his nephrologist due to a creatinine level of greater than 7.  His baseline creatinine is around 2.  Admission was requested.   Assessment & Plan:   Principal Problem:   Acute renal failure superimposed on stage 4 chronic kidney disease (HCC) Active Problems:   HTN (hypertension)   Anemia of chronic disease   History of diabetes mellitus, type II   Heart failure (HCC)   Metabolic acidosis   Hypocalcemia   Acute on chronic kidney disease likely stage IV -Nephrology is on board. -Plan is to hydrate as he has good urine output and see if his kidneys recover, if not we will need to consider dialysis. -He does not have any emergent indication for dialysis at present. -Creatinine today is increased from yesterday at 7.26 with a BUN of 106 -He has had 2300 cc of urine output over the past 24 hours.  Chronic diastolic heart failure -Was given Lasix in the ED, this was discontinued on admission as he was thought to be somewhat dry. -2D echo from 2015 shows an ejection fraction of 55-60% with grade 2 diastolic dysfunction, will update echo.  Hypertension -Blood pressure is reasonably well-controlled, continue current medications.  Type 2 diabetes -Well-controlled, continue current regimen.  Normocytic anemia -Related to chronic kidney disease. -Hemoglobin stable to slightly increased at 8.2, no indication for transfusion at present. -To continue iron supplementation.   DVT prophylaxis: Subcutaneous heparin Code Status: Full code Family Communication: Wife at bedside Disposition Plan: Pending nephrology recommendations and improvement in renal function  Consultants:   Nephrology  Procedures:   Echo pending  Antimicrobials:  Anti-infectives (From admission, onward)   None       Subjective: Feels anxious and nervous  Objective: Vitals:   12/19/17 0300 12/19/17 0400 12/19/17 0500 12/19/17 0800  BP:      Pulse:      Resp: 15     Temp:  99.5 F (37.5 C)  98 F (36.7 C)  TempSrc:  Axillary  Oral  SpO2:      Weight:   123.2 kg (271 lb 9.7 oz)   Height:        Intake/Output Summary (Last 24 hours) at 12/19/2017 1747 Last data filed at 12/19/2017 1600 Gross per 24 hour  Intake 2649.25 ml  Output 2300 ml  Net 349.25 ml   Filed Weights   12/17/17 1959 12/19/17 0500  Weight: 124.7 kg (275 lb) 123.2 kg (271 lb 9.7 oz)    Examination:  General exam: Alert, awake, oriented x 3 Respiratory system: Clear to auscultation. Respiratory effort normal. Cardiovascular system:RRR. No murmurs, rubs, gallops. Gastrointestinal system: Abdomen is nondistended, soft and nontender. No organomegaly or masses felt. Normal bowel sounds heard. Central nervous system: Alert and oriented. No focal neurological deficits. Extremities: No C/C/E, +pedal pulses Skin: No rashes, lesions or ulcers Psychiatry: Judgement and insight appear normal. Mood & affect appropriate.     Data Reviewed: I have personally reviewed following labs and imaging studies  CBC: Recent Labs  Lab 12/17/17 2050 12/19/17 0946  WBC 8.6 8.1  NEUTROABS 6.6  --   HGB 7.7* 8.2*  HCT 25.3* 26.9*  MCV 89.7 90.0  PLT 215 071   Basic Metabolic Panel: Recent Labs  Lab 12/17/17 2050 12/18/17 0545 12/19/17 0946  NA 143 142  140  K 4.4 3.9 4.0  CL 111 110 110  CO2 17* 17* 16*  GLUCOSE 105* 112* 190*  BUN 108* 85* 106*  CREATININE 7.07* 6.97* 7.26*  CALCIUM 6.2* 6.3* 6.3*   GFR: Estimated Creatinine Clearance: 14.6 mL/min (A) (by C-G formula based on SCr of 7.26 mg/dL (H)). Liver Function Tests: Recent Labs  Lab 12/17/17 2050  AST 12*  ALT 14*  ALKPHOS 76  BILITOT 0.4  PROT 6.8  ALBUMIN 2.8*   No results for input(s): LIPASE, AMYLASE in the last 168 hours. No results for  input(s): AMMONIA in the last 168 hours. Coagulation Profile: No results for input(s): INR, PROTIME in the last 168 hours. Cardiac Enzymes: No results for input(s): CKTOTAL, CKMB, CKMBINDEX, TROPONINI in the last 168 hours. BNP (last 3 results) No results for input(s): PROBNP in the last 8760 hours. HbA1C: No results for input(s): HGBA1C in the last 72 hours. CBG: Recent Labs  Lab 12/18/17 1722 12/18/17 2147 12/19/17 0814 12/19/17 1232 12/19/17 1716  GLUCAP 112* 117* 93 122* 95   Lipid Profile: No results for input(s): CHOL, HDL, LDLCALC, TRIG, CHOLHDL, LDLDIRECT in the last 72 hours. Thyroid Function Tests: No results for input(s): TSH, T4TOTAL, FREET4, T3FREE, THYROIDAB in the last 72 hours. Anemia Panel: Recent Labs    12/18/17 0545  VITAMINB12 458  FOLATE 12.9  FERRITIN 95  TIBC 291  IRON 38*  RETICCTPCT 1.4   Urine analysis:    Component Value Date/Time   COLORURINE STRAW (A) 12/18/2017 0125   APPEARANCEUR CLEAR 12/18/2017 0125   LABSPEC 1.010 12/18/2017 0125   PHURINE 5.0 12/18/2017 0125   GLUCOSEU 50 (A) 12/18/2017 0125   HGBUR SMALL (A) 12/18/2017 0125   BILIRUBINUR NEGATIVE 12/18/2017 0125   KETONESUR NEGATIVE 12/18/2017 0125   PROTEINUR 100 (A) 12/18/2017 0125   UROBILINOGEN 0.2 09/12/2014 1944   NITRITE NEGATIVE 12/18/2017 0125   LEUKOCYTESUR NEGATIVE 12/18/2017 0125   Sepsis Labs: @LABRCNTIP (procalcitonin:4,lacticidven:4)  ) Recent Results (from the past 240 hour(s))  MRSA PCR Screening     Status: None   Collection Time: 12/18/17 11:01 AM  Result Value Ref Range Status   MRSA by PCR NEGATIVE NEGATIVE Final    Comment:        The GeneXpert MRSA Assay (FDA approved for NASAL specimens only), is one component of a comprehensive MRSA colonization surveillance program. It is not intended to diagnose MRSA infection nor to guide or monitor treatment for MRSA infections. Performed at [facility], [address], [place], [place] [address]           Radiology Studies: Dg Chest 2 View  Result Date: 12/17/2017 CLINICAL DATA:  Shortness of breath with exertion EXAM: CHEST - 2 VIEW COMPARISON:  09/12/2014 FINDINGS: Cardiomegaly with vascular congestion. No pleural effusion or focal consolidation. Aortic atherosclerosis. No pneumothorax. IMPRESSION: Cardiomegaly with vascular congestion Electronically Signed   By: Donavan Foil M.D.   On: 12/17/2017 23:59   US Renal  Result Date: 12/18/2017 CLINICAL DATA:  Acute renal failure superimposed on stage 4 chronic kidney disease. EXAM: RENAL / URINARY TRACT ULTRASOUND COMPLETE COMPARISON:  09/13/2014. FINDINGS: Right Kidney: Length: 13.6 cm. Normal echotexture. Diffuse cortical thinning. No hydronephrosis. Left Kidney: Length: 12.0 cm. Mildly increased echogenicity. Diffuse cortical thinning. No hydronephrosis. Bladder: Appears normal for degree of bladder distention. IMPRESSION: 1. No significant change in bilateral renal cortical atrophy. 2. Mildly increased echogenicity of the left kidney, compatible with mild medical renal disease. Electronically Signed   By: Remo Lipps  Joneen Caraway M.D.   On: 12/18/2017 09:22        Scheduled Meds: . heparin  5,000 Units Subcutaneous Q8H  . insulin aspart  0-5 Units Subcutaneous QHS  . insulin aspart  0-9 Units Subcutaneous TID WC  . sodium bicarbonate  650 mg Oral TID  . sodium chloride flush  3 mL Intravenous Q12H   Continuous Infusions: . sodium chloride    . sodium chloride 135 mL/hr at 12/19/17 1023     LOS: 1 day    Time spent: 25 minutes. Greater than 50% of this time was spent in direct contact with the patient coordinating care.     Lelon Frohlich, MD [facility] Pager [phone]  If 7PM-7AM, please contact night-coverage [email or web address] Password [facility] 12/19/2017, 5:47 PM

## 2017-12-19 NOTE — Progress Notes (Addendum)
Subjective: Interval History: has no complaint of nausea or vomiting.  Patient also denies any difficulty breathing..  Objective: Vital signs in last 24 hours: Temp:  [98 F (36.7 C)-99.5 F (37.5 C)] 98 F (36.7 C) (03/24 0800) Pulse Rate:  [84-94] 92 (03/23 1800) Resp:  [11-23] 15 (03/24 0300) BP: (147-182)/(71-90) 156/76 (03/23 2215) SpO2:  [93 %-97 %] 93 % (03/23 1800) Weight:  [123.2 kg (271 lb 9.7 oz)] 123.2 kg (271 lb 9.7 oz) (03/24 0500) Weight change: -1.539 kg (-3 lb 6.3 oz)  Intake/Output from previous day: 03/23 0701 - 03/24 0700 In: 1561.3 [P.O.:360; I.V.:1201.3] Out: 2725 [Urine:2725] Intake/Output this shift: No intake/output data recorded.  General appearance: alert, cooperative and no distress Resp: clear to auscultation bilaterally Cardio: regular rate and rhythm Extremities: No edema  Lab Results: Recent Labs    12/17/17 2050  WBC 8.6  HGB 7.7*  HCT 25.3*  PLT 215   BMET:  Recent Labs    12/17/17 2050 12/18/17 0545  NA 143 142  K 4.4 3.9  CL 111 110  CO2 17* 17*  GLUCOSE 105* 112*  BUN 108* 85*  CREATININE 7.07* 6.97*  CALCIUM 6.2* 6.3*   No results for input(s): PTH in the last 72 hours. Iron Studies:  Recent Labs    12/18/17 0545  IRON 38*  TIBC 291  FERRITIN 95    Studies/Results: Dg Chest 2 View  Result Date: 12/17/2017 CLINICAL DATA:  Shortness of breath with exertion EXAM: CHEST - 2 VIEW COMPARISON:  09/12/2014 FINDINGS: Cardiomegaly with vascular congestion. No pleural effusion or focal consolidation. Aortic atherosclerosis. No pneumothorax. IMPRESSION: Cardiomegaly with vascular congestion Electronically Signed   By: Donavan Foil M.D.   On: 12/17/2017 23:59   US Renal  Result Date: 12/18/2017 CLINICAL DATA:  Acute renal failure superimposed on stage 4 chronic kidney disease. EXAM: RENAL / URINARY TRACT ULTRASOUND COMPLETE COMPARISON:  09/13/2014. FINDINGS: Right Kidney: Length: 13.6 cm. Normal echotexture. Diffuse  cortical thinning. No hydronephrosis. Left Kidney: Length: 12.0 cm. Mildly increased echogenicity. Diffuse cortical thinning. No hydronephrosis. Bladder: Appears normal for degree of bladder distention. IMPRESSION: 1. No significant change in bilateral renal cortical atrophy. 2. Mildly increased echogenicity of the left kidney, compatible with mild medical renal disease. Electronically Signed   By: Claudie Revering M.D.   On: 12/18/2017 09:22    I have reviewed the patient's current medications.  Assessment/Plan: 1] acute kidney injury superimposed on chronic.  Most likely secondary to prerenal/ATN/ARB's patient presently nonoliguric.  Patient has 2300 cc of urine output.  His renal function  Hoe ever continued to decline. 2] chronic renal failure: Stage IV.  Etiology was thought to be secondary to diabetes/obesity related glomerulopathy/recurrent AK I/sleep apnea.  Presently patient does not have any uremic signs and symptoms. 3] anemia: His iron saturation is 13% and ferritin of 95.  Patient has possibly a component of iron deficiency anemia besides anemia of chronic disease.  His hemoglobin is 7.7.  Patient has been on oral iron supplement. 4] hypertension: His blood pressure is reasonably controlled 5] bone and mineral disorder: His calcium  is range but his phosphorus is not available.  Patient with history of secondary hyperparathyroidism was on calcitriol as an outpatient. 6] obesity Plan:1] we will check his renal panel in the morning 2] we will increase IV fluid to 135 cc/h 3] patient at this moment does not need dialysis.   LOS: 1 day   Rebecca Cairns S 12/19/2017,9:29 AM

## 2017-12-20 LAB — RENAL FUNCTION PANEL
ALBUMIN: 2.5 g/dL — AB (ref 3.5–5.0)
ANION GAP: 13 (ref 5–15)
BUN: 102 mg/dL — ABNORMAL HIGH (ref 6–20)
CALCIUM: 6 mg/dL — AB (ref 8.9–10.3)
CO2: 16 mmol/L — ABNORMAL LOW (ref 22–32)
CREATININE: 6.97 mg/dL — AB (ref 0.61–1.24)
Chloride: 114 mmol/L — ABNORMAL HIGH (ref 101–111)
GFR calc non Af Amer: 8 mL/min — ABNORMAL LOW (ref >60)
GFR, EST AFRICAN AMERICAN: 9 mL/min — AB (ref >60)
Glucose, Bld: 99 mg/dL (ref 65–99)
Phosphorus: 7.4 mg/dL — ABNORMAL HIGH (ref 2.5–4.6)
Potassium: 4.2 mmol/L (ref 3.5–5.1)
SODIUM: 143 mmol/L (ref 135–145)

## 2017-12-20 LAB — GLUCOSE, CAPILLARY
GLUCOSE-CAPILLARY: 95 mg/dL (ref 65–99)
GLUCOSE-CAPILLARY: 105 mg/dL — AB (ref 65–99)
Glucose-Capillary: 96 mg/dL (ref 65–99)
Glucose-Capillary: 119 mg/dL — ABNORMAL HIGH (ref 65–99)

## 2017-12-20 LAB — MAGNESIUM: Magnesium: 1.8 mg/dL (ref 1.7–2.4)

## 2017-12-20 MED ORDER — CALCIUM ACETATE (PHOS BINDER) 667 MG PO CAPS
667.0000 mg | ORAL_CAPSULE | ORAL | Status: DC
  Administered 2017-12-20 (×2): 667 mg via ORAL
  Filled 2017-12-20: qty 1

## 2017-12-20 MED ORDER — CALCIUM ACETATE (PHOS BINDER) 667 MG PO CAPS
1334.0000 mg | ORAL_CAPSULE | Freq: Three times a day (TID) | ORAL | Status: DC
  Administered 2017-12-20 – 2017-12-21 (×3): 1334 mg via ORAL
  Filled 2017-12-20 (×4): qty 2

## 2017-12-20 MED ORDER — SODIUM CHLORIDE 0.9 % IV SOLN
1.0000 g | Freq: Once | INTRAVENOUS | Status: DC
  Filled 2017-12-20: qty 10

## 2017-12-20 MED ORDER — FERUMOXYTOL INJECTION 510 MG/17 ML
510.0000 mg | INTRAVENOUS | Status: DC
  Administered 2017-12-20: 510 mg via INTRAVENOUS
  Filled 2017-12-20: qty 17

## 2017-12-20 NOTE — Progress Notes (Signed)
IV ferumoxytol given per orders with no signs or symptoms of adverse reactions.

## 2017-12-20 NOTE — Progress Notes (Signed)
CRITICAL VALUE ALERT  Critical Value: Calcium 6.0  Date & Time Notied: 3/25 0605 Provider Notified: Olevia Bowens  Orders Received/Actions taken:no new orders at this time

## 2017-12-20 NOTE — Progress Notes (Signed)
Subjective: Interval History: Says that there is still his feeling good.  He denies any nausea or vomiting.  Denies also any difficulty breathing.  Objective: Vital signs in last 24 hours: Temp:  [97.8 F (36.6 C)-98.9 F (37.2 C)] 98 F (36.7 C) (03/25 0733) BP: (148-158)/(64-68) 148/68 (03/25 0733) Weight:  [126.9 kg (279 lb 12.2 oz)] 126.9 kg (279 lb 12.2 oz) (03/25 0500) Weight change: 3.7 kg (8 lb 2.5 oz)  Intake/Output from previous day: 03/24 0701 - 03/25 0700 In: 2843 [I.V.:2843] Out: 1675 [Urine:1675] Intake/Output this shift: Total I/O In: -  Out: 300 [Urine:300]  Generally patient is alert and in no apparent distress Chest is clear to auscultation Heart exam revealed regular rate and rhythm no murmur no S3 Extremities he has trace edema bilaterally  Lab Results: Recent Labs    12/17/17 2050 12/19/17 0946  WBC 8.6 8.1  HGB 7.7* 8.2*  HCT 25.3* 26.9*  PLT 215 212   BMET:  Recent Labs    12/19/17 0946 12/20/17 0415  NA 140 143  K 4.0 4.2  CL 110 114*  CO2 16* 16*  GLUCOSE 190* 99  BUN 106* 102*  CREATININE 7.26* 6.97*  CALCIUM 6.3* 6.0*   No results for input(s): PTH in the last 72 hours. Iron Studies:  Recent Labs    12/18/17 0545  IRON 38*  TIBC 291  FERRITIN 95    Studies/Results: US Renal  Result Date: 12/18/2017 CLINICAL DATA:  Acute renal failure superimposed on stage 4 chronic kidney disease. EXAM: RENAL / URINARY TRACT ULTRASOUND COMPLETE COMPARISON:  09/13/2014. FINDINGS: Right Kidney: Length: 13.6 cm. Normal echotexture. Diffuse cortical thinning. No hydronephrosis. Left Kidney: Length: 12.0 cm. Mildly increased echogenicity. Diffuse cortical thinning. No hydronephrosis. Bladder: Appears normal for degree of bladder distention. IMPRESSION: 1. No significant change in bilateral renal cortical atrophy. 2. Mildly increased echogenicity of the left kidney, compatible with mild medical renal disease. Electronically Signed   By: Claudie Revering  M.D.   On: 12/18/2017 09:22    I have reviewed the patient's current medications.  Assessment/Plan: 1] acute kidney injury superimposed on chronic.  Most likely secondary to prerenal/ATN/ARB's patient presently nonoliguric.  He had 1600 cc of urine output.  His creatinine is 6.97 improving.  Patient at this moment does not have any uremic signs and symptoms. 2] chronic renal failure: Stage IV.  Etiology was thought to be secondary to diabetes/obesity related glomerulopathy/recurrent AK I/sleep apnea.  Presently patient does not have any uremic signs and symptoms. 3] anemia: His iron saturation is 13% and ferritin of 95.  Patient has possibly a component of iron deficiency anemia besides anemia of chronic disease.  His hemoglobin is stable. 4] hypertension: His blood pressure is reasonably controlled 5] bone and mineral disorder: His calcium  Is low but phosphorus is high.  Patient with history of secondary hyperparathyroidism was on calcitriol as an outpatient. 6] obesity Plan:1] we will give him Feraheme 510 mg IV today and another dose next week. 2] we will increase IV fluid to 135 cc/h 3] patient at this moment does not need dialysis. 4] we will start patient on PhosLo 667 mg 2 tablet p.o. 3 times daily with meals and one with snack.   LOS: 2 days   Laniesha Das S 12/20/2017,8:02 AM

## 2017-12-20 NOTE — Progress Notes (Signed)
Night shift telemetry coverage note.  Critical calcium level was reported to me by the nursing staff.  Calcium level was 6.0 mg/dL with an albumin of 2.5 g/dL.  Corrected to albumin and calcium level was 7.2 mg/dL.  No signs or symptoms of hypocalcemia.  IV calcium supplementation ordered.  Phosphorus level was 7.4, BUN was 102 and creatinine 6.97 mg/dL, which is consistent with hypocalcemia or renal disease.  Will however check  PTH, vitamin D metabolites and magnesium level.  They were added on to the morning labs.  Tennis Must, MD

## 2017-12-20 NOTE — Progress Notes (Signed)
PROGRESS NOTE    Wesley [name]  ZOX:[id number] DOB: 08-15-1961 DOA: 12/17/2017 PCP: Arsenio Katz, NP     Brief Narrative:  57 year old man admitted from home on 3/23, sent to the hospital by his nephrologist due to a creatinine level of greater than 7.  His baseline creatinine is around 2.  Admission was requested.   Assessment & Plan:   Principal Problem:   Acute renal failure superimposed on stage 4 chronic kidney disease (HCC) Active Problems:   HTN (hypertension)   Anemia of chronic disease   History of diabetes mellitus, type II   Heart failure (HCC)   Metabolic acidosis   Hypocalcemia   Acute on chronic kidney disease likely stage IV -Nephrology is on board. -Continues to have good urine output, patient remains asymptomatic and creatinine is slightly decreased today -He does not have any emergent indication for dialysis at present. -Creatinine today is 6.97 with a BUN of 102 -He has had 1675 cc of urine output over the past 24 hours. -Nephrology has increased IV fluids today.  Chronic diastolic heart failure -Was given Lasix in the ED, this was discontinued on admission as he was thought to be somewhat dry. -2D echo from 2015 shows an ejection fraction of 55-60% with grade 2 diastolic dysfunction, will update echo.  Hypertension -Blood pressure is reasonably well-controlled, continue current medications.  Type 2 diabetes -Well-controlled, continue current regimen.  Normocytic anemia -Related to chronic kidney disease. -Hemoglobin stable to slightly increased at 8.2, no indication for transfusion at present. -To continue iron supplementation. -IV iron ordered by nephrology today.   DVT prophylaxis: Subcutaneous heparin Code Status: Full code Family Communication: Wife at bedside Disposition Plan: Pending nephrology recommendations and improvement in renal function  Consultants:   Nephrology  Procedures:   Echo pending  Antimicrobials:    Anti-infectives (From admission, onward)   None       Subjective: Feels anxious and nervous, anxious for discharge.  Objective: Vitals:   12/20/17 0800 12/20/17 1046 12/20/17 1110 12/20/17 1124  BP: (!) 158/81 (!) 147/79 (!) 149/75   Pulse:      Resp:      Temp:    98.8 F (37.1 C)  TempSrc:    Oral  SpO2:      Weight:      Height:        Intake/Output Summary (Last 24 hours) at 12/20/2017 1643 Last data filed at 12/20/2017 1500 Gross per 24 hour  Intake 3827 ml  Output 2375 ml  Net 1452 ml   Filed Weights   12/17/17 1959 12/19/17 0500 12/20/17 0500  Weight: 124.7 kg (275 lb) 123.2 kg (271 lb 9.7 oz) 126.9 kg (279 lb 12.2 oz)    Examination:  General exam: Alert, awake, oriented x 3 Respiratory system: Clear to auscultation. Respiratory effort normal. Cardiovascular system:RRR. No murmurs, rubs, gallops. Gastrointestinal system: Abdomen is nondistended, soft and nontender. No organomegaly or masses felt. Normal bowel sounds heard. Central nervous system: Alert and oriented. No focal neurological deficits. Extremities: No C/C/E, +pedal pulses Skin: No rashes, lesions or ulcers Psychiatry: Judgement and insight appear normal. Mood & affect appropriate.      Data Reviewed: I have personally reviewed following labs and imaging studies  CBC: Recent Labs  Lab 12/17/17 2050 12/19/17 0946  WBC 8.6 8.1  NEUTROABS 6.6  --   HGB 7.7* 8.2*  HCT 25.3* 26.9*  MCV 89.7 90.0  PLT 215 811   Basic Metabolic Panel: Recent Labs  Lab 12/17/17 2050  12/18/17 0545 12/19/17 0946 12/20/17 0415 12/20/17 0741  NA 143 142 140 143  --   K 4.4 3.9 4.0 4.2  --   CL 111 110 110 114*  --   CO2 17* 17* 16* 16*  --   GLUCOSE 105* 112* 190* 99  --   BUN 108* 85* 106* 102*  --   CREATININE 7.07* 6.97* 7.26* 6.97*  --   CALCIUM 6.2* 6.3* 6.3* 6.0*  --   MG  --   --   --   --  1.8  PHOS  --   --   --  7.4*  --    GFR: Estimated Creatinine Clearance: 15.5 mL/min (A) (by C-G  formula based on SCr of 6.97 mg/dL (H)). Liver Function Tests: Recent Labs  Lab 12/17/17 2050 12/20/17 0415  AST 12*  --   ALT 14*  --   ALKPHOS 76  --   BILITOT 0.4  --   PROT 6.8  --   ALBUMIN 2.8* 2.5*   No results for input(s): LIPASE, AMYLASE in the last 168 hours. No results for input(s): AMMONIA in the last 168 hours. Coagulation Profile: No results for input(s): INR, PROTIME in the last 168 hours. Cardiac Enzymes: No results for input(s): CKTOTAL, CKMB, CKMBINDEX, TROPONINI in the last 168 hours. BNP (last 3 results) No results for input(s): PROBNP in the last 8760 hours. HbA1C: No results for input(s): HGBA1C in the last 72 hours. CBG: Recent Labs  Lab 12/19/17 1232 12/19/17 1716 12/19/17 2144 12/20/17 0737 12/20/17 1122  GLUCAP 122* 95 145* 95 105*   Lipid Profile: No results for input(s): CHOL, HDL, LDLCALC, TRIG, CHOLHDL, LDLDIRECT in the last 72 hours. Thyroid Function Tests: No results for input(s): TSH, T4TOTAL, FREET4, T3FREE, THYROIDAB in the last 72 hours. Anemia Panel: Recent Labs    12/18/17 0545  VITAMINB12 458  FOLATE 12.9  FERRITIN 95  TIBC 291  IRON 38*  RETICCTPCT 1.4   Urine analysis:    Component Value Date/Time   COLORURINE STRAW (A) 12/18/2017 0125   APPEARANCEUR CLEAR 12/18/2017 0125   LABSPEC 1.010 12/18/2017 0125   PHURINE 5.0 12/18/2017 0125   GLUCOSEU 50 (A) 12/18/2017 0125   HGBUR SMALL (A) 12/18/2017 0125   BILIRUBINUR NEGATIVE 12/18/2017 0125   KETONESUR NEGATIVE 12/18/2017 0125   PROTEINUR 100 (A) 12/18/2017 0125   UROBILINOGEN 0.2 09/12/2014 1944   NITRITE NEGATIVE 12/18/2017 0125   LEUKOCYTESUR NEGATIVE 12/18/2017 0125   Sepsis Labs: @LABRCNTIP (procalcitonin:4,lacticidven:4)  ) Recent Results (from the past 240 hour(s))  MRSA PCR Screening     Status: None   Collection Time: 12/18/17 11:01 AM  Result Value Ref Range Status   MRSA by PCR NEGATIVE NEGATIVE Final    Comment:        The GeneXpert MRSA Assay  (FDA approved for NASAL specimens only), is one component of a comprehensive MRSA colonization surveillance program. It is not intended to diagnose MRSA infection nor to guide or monitor treatment for MRSA infections. Performed at [facility], [address], [place], [place] [address]          Radiology Studies: No results found.      Scheduled Meds: . calcium acetate  1,334 mg Oral TID WC  . calcium acetate  667 mg Oral With snacks  . heparin  5,000 Units Subcutaneous Q8H  . insulin aspart  0-5 Units Subcutaneous QHS  . insulin aspart  0-9 Units Subcutaneous TID WC  . sodium bicarbonate  650  mg Oral TID  . sodium chloride flush  3 mL Intravenous Q12H   Continuous Infusions: . sodium chloride    . sodium chloride 135 mL/hr at 12/20/17 0824  . ferumoxytol Stopped (12/20/17 1105)     LOS: 2 days    Time spent: 25 minutes. Greater than 50% of this time was spent in direct contact with the patient coordinating care.     Lelon Frohlich, MD [facility] Pager [phone]  If 7PM-7AM, please contact night-coverage [email or web address] Password [facility] 12/20/2017, 4:43 PM

## 2017-12-21 LAB — RENAL FUNCTION PANEL
ALBUMIN: 2.6 g/dL — AB (ref 3.5–5.0)
Anion gap: 11 (ref 5–15)
BUN: 90 mg/dL — AB (ref 6–20)
CO2: 17 mmol/L — AB (ref 22–32)
Calcium: 6.6 mg/dL — ABNORMAL LOW (ref 8.9–10.3)
Chloride: 114 mmol/L — ABNORMAL HIGH (ref 101–111)
Creatinine, Ser: 6.79 mg/dL — ABNORMAL HIGH (ref 0.61–1.24)
GFR calc Af Amer: 9 mL/min — ABNORMAL LOW (ref >60)
GFR calc non Af Amer: 8 mL/min — ABNORMAL LOW (ref >60)
Glucose, Bld: 94 mg/dL (ref 65–99)
PHOSPHORUS: 7.1 mg/dL — AB (ref 2.5–4.6)
POTASSIUM: 4.3 mmol/L (ref 3.5–5.1)
SODIUM: 142 mmol/L (ref 135–145)

## 2017-12-21 LAB — CBC
HCT: 25.3 % — ABNORMAL LOW (ref 39.0–52.0)
Hemoglobin: 7.7 g/dL — ABNORMAL LOW (ref 13.0–17.0)
MCH: 27.6 pg (ref 26.0–34.0)
MCHC: 30.4 g/dL (ref 30.0–36.0)
MCV: 90.7 fL (ref 78.0–100.0)
Platelets: 183 10*3/uL (ref 150–400)
RBC: 2.79 MIL/uL — ABNORMAL LOW (ref 4.22–5.81)
RDW: 13.7 % (ref 11.5–15.5)
WBC: 6.9 10*3/uL (ref 4.0–10.5)

## 2017-12-21 LAB — GLUCOSE, CAPILLARY: GLUCOSE-CAPILLARY: 79 mg/dL (ref 65–99)

## 2017-12-21 LAB — VITAMIN D 25 HYDROXY (VIT D DEFICIENCY, FRACTURES): VIT D 25 HYDROXY: 11 ng/mL — AB (ref 30.0–100.0)

## 2017-12-21 LAB — PARATHYROID HORMONE, INTACT (NO CA): PTH: 219 pg/mL — AB (ref 15–65)

## 2017-12-21 MED ORDER — CALCIUM ACETATE (PHOS BINDER) 667 MG PO CAPS: 667.0000 mg | ORAL_CAPSULE | ORAL | 2 refills | Status: DC

## 2017-12-21 MED ORDER — CALCITRIOL 0.5 MCG PO CAPS
0.5000 ug | ORAL_CAPSULE | Freq: Every day | ORAL | Status: DC
  Administered 2017-12-21: 0.5 ug via ORAL
  Filled 2017-12-21: qty 1

## 2017-12-21 MED ORDER — SODIUM BICARBONATE 650 MG PO TABS: 650.0000 mg | ORAL_TABLET | Freq: Three times a day (TID) | ORAL | 2 refills | Status: DC

## 2017-12-21 MED ORDER — CALCITRIOL 0.5 MCG PO CAPS: 0.5000 ug | ORAL_CAPSULE | Freq: Every day | ORAL | 2 refills | Status: DC

## 2017-12-21 MED ORDER — CALCIUM ACETATE (PHOS BINDER) 667 MG PO CAPS: 1334.0000 mg | ORAL_CAPSULE | Freq: Three times a day (TID) | ORAL | 2 refills | Status: DC

## 2017-12-21 NOTE — Progress Notes (Signed)
Subjective: Interval History:  patient states that he is feeling good and he want to go home.  He does not have any difficulty breathing.  He does not have any nausea or vomiting.  Objective: Vital signs in last 24 hours: Temp:  [97.9 F (36.6 C)-99.5 F (37.5 C)] 97.9 F (36.6 C) (03/26 0410) Pulse Rate:  [83-86] 83 (03/26 0100) BP: (147-168)/(70-79) 159/70 (03/26 0410) SpO2:  [94 %-95 %] 95 % (03/26 0100) Weight:  [127.3 kg (280 lb 10.3 oz)] 127.3 kg (280 lb 10.3 oz) (03/26 0410) Weight change: 0.4 kg (14.1 oz)  Intake/Output from previous day: 03/25 0701 - 03/26 0700 In: 2987 [P.O.:840; I.V.:2030; IV Piggyback:117] Out: 2950 [Urine:2950] Intake/Output this shift: No intake/output data recorded.  Generally patient is alert and in no apparent distress Chest is clear to auscultation Heart exam revealed regular rate and rhythm no murmur no S3 Extremities he has trace edema bilaterally  Lab Results: Recent Labs    12/19/17 0946 12/21/17 0408  WBC 8.1 6.9  HGB 8.2* 7.7*  HCT 26.9* 25.3*  PLT 212 183   BMET:  Recent Labs    12/20/17 0415 12/21/17 0408  NA 143 142  K 4.2 4.3  CL 114* 114*  CO2 16* 17*  GLUCOSE 99 94  BUN 102* 90*  CREATININE 6.97* 6.79*  CALCIUM 6.0* 6.6*   Recent Labs    12/20/17 0415  PTH 219*   Iron Studies:  No results for input(s): IRON, TIBC, TRANSFERRIN, FERRITIN in the last 72 hours.  Studies/Results: No results found.  I have reviewed the patient's current medications.  Assessment/Plan: 1] acute kidney injury superimposed on chronic.  Most likely secondary to prerenal/ATN/ARB's patient presently nonoliguric.  He is BUN and creatinine is slowly improving.  Has 2900 cc of urine output.    Patient presently is a symptomatic. 2] chronic renal failure: Stage IV.  Etiology was thought to be secondary to diabetes/obesity related glomerulopathy/recurrent AK I/sleep apnea.  Presently patient does not have any uremic signs and symptoms. 3]  anemia: His iron saturation is 13% and ferritin of 95.  Patient has received IV iron yesterday. 4] hypertension: His blood pressure is reasonably controlled 5] bone and mineral disorder: Both his calcium and phosphorus is improving. 6] obesity Plan:1] we will restart him on Rocaltrol 0.5 mcg once a day 2] we will continue his present management.  If patient is going to be discharged we will check his blood work after 2 days and then make a decision about dialysis as an outpatient.  I have discussed with the patient and his wife what the general plan is. 3] renal panel in the morning.   LOS: 3 days   Opie Fanton S 12/21/2017,8:11 AM

## 2017-12-21 NOTE — Progress Notes (Signed)
Patient alert and oriented x4. No complaints of pain, shortness of breath, chest pain, dizziness, nausea or vomiting. Patient up out of bed ambulating in room and hallway independently with steady gait. Patient tolerated breakfast and medications well. Appetite good.  IV sites (2) removed with no complications. Pressure applied. Discharge instructions gone over with patient and patient's wife. Education on heart failure, acute renal disease, medications and scheduling follow up appointment with Dr. Lowanda Foster gone over with patient and his wife. Both expressed full understanding of instructions, education, follow up with providers and all questions were answered. Patient discharged home with all belongings with wife via car (wife driving).

## 2017-12-21 NOTE — Discharge Summary (Signed)
Physician Discharge Summary  Wesley [name] HWE:[id number] DOB: 1961/05/21 DOA: 12/17/2017  PCP: Arsenio Katz, NP  Admit date: 12/17/2017 Discharge date: 12/21/2017  Time spent: 45 minutes  Recommendations for Outpatient Follow-up:  -To be discharged home today. -Befekadu has arranged follow-up in 2 days to check renal function.  Discharge Diagnoses:  Principal Problem:   Acute renal failure superimposed on stage 4 chronic kidney disease (HCC) Active Problems:   HTN (hypertension)   Anemia of chronic disease   History of diabetes mellitus, type II   Heart failure (HCC)   Metabolic acidosis   Hypocalcemia   Discharge Condition: Stable  Filed Weights   12/19/17 0500 12/20/17 0500 12/21/17 0410  Weight: 123.2 kg (271 lb 9.7 oz) 126.9 kg (279 lb 12.2 oz) 127.3 kg (280 lb 10.3 oz)    History of present illness:  As per Dr. Myna Hidalgo on 3/23: Wesley [name] is a 57 y.o. male with medical history significant for chronic kidney disease stage IV, type 2 diabetes mellitus, and hypertension, now presenting to the emergency department for evaluation of abnormal blood work.  Patient has been following with nephrology for his chronic kidney disease and reports that his creatinine had been stable in the mid 2 range.  He had blood work drawn yesterday and was told to present to the emergency department for evaluation of renal failure.  He denies any change in urination, recent vomiting or diarrhea, or recent medication changes.  Denies melena or hematochezia.  ED Course: Upon arrival to the ED, patient is found to be afebrile, saturating well on room air, slightly hypertensive, and vitals otherwise stable.  EKG features a sinus rhythm with nonspecific T wave abnormality.  Chest x-ray is notable for cardiomegaly and vascular congestion.  Chemistry panel reveals a bicarbonate of 17, calcium 6.2, albumin 2.8, BUN 108, and creatinine of 7.07.  CBC features a normocytic anemia with hemoglobin of 7.7,  down from 12.3 in 2017.  Nephrology was consulted by the ED physician and recommended diuresis and medical admission to [facility] with local nephrologist to be consulted in the morning.  Patient remains hemodynamically stable, in no acute distress, and will be admitted to the telemetry unit for ongoing evaluation and management of acute renal failure superimposed on chronic kidney disease stage IV.    Hospital Course:   Acute on chronic kidney disease likely stage IV -Nephrology is on board. -Continues to have good urine output, patient remains asymptomatic and creatinine is slightly decreased today -He does not have any emergent indication for dialysis at present. -Creatinine today is 6.79 with a BUN of 90 -He has had 2950 cc of urine output over the past 24 hours. -Per nephrology okay for discharge home today with close follow-up in 2 days for monitoring of renal function.  Chronic diastolic heart failure -Was given Lasix in the ED, this was discontinued on admission as he was thought to be somewhat dry. -2D echo from 2015 shows an ejection fraction of 55-60% with grade 2 diastolic dysfunction, will update echo.  Hypertension -Blood pressure is reasonably well-controlled, continue current medications.  Type 2 diabetes -Well-controlled, continue current regimen.  Normocytic anemia -Related to chronic kidney disease. -Hemoglobin stable to slightly increased at 8.2, no indication for transfusion at present. -To continue iron supplementation. -IV iron ordered by nephrology on 3/25.     Procedures:  None   Consultations:  Nephrology  Discharge Instructions  Discharge Instructions    Diet - low sodium heart healthy  Complete by:  As directed    Increase activity slowly   Complete by:  As directed      Allergies as of 12/21/2017   No Known Allergies     Medication List    STOP taking these medications   losartan 100 MG tablet Commonly known as:   COZAAR   RED YEAST RICE PO   torsemide 20 MG tablet Commonly known as:  DEMADEX     TAKE these medications   calcitRIOL 0.5 MCG capsule Commonly known as:  ROCALTROL Take 1 capsule (0.5 mcg total) by mouth daily.   calcium acetate 667 MG capsule Commonly known as:  PHOSLO Take 2 capsules (1,334 mg total) by mouth 3 (three) times daily with meals.   calcium acetate 667 MG capsule Commonly known as:  PHOSLO Take 1 capsule (667 mg total) by mouth with snacks.   FISH OIL PO Take 1 tablet by mouth daily.   sodium bicarbonate 650 MG tablet Take 1 tablet (650 mg total) by mouth 3 (three) times daily.      No Known Allergies Follow-up Information    Fran Lowes, MD Follow up.   Specialty:  Nephrology Why:  Renal panel and CBC Contact information: [address] [address] [phone]            The results of significant diagnostics from this hospitalization (including imaging, microbiology, ancillary and laboratory) are listed below for reference.    Significant Diagnostic Studies: Dg Chest 2 View  Result Date: 12/17/2017 CLINICAL DATA:  Shortness of breath with exertion EXAM: CHEST - 2 VIEW COMPARISON:  09/12/2014 FINDINGS: Cardiomegaly with vascular congestion. No pleural effusion or focal consolidation. Aortic atherosclerosis. No pneumothorax. IMPRESSION: Cardiomegaly with vascular congestion Electronically Signed   By: Donavan Foil M.D.   On: 12/17/2017 23:59   US Renal  Result Date: 12/18/2017 CLINICAL DATA:  Acute renal failure superimposed on stage 4 chronic kidney disease. EXAM: RENAL / URINARY TRACT ULTRASOUND COMPLETE COMPARISON:  09/13/2014. FINDINGS: Right Kidney: Length: 13.6 cm. Normal echotexture. Diffuse cortical thinning. No hydronephrosis. Left Kidney: Length: 12.0 cm. Mildly increased echogenicity. Diffuse cortical thinning. No hydronephrosis. Bladder: Appears normal for degree of bladder distention. IMPRESSION: 1. No  significant change in bilateral renal cortical atrophy. 2. Mildly increased echogenicity of the left kidney, compatible with mild medical renal disease. Electronically Signed   By: Claudie Revering M.D.   On: 12/18/2017 09:22    Microbiology: Recent Results (from the past 240 hour(s))  MRSA PCR Screening     Status: None   Collection Time: 12/18/17 11:01 AM  Result Value Ref Range Status   MRSA by PCR NEGATIVE NEGATIVE Final    Comment:        The GeneXpert MRSA Assay (FDA approved for NASAL specimens only), is one component of a comprehensive MRSA colonization surveillance program. It is not intended to diagnose MRSA infection nor to guide or monitor treatment for MRSA infections. Performed at [facility], [address] [place], [place] [address]      Labs: Basic Metabolic Panel: Recent Labs  Lab 12/17/17 2050 12/18/17 0545 12/19/17 0946 12/20/17 0415 12/20/17 0741 12/21/17 0408  NA 143 142 140 143  --  142  K 4.4 3.9 4.0 4.2  --  4.3  CL 111 110 110 114*  --  114*  CO2 17* 17* 16* 16*  --  17*  GLUCOSE 105* 112* 190* 99  --  94  BUN 108* 85* 106* 102*  --  90*  CREATININE 7.07* 6.97* 7.26* 6.97*  --  6.79*  CALCIUM 6.2* 6.3* 6.3* 6.0*  --  6.6*  MG  --   --   --   --  1.8  --   PHOS  --   --   --  7.4*  --  7.1*   Liver Function Tests: Recent Labs  Lab 12/17/17 2050 12/20/17 0415 12/21/17 0408  AST 12*  --   --   ALT 14*  --   --   ALKPHOS 76  --   --   BILITOT 0.4  --   --   PROT 6.8  --   --   ALBUMIN 2.8* 2.5* 2.6*   No results for input(s): LIPASE, AMYLASE in the last 168 hours. No results for input(s): AMMONIA in the last 168 hours. CBC: Recent Labs  Lab 12/17/17 2050 12/19/17 0946 12/21/17 0408  WBC 8.6 8.1 6.9  NEUTROABS 6.6  --   --   HGB 7.7* 8.2* 7.7*  HCT 25.3* 26.9* 25.3*  MCV 89.7 90.0 90.7  PLT 215 212 183   Cardiac Enzymes: No results for input(s): CKTOTAL, CKMB, CKMBINDEX, TROPONINI in the last 168 hours. BNP: BNP (last 3  results) No results for input(s): BNP in the last 8760 hours.  ProBNP (last 3 results) No results for input(s): PROBNP in the last 8760 hours.  CBG: Recent Labs  Lab 12/20/17 0737 12/20/17 1122 12/20/17 1646 12/20/17 2132 12/21/17 0805  GLUCAP 95 105* 96 119* 79       Signed:  [facility] Pager: [phone] 12/21/2017, 1:45 PM

## 2018-01-12 ENCOUNTER — Encounter (HOSPITAL_COMMUNITY)
Admission: RE | Admit: 2018-01-12 | Discharge: 2018-01-12 | Disposition: A | Discharge status: Home / Self Care | Payer: Commercial Managed Care - PPO | Source: Ambulatory Visit | Attending: Nephrology | Admitting: Nephrology

## 2018-01-12 ENCOUNTER — Encounter (HOSPITAL_COMMUNITY): Payer: Self-pay

## 2018-01-12 DIAGNOSIS — N185 Chronic kidney disease, stage 5: Secondary | ICD-10-CM | POA: Insufficient documentation

## 2018-01-12 DIAGNOSIS — D631 Anemia in chronic kidney disease: Secondary | ICD-10-CM | POA: Diagnosis not present

## 2018-01-12 LAB — POCT HEMOGLOBIN-HEMACUE: HEMOGLOBIN: 7.5 g/dL — AB (ref 13.0–17.0)

## 2018-01-12 MED ORDER — DARBEPOETIN ALFA 60 MCG/0.3ML IJ SOSY
PREFILLED_SYRINGE | INTRAMUSCULAR | Status: AC
  Filled 2018-01-12: qty 0.3

## 2018-01-12 MED ORDER — DARBEPOETIN ALFA 60 MCG/0.3ML IJ SOSY
60.0000 ug | PREFILLED_SYRINGE | Freq: Once | INTRAMUSCULAR | Status: AC
  Administered 2018-01-12: 60 ug via SUBCUTANEOUS

## 2018-01-19 ENCOUNTER — Encounter (HOSPITAL_COMMUNITY)
Admission: RE | Admit: 2018-01-19 | Discharge: 2018-01-19 | Disposition: A | Discharge status: Home / Self Care | Payer: Commercial Managed Care - PPO | Source: Ambulatory Visit | Attending: Nephrology | Admitting: Nephrology

## 2018-01-19 DIAGNOSIS — D631 Anemia in chronic kidney disease: Secondary | ICD-10-CM | POA: Diagnosis not present

## 2018-01-19 LAB — POCT HEMOGLOBIN-HEMACUE: HEMOGLOBIN: 8.6 g/dL — AB (ref 13.0–17.0)

## 2018-01-19 MED ORDER — DARBEPOETIN ALFA 60 MCG/0.3ML IJ SOSY
60.0000 ug | PREFILLED_SYRINGE | Freq: Once | INTRAMUSCULAR | Status: AC
  Administered 2018-01-19: 60 ug via SUBCUTANEOUS

## 2018-01-19 MED ORDER — DARBEPOETIN ALFA 60 MCG/0.3ML IJ SOSY
PREFILLED_SYRINGE | INTRAMUSCULAR | Status: AC
  Filled 2018-01-19: qty 0.3

## 2018-01-19 NOTE — Progress Notes (Signed)
Results for VAYDEN, WEINAND (MRN [id number]) as of 01/19/2018 16:34  Ref. Range 01/19/2018 08:31  Hemoglobin Latest Ref Range: 13.0 - 17.0 g/dL 8.6 (L)

## 2018-01-26 ENCOUNTER — Other Ambulatory Visit (HOSPITAL_COMMUNITY): Payer: Commercial Managed Care - PPO

## 2018-01-26 ENCOUNTER — Encounter (HOSPITAL_COMMUNITY): Payer: Self-pay

## 2018-01-26 ENCOUNTER — Encounter (HOSPITAL_COMMUNITY)
Admission: RE | Admit: 2018-01-26 | Discharge: 2018-01-26 | Disposition: A | Discharge status: Home / Self Care | Payer: Commercial Managed Care - PPO | Source: Ambulatory Visit | Attending: Nephrology | Admitting: Nephrology

## 2018-01-26 ENCOUNTER — Encounter (HOSPITAL_COMMUNITY): Payer: Commercial Managed Care - PPO

## 2018-01-26 ENCOUNTER — Encounter: Payer: Commercial Managed Care - PPO | Admitting: Vascular Surgery

## 2018-01-26 DIAGNOSIS — N185 Chronic kidney disease, stage 5: Secondary | ICD-10-CM | POA: Insufficient documentation

## 2018-01-26 DIAGNOSIS — D631 Anemia in chronic kidney disease: Secondary | ICD-10-CM | POA: Diagnosis present

## 2018-01-26 LAB — POCT HEMOGLOBIN-HEMACUE: Hemoglobin: 9.1 g/dL — ABNORMAL LOW (ref 13.0–17.0)

## 2018-01-26 MED ORDER — DARBEPOETIN ALFA 60 MCG/0.3ML IJ SOSY
PREFILLED_SYRINGE | INTRAMUSCULAR | Status: AC
  Filled 2018-01-26: qty 0.3

## 2018-01-26 MED ORDER — DARBEPOETIN ALFA 60 MCG/0.3ML IJ SOSY
60.0000 ug | PREFILLED_SYRINGE | Freq: Once | INTRAMUSCULAR | Status: AC
  Administered 2018-01-26: 60 ug via SUBCUTANEOUS

## 2018-02-02 ENCOUNTER — Encounter (HOSPITAL_COMMUNITY)
Admission: RE | Admit: 2018-02-02 | Discharge: 2018-02-02 | Disposition: A | Discharge status: Home / Self Care | Payer: Commercial Managed Care - PPO | Source: Ambulatory Visit | Attending: Nephrology | Admitting: Nephrology

## 2018-02-02 DIAGNOSIS — D631 Anemia in chronic kidney disease: Secondary | ICD-10-CM | POA: Diagnosis not present

## 2018-02-02 LAB — CBC
HEMATOCRIT: 32.9 % — AB (ref 39.0–52.0)
HEMOGLOBIN: 10 g/dL — AB (ref 13.0–17.0)
MCH: 27.2 pg (ref 26.0–34.0)
MCHC: 30.4 g/dL (ref 30.0–36.0)
MCV: 89.6 fL (ref 78.0–100.0)
Platelets: 246 10*3/uL (ref 150–400)
RBC: 3.67 MIL/uL — ABNORMAL LOW (ref 4.22–5.81)
RDW: 14.5 % (ref 11.5–15.5)
WBC: 8.9 10*3/uL (ref 4.0–10.5)

## 2018-02-02 LAB — RENAL FUNCTION PANEL
ALBUMIN: 3 g/dL — AB (ref 3.5–5.0)
Anion gap: 12 (ref 5–15)
BUN: 65 mg/dL — AB (ref 6–20)
CALCIUM: 9.4 mg/dL (ref 8.9–10.3)
CO2: 22 mmol/L (ref 22–32)
CREATININE: 6.12 mg/dL — AB (ref 0.61–1.24)
Chloride: 105 mmol/L (ref 101–111)
GFR calc Af Amer: 11 mL/min — ABNORMAL LOW (ref >60)
GFR, EST NON AFRICAN AMERICAN: 9 mL/min — AB (ref >60)
GLUCOSE: 130 mg/dL — AB (ref 65–99)
PHOSPHORUS: 6.2 mg/dL — AB (ref 2.5–4.6)
Potassium: 5 mmol/L (ref 3.5–5.1)
SODIUM: 139 mmol/L (ref 135–145)

## 2018-02-02 LAB — PROTEIN / CREATININE RATIO, URINE
Creatinine, Urine: 80 mg/dL
PROTEIN CREATININE RATIO: 7.7 mg/mg{creat} — AB (ref 0.00–0.15)
TOTAL PROTEIN, URINE: 616 mg/dL

## 2018-02-02 LAB — POCT HEMOGLOBIN-HEMACUE: HEMOGLOBIN: 10 g/dL — AB (ref 13.0–17.0)

## 2018-02-02 LAB — IRON AND TIBC
Iron: 41 ug/dL — ABNORMAL LOW (ref 45–182)
Saturation Ratios: 14 % — ABNORMAL LOW (ref 17.9–39.5)
TIBC: 287 ug/dL (ref 250–450)
UIBC: 246 ug/dL

## 2018-02-03 LAB — PTH, INTACT AND CALCIUM
CALCIUM TOTAL (PTH): 9.3 mg/dL (ref 8.7–10.2)
PTH: 50 pg/mL (ref 15–65)

## 2018-02-03 LAB — VITAMIN D 25 HYDROXY (VIT D DEFICIENCY, FRACTURES): Vit D, 25-Hydroxy: 19.1 ng/mL — ABNORMAL LOW (ref 30.0–100.0)

## 2018-02-08 NOTE — Progress Notes (Signed)
Results for BRYSEN, SHANKMAN (MRN [id number]) as of 02/08/2018 10:16  Ref. Range 02/02/2018 08:21 02/02/2018 08:24 02/02/2018 08:26  Sodium Latest Ref Range: 135 - 145 mmol/L 139    Potassium Latest Ref Range: 3.5 - 5.1 mmol/L 5.0    Chloride Latest Ref Range: 101 - 111 mmol/L 105    CO2 Latest Ref Range: 22 - 32 mmol/L 22    Glucose Latest Ref Range: 65 - 99 mg/dL 130 (H)    BUN Latest Ref Range: 6 - 20 mg/dL 65 (H)    Creatinine Latest Ref Range: 0.61 - 1.24 mg/dL 6.12 (H)    Calcium Latest Ref Range: 8.9 - 10.3 mg/dL 9.4    Anion gap Latest Ref Range: 5 - 15  12    Phosphorus Latest Ref Range: 2.5 - 4.6 mg/dL 6.2 (H)    Albumin Latest Ref Range: 3.5 - 5.0 g/dL 3.0 (L)    GFR, Est Non African American Latest Ref Range: >60 mL/min 9 (L)    GFR, Est African American Latest Ref Range: >60 mL/min 11 (L)    Iron Latest Ref Range: 45 - 182 ug/dL   41 (L)  UIBC Latest Units: ug/dL   246  TIBC Latest Ref Range: 250 - 450 ug/dL   287  Saturation Ratios Latest Ref Range: 17.9 - 39.5 %   14 (L)  Vitamin D, 25-Hydroxy Latest Ref Range: 30.0 - 100.0 ng/mL 19.1 (L)    WBC Latest Ref Range: 4.0 - 10.5 K/uL 8.9    RBC Latest Ref Range: 4.22 - 5.81 MIL/uL 3.67 (L)    Hemoglobin Latest Ref Range: 13.0 - 17.0 g/dL 10.0 (L) 10.0 (L)   HCT Latest Ref Range: 39.0 - 52.0 % 32.9 (L)    MCV Latest Ref Range: 78.0 - 100.0 fL 89.6    MCH Latest Ref Range: 26.0 - 34.0 pg 27.2    MCHC Latest Ref Range: 30.0 - 36.0 g/dL 30.4    RDW Latest Ref Range: 11.5 - 15.5 % 14.5    Platelets Latest Ref Range: 150 - 400 K/uL 246    PTH, Intact Latest Ref Range: 15 - 65 pg/mL 50    Calcium, Total (PTH) Latest Ref Range: 8.7 - 10.2 mg/dL 9.3    PTH Interp Unknown Comment    Total Protein, Urine Latest Units: mg/dL 616    Protein Creatinine Ratio Latest Ref Range: 0.00 - 0.15 mg/mgCre 7.70 (H)    Creatinine, Urine Latest Units: mg/dL 80.00

## 2018-02-09 ENCOUNTER — Encounter (HOSPITAL_COMMUNITY)
Admission: RE | Admit: 2018-02-09 | Discharge: 2018-02-09 | Disposition: A | Discharge status: Home / Self Care | Payer: Commercial Managed Care - PPO | Source: Ambulatory Visit | Attending: Nephrology | Admitting: Nephrology

## 2018-02-09 DIAGNOSIS — D631 Anemia in chronic kidney disease: Secondary | ICD-10-CM | POA: Diagnosis not present

## 2018-02-09 LAB — POCT HEMOGLOBIN-HEMACUE: HEMOGLOBIN: 10.6 g/dL — AB (ref 13.0–17.0)

## 2018-02-10 NOTE — Final Progress Note (Signed)
Results for Wesley [name], Wesley [name] (MRN [id number]) as of 02/10/2018 14:27  Ref. Range 02/09/2018 08:23  Hemoglobin Latest Ref Range: 13.0 - 17.0 g/dL 10.6 (L)

## 2018-02-16 ENCOUNTER — Encounter (HOSPITAL_COMMUNITY)
Admission: RE | Admit: 2018-02-16 | Discharge: 2018-02-16 | Disposition: A | Discharge status: Home / Self Care | Payer: Commercial Managed Care - PPO | Source: Ambulatory Visit | Attending: Nephrology | Admitting: Nephrology

## 2018-02-16 DIAGNOSIS — D631 Anemia in chronic kidney disease: Secondary | ICD-10-CM | POA: Diagnosis not present

## 2018-02-16 LAB — POCT HEMOGLOBIN-HEMACUE: Hemoglobin: 10.6 g/dL — ABNORMAL LOW (ref 13.0–17.0)

## 2018-02-16 NOTE — Progress Notes (Signed)
Results for NICHOLUS, CHANDRAN (MRN [id number]) as of 02/16/2018 14:47  Ref. Range 02/16/2018 08:18  Hemoglobin Latest Ref Range: 13.0 - 17.0 g/dL 10.6 (L)

## 2018-02-23 ENCOUNTER — Encounter (HOSPITAL_COMMUNITY)
Admission: RE | Admit: 2018-02-23 | Discharge: 2018-02-23 | Disposition: A | Discharge status: Home / Self Care | Payer: Commercial Managed Care - PPO | Source: Ambulatory Visit | Attending: Nephrology | Admitting: Nephrology

## 2018-02-23 DIAGNOSIS — D631 Anemia in chronic kidney disease: Secondary | ICD-10-CM | POA: Diagnosis not present

## 2018-02-23 LAB — HEMOGLOBIN AND HEMATOCRIT, BLOOD
HCT: 33.9 % — ABNORMAL LOW (ref 39.0–52.0)
Hemoglobin: 10.4 g/dL — ABNORMAL LOW (ref 13.0–17.0)

## 2018-02-23 LAB — RENAL FUNCTION PANEL
ALBUMIN: 3.1 g/dL — AB (ref 3.5–5.0)
ANION GAP: 11 (ref 5–15)
BUN: 68 mg/dL — AB (ref 6–20)
CALCIUM: 9.3 mg/dL (ref 8.9–10.3)
CO2: 23 mmol/L (ref 22–32)
CREATININE: 6.15 mg/dL — AB (ref 0.61–1.24)
Chloride: 104 mmol/L (ref 101–111)
GFR calc Af Amer: 10 mL/min — ABNORMAL LOW (ref >60)
GFR calc non Af Amer: 9 mL/min — ABNORMAL LOW (ref >60)
GLUCOSE: 134 mg/dL — AB (ref 65–99)
PHOSPHORUS: 6.7 mg/dL — AB (ref 2.5–4.6)
Potassium: 5.2 mmol/L — ABNORMAL HIGH (ref 3.5–5.1)
SODIUM: 138 mmol/L (ref 135–145)

## 2018-02-23 LAB — PROTEIN / CREATININE RATIO, URINE: CREATININE, URINE: 109.52 mg/dL

## 2018-02-23 LAB — FERRITIN: FERRITIN: 177 ng/mL (ref 24–336)

## 2018-02-23 LAB — IRON AND TIBC
IRON: 56 ug/dL (ref 45–182)
Saturation Ratios: 21 % (ref 17.9–39.5)
TIBC: 270 ug/dL (ref 250–450)
UIBC: 214 ug/dL

## 2018-02-23 LAB — POCT HEMOGLOBIN-HEMACUE: HEMOGLOBIN: 10.3 g/dL — AB (ref 13.0–17.0)

## 2018-02-24 LAB — PTH, INTACT AND CALCIUM
CALCIUM TOTAL (PTH): 9.1 mg/dL (ref 8.7–10.2)
PTH: 47 pg/mL (ref 15–65)

## 2018-02-24 LAB — VITAMIN D 25 HYDROXY (VIT D DEFICIENCY, FRACTURES): VIT D 25 HYDROXY: 19.7 ng/mL — AB (ref 30.0–100.0)

## 2018-02-25 NOTE — Progress Notes (Signed)
Results for Wesley [name], Wesley [name] (MRN [id number]) as of 02/25/2018 07:33  Ref. Range 02/23/2018 08:16  Sodium Latest Ref Range: 135 - 145 mmol/L 138  Potassium Latest Ref Range: 3.5 - 5.1 mmol/L 5.2 (H)  Chloride Latest Ref Range: 101 - 111 mmol/L 104  CO2 Latest Ref Range: 22 - 32 mmol/L 23  Glucose Latest Ref Range: 65 - 99 mg/dL 134 (H)  BUN Latest Ref Range: 6 - 20 mg/dL 68 (H)  Creatinine Latest Ref Range: 0.61 - 1.24 mg/dL 6.15 (H)  Calcium Latest Ref Range: 8.9 - 10.3 mg/dL 9.3  Anion gap Latest Ref Range: 5 - 15  11  Phosphorus Latest Ref Range: 2.5 - 4.6 mg/dL 6.7 (H)  Albumin Latest Ref Range: 3.5 - 5.0 g/dL 3.1 (L)  GFR, Est Non African American Latest Ref Range: >60 mL/min 9 (L)  GFR, Est African American Latest Ref Range: >60 mL/min 10 (L)  Iron Latest Ref Range: 45 - 182 ug/dL 56  UIBC Latest Units: ug/dL 214  TIBC Latest Ref Range: 250 - 450 ug/dL 270  Saturation Ratios Latest Ref Range: 17.9 - 39.5 % 21  Ferritin Latest Ref Range: 24 - 336 ng/mL 177  Vitamin D, 25-Hydroxy Latest Ref Range: 30.0 - 100.0 ng/mL 19.7 (L)  Hemoglobin Latest Ref Range: 13.0 - 17.0 g/dL 10.4 (L)  HCT Latest Ref Range: 39.0 - 52.0 % 33.9 (L)  PTH, Intact Latest Ref Range: 15 - 65 pg/mL 47  Calcium, Total (PTH) Latest Ref Range: 8.7 - 10.2 mg/dL 9.1  PTH Interp Unknown Comment  Total Protein, Urine Latest Units: mg/dL <6  Protein Creatinine Ratio Latest Ref Range: 0.00 - 0.15 mg/mgCre Pend  Creatinine, Urine Latest Units: mg/dL 109.52

## 2018-03-01 ENCOUNTER — Encounter (HOSPITAL_COMMUNITY)
Admission: RE | Admit: 2018-03-01 | Discharge: 2018-03-01 | Disposition: A | Discharge status: Home / Self Care | Payer: Commercial Managed Care - PPO | Source: Ambulatory Visit | Attending: Nephrology | Admitting: Nephrology

## 2018-03-01 DIAGNOSIS — N189 Chronic kidney disease, unspecified: Secondary | ICD-10-CM | POA: Insufficient documentation

## 2018-03-01 DIAGNOSIS — D649 Anemia, unspecified: Secondary | ICD-10-CM | POA: Diagnosis not present

## 2018-03-01 LAB — POCT HEMOGLOBIN-HEMACUE: Hemoglobin: 10.5 g/dL — ABNORMAL LOW (ref 13.0–17.0)

## 2018-03-01 NOTE — Progress Notes (Signed)
Results for ISAHIA, HOLLERBACH (MRN [id number]) as of 03/01/2018 08:18  Ref. Range 03/01/2018 08:10  Hemoglobin Latest Ref Range: 13.0 - 17.0 g/dL 10.5 (L)

## 2018-03-09 ENCOUNTER — Encounter (HOSPITAL_COMMUNITY)
Admission: RE | Admit: 2018-03-09 | Discharge: 2018-03-09 | Disposition: A | Discharge status: Home / Self Care | Payer: Commercial Managed Care - PPO | Source: Ambulatory Visit | Attending: Nephrology | Admitting: Nephrology

## 2018-03-09 DIAGNOSIS — N189 Chronic kidney disease, unspecified: Secondary | ICD-10-CM | POA: Diagnosis not present

## 2018-03-09 LAB — POCT HEMOGLOBIN-HEMACUE: Hemoglobin: 10.2 g/dL — ABNORMAL LOW (ref 13.0–17.0)

## 2018-03-09 NOTE — Progress Notes (Signed)
Results for Wesley [name], SPACE (MRN [id number]) as of 03/09/2018 08:24  Ref. Range 03/09/2018 08:19  Hemoglobin Latest Ref Range: 13.0 - 17.0 g/dL 10.2 (L)   Aranesp not idnicated per MD order. Next visit 03/16/18.

## 2018-03-16 ENCOUNTER — Encounter (HOSPITAL_COMMUNITY)
Admission: RE | Admit: 2018-03-16 | Discharge: 2018-03-16 | Disposition: A | Discharge status: Home / Self Care | Payer: Commercial Managed Care - PPO | Source: Ambulatory Visit | Attending: Nephrology | Admitting: Nephrology

## 2018-03-16 ENCOUNTER — Encounter (HOSPITAL_COMMUNITY): Payer: Self-pay

## 2018-03-16 DIAGNOSIS — N189 Chronic kidney disease, unspecified: Secondary | ICD-10-CM | POA: Diagnosis not present

## 2018-03-16 LAB — POCT HEMOGLOBIN-HEMACUE: Hemoglobin: 9.6 g/dL — ABNORMAL LOW (ref 13.0–17.0)

## 2018-03-16 MED ORDER — DARBEPOETIN ALFA 60 MCG/0.3ML IJ SOSY
PREFILLED_SYRINGE | INTRAMUSCULAR | Status: AC
  Filled 2018-03-16: qty 0.3

## 2018-03-16 MED ORDER — DARBEPOETIN ALFA 100 MCG/0.5ML IJ SOSY
60.0000 ug | PREFILLED_SYRINGE | Freq: Once | INTRAMUSCULAR | Status: AC
  Administered 2018-03-16: 60 ug via SUBCUTANEOUS

## 2018-03-23 ENCOUNTER — Encounter (HOSPITAL_COMMUNITY): Payer: Self-pay

## 2018-03-23 ENCOUNTER — Encounter (HOSPITAL_COMMUNITY)
Admission: RE | Admit: 2018-03-23 | Discharge: 2018-03-23 | Disposition: A | Discharge status: Home / Self Care | Payer: Commercial Managed Care - PPO | Source: Ambulatory Visit | Attending: Nephrology | Admitting: Nephrology

## 2018-03-23 DIAGNOSIS — N189 Chronic kidney disease, unspecified: Secondary | ICD-10-CM | POA: Diagnosis not present

## 2018-03-23 LAB — POCT HEMOGLOBIN-HEMACUE: Hemoglobin: 10.2 g/dL — ABNORMAL LOW (ref 13.0–17.0)

## 2018-03-23 MED ORDER — DARBEPOETIN ALFA 60 MCG/0.3ML IJ SOSY: 60.0000 ug | PREFILLED_SYRINGE | Freq: Once | INTRAMUSCULAR | Status: DC

## 2018-03-23 NOTE — Progress Notes (Signed)
Results for COEN, MIYASATO (MRN [id number]) as of 03/23/2018 12:36  Ref. Range 03/23/2018 08:15  Hemoglobin Latest Ref Range: 13.0 - 17.0 g/dL 10.2 (L)

## 2018-03-23 NOTE — Progress Notes (Signed)
Results for LEO, FRAY (MRN [id number]) as of 03/23/2018 10:03  Ref. Range 03/23/2018 08:15  Hemoglobin Latest Ref Range: 13.0 - 17.0 g/dL 10.2 (L)

## 2018-03-30 ENCOUNTER — Encounter (HOSPITAL_COMMUNITY)
Admission: RE | Admit: 2018-03-30 | Discharge: 2018-03-30 | Disposition: A | Discharge status: Home / Self Care | Payer: Commercial Managed Care - PPO | Source: Ambulatory Visit | Attending: Nephrology | Admitting: Nephrology

## 2018-03-30 DIAGNOSIS — D649 Anemia, unspecified: Secondary | ICD-10-CM | POA: Insufficient documentation

## 2018-03-30 DIAGNOSIS — N185 Chronic kidney disease, stage 5: Secondary | ICD-10-CM | POA: Insufficient documentation

## 2018-03-30 LAB — POCT HEMOGLOBIN-HEMACUE: HEMOGLOBIN: 9.9 g/dL — AB (ref 13.0–17.0)

## 2018-03-30 MED ORDER — DARBEPOETIN ALFA 60 MCG/0.3ML IJ SOSY
60.0000 ug | PREFILLED_SYRINGE | Freq: Once | INTRAMUSCULAR | Status: AC
  Administered 2018-03-30: 60 ug via SUBCUTANEOUS
  Filled 2018-03-30: qty 0.3

## 2018-03-30 NOTE — Progress Notes (Signed)
Results for Wesley [name], Wesley [name] (MRN [id number]) as of 03/30/2018 08:39  Ref. Range 03/30/2018 08:15  Hemoglobin Latest Ref Range: 13.0 - 17.0 g/dL 9.9 (L)

## 2018-04-06 ENCOUNTER — Encounter (HOSPITAL_COMMUNITY)
Admission: RE | Admit: 2018-04-06 | Discharge: 2018-04-06 | Disposition: A | Discharge status: Home / Self Care | Payer: Commercial Managed Care - PPO | Source: Ambulatory Visit | Attending: Nephrology | Admitting: Nephrology

## 2018-04-06 ENCOUNTER — Encounter (HOSPITAL_COMMUNITY): Payer: Self-pay

## 2018-04-06 DIAGNOSIS — N185 Chronic kidney disease, stage 5: Secondary | ICD-10-CM | POA: Diagnosis not present

## 2018-04-06 LAB — POCT HEMOGLOBIN-HEMACUE: HEMOGLOBIN: 10.2 g/dL — AB (ref 13.0–17.0)

## 2018-04-06 MED ORDER — DARBEPOETIN ALFA 60 MCG/0.3ML IJ SOSY: 60.0000 ug | PREFILLED_SYRINGE | Freq: Once | INTRAMUSCULAR | Status: DC

## 2018-04-06 NOTE — Progress Notes (Signed)
Results for AVENIR, LOZINSKI (MRN [id number]) as of 04/06/2018 11:16  Ref. Range 04/06/2018 08:24  Hemoglobin Latest Ref Range: 13.0 - 17.0 g/dL 10.2 (L)

## 2018-04-08 NOTE — Progress Notes (Signed)
04/08/2018 0756-left message on answering machine for pt to return call. Reference to new order received about change in frequency of labs and aranesp.

## 2018-04-13 ENCOUNTER — Other Ambulatory Visit (HOSPITAL_COMMUNITY): Payer: Commercial Managed Care - PPO

## 2018-04-13 ENCOUNTER — Ambulatory Visit (HOSPITAL_COMMUNITY): Payer: Commercial Managed Care - PPO

## 2018-04-19 ENCOUNTER — Encounter (HOSPITAL_COMMUNITY)
Admission: RE | Admit: 2018-04-19 | Discharge: 2018-04-19 | Disposition: A | Discharge status: Home / Self Care | Payer: Commercial Managed Care - PPO | Source: Ambulatory Visit | Attending: Nephrology | Admitting: Nephrology

## 2018-04-19 ENCOUNTER — Encounter (HOSPITAL_COMMUNITY): Payer: Self-pay

## 2018-04-19 ENCOUNTER — Other Ambulatory Visit (HOSPITAL_COMMUNITY): Payer: Commercial Managed Care - PPO

## 2018-04-19 DIAGNOSIS — N185 Chronic kidney disease, stage 5: Secondary | ICD-10-CM | POA: Diagnosis not present

## 2018-04-19 LAB — POCT HEMOGLOBIN-HEMACUE: HEMOGLOBIN: 8.8 g/dL — AB (ref 13.0–17.0)

## 2018-04-19 MED ORDER — DARBEPOETIN ALFA 60 MCG/0.3ML IJ SOSY
60.0000 ug | PREFILLED_SYRINGE | INTRAMUSCULAR | Status: DC
  Administered 2018-04-19: 60 ug via SUBCUTANEOUS
  Filled 2018-04-19: qty 0.3

## 2018-04-27 ENCOUNTER — Other Ambulatory Visit (HOSPITAL_COMMUNITY): Payer: Self-pay | Admitting: *Deleted

## 2018-04-27 DIAGNOSIS — N185 Chronic kidney disease, stage 5: Secondary | ICD-10-CM

## 2018-04-27 DIAGNOSIS — D638 Anemia in other chronic diseases classified elsewhere: Secondary | ICD-10-CM

## 2018-04-27 DIAGNOSIS — E559 Vitamin D deficiency, unspecified: Secondary | ICD-10-CM

## 2018-04-27 DIAGNOSIS — R809 Proteinuria, unspecified: Secondary | ICD-10-CM

## 2018-04-27 DIAGNOSIS — D649 Anemia, unspecified: Secondary | ICD-10-CM

## 2018-05-04 ENCOUNTER — Encounter (HOSPITAL_COMMUNITY)
Admission: RE | Admit: 2018-05-04 | Discharge: 2018-05-04 | Disposition: A | Discharge status: Home / Self Care | Payer: Commercial Managed Care - PPO | Source: Ambulatory Visit | Attending: Nephrology | Admitting: Nephrology

## 2018-05-04 ENCOUNTER — Encounter (HOSPITAL_COMMUNITY): Payer: Self-pay

## 2018-05-04 DIAGNOSIS — D649 Anemia, unspecified: Secondary | ICD-10-CM | POA: Insufficient documentation

## 2018-05-04 DIAGNOSIS — D638 Anemia in other chronic diseases classified elsewhere: Secondary | ICD-10-CM

## 2018-05-04 DIAGNOSIS — R809 Proteinuria, unspecified: Secondary | ICD-10-CM

## 2018-05-04 DIAGNOSIS — N185 Chronic kidney disease, stage 5: Secondary | ICD-10-CM | POA: Insufficient documentation

## 2018-05-04 DIAGNOSIS — E559 Vitamin D deficiency, unspecified: Secondary | ICD-10-CM

## 2018-05-04 LAB — RENAL FUNCTION PANEL
Albumin: 2.9 g/dL — ABNORMAL LOW (ref 3.5–5.0)
Anion gap: 11 (ref 5–15)
BUN: 83 mg/dL — AB (ref 6–20)
CHLORIDE: 104 mmol/L (ref 98–111)
CO2: 24 mmol/L (ref 22–32)
Calcium: 8.4 mg/dL — ABNORMAL LOW (ref 8.9–10.3)
Creatinine, Ser: 9.92 mg/dL — ABNORMAL HIGH (ref 0.61–1.24)
GFR calc Af Amer: 6 mL/min — ABNORMAL LOW (ref >60)
GFR, EST NON AFRICAN AMERICAN: 5 mL/min — AB (ref >60)
Glucose, Bld: 113 mg/dL — ABNORMAL HIGH (ref 70–99)
POTASSIUM: 5 mmol/L (ref 3.5–5.1)
Phosphorus: 9.2 mg/dL — ABNORMAL HIGH (ref 2.5–4.6)
Sodium: 139 mmol/L (ref 135–145)

## 2018-05-04 LAB — FERRITIN: Ferritin: 163 ng/mL (ref 24–336)

## 2018-05-04 LAB — PROTEIN / CREATININE RATIO, URINE
Creatinine, Urine: 79.58 mg/dL
Total Protein, Urine: 429 mg/dL

## 2018-05-04 LAB — IRON AND TIBC
Iron: 36 ug/dL — ABNORMAL LOW (ref 45–182)
SATURATION RATIOS: 13 % — AB (ref 17.9–39.5)
TIBC: 283 ug/dL (ref 250–450)
UIBC: 247 ug/dL

## 2018-05-04 LAB — POCT HEMOGLOBIN-HEMACUE: Hemoglobin: 7.6 g/dL — ABNORMAL LOW (ref 13.0–17.0)

## 2018-05-04 LAB — HEMOGLOBIN AND HEMATOCRIT, BLOOD
HEMATOCRIT: 25.3 % — AB (ref 39.0–52.0)
HEMOGLOBIN: 7.7 g/dL — AB (ref 13.0–17.0)

## 2018-05-04 MED ORDER — DARBEPOETIN ALFA 60 MCG/0.3ML IJ SOSY
60.0000 ug | PREFILLED_SYRINGE | Freq: Once | INTRAMUSCULAR | Status: AC
  Administered 2018-05-04: 60 ug via SUBCUTANEOUS

## 2018-05-04 MED ORDER — DARBEPOETIN ALFA 60 MCG/0.3ML IJ SOSY
PREFILLED_SYRINGE | INTRAMUSCULAR | Status: AC
  Filled 2018-05-04: qty 0.3

## 2018-05-04 NOTE — Progress Notes (Signed)
Renal function panel grossly abnormal and Hgb 7.7.  Patient had expressed not feeling as well as he has, specifically fatigue and intermittent SOB.  Waited to get renal panel back before notifying office.  Stacy reviewed labs and is calling patient to come in for a visit today.

## 2018-05-05 LAB — PTH, INTACT AND CALCIUM
Calcium, Total (PTH): 8.4 mg/dL — ABNORMAL LOW (ref 8.7–10.2)
PTH: 68 pg/mL — ABNORMAL HIGH (ref 15–65)

## 2018-05-05 LAB — VITAMIN D 25 HYDROXY (VIT D DEFICIENCY, FRACTURES): VIT D 25 HYDROXY: 18.4 ng/mL — AB (ref 30.0–100.0)

## 2018-05-11 ENCOUNTER — Encounter (HOSPITAL_COMMUNITY)
Admission: RE | Admit: 2018-05-11 | Discharge: 2018-05-11 | Disposition: A | Discharge status: Home / Self Care | Payer: Commercial Managed Care - PPO | Source: Ambulatory Visit | Attending: Nephrology | Admitting: Nephrology

## 2018-05-11 ENCOUNTER — Encounter (HOSPITAL_COMMUNITY): Payer: Self-pay

## 2018-05-11 DIAGNOSIS — Z6836 Body mass index (BMI) 36.0-36.9, adult: Secondary | ICD-10-CM

## 2018-05-11 DIAGNOSIS — G4730 Sleep apnea, unspecified: Secondary | ICD-10-CM | POA: Diagnosis present

## 2018-05-11 DIAGNOSIS — I5033 Acute on chronic diastolic (congestive) heart failure: Secondary | ICD-10-CM | POA: Diagnosis present

## 2018-05-11 DIAGNOSIS — I132 Hypertensive heart and chronic kidney disease with heart failure and with stage 5 chronic kidney disease, or end stage renal disease: Principal | ICD-10-CM | POA: Diagnosis present

## 2018-05-11 DIAGNOSIS — R0602 Shortness of breath: Secondary | ICD-10-CM | POA: Diagnosis not present

## 2018-05-11 DIAGNOSIS — I21A1 Myocardial infarction type 2: Secondary | ICD-10-CM | POA: Diagnosis present

## 2018-05-11 DIAGNOSIS — N186 End stage renal disease: Secondary | ICD-10-CM | POA: Diagnosis present

## 2018-05-11 DIAGNOSIS — Z833 Family history of diabetes mellitus: Secondary | ICD-10-CM

## 2018-05-11 DIAGNOSIS — E1122 Type 2 diabetes mellitus with diabetic chronic kidney disease: Secondary | ICD-10-CM | POA: Diagnosis present

## 2018-05-11 DIAGNOSIS — D631 Anemia in chronic kidney disease: Secondary | ICD-10-CM | POA: Diagnosis present

## 2018-05-11 DIAGNOSIS — Z8049 Family history of malignant neoplasm of other genital organs: Secondary | ICD-10-CM

## 2018-05-11 DIAGNOSIS — Z8249 Family history of ischemic heart disease and other diseases of the circulatory system: Secondary | ICD-10-CM

## 2018-05-11 LAB — HEMOGLOBIN AND HEMATOCRIT, BLOOD
HCT: 23.4 % — ABNORMAL LOW (ref 39.0–52.0)
HEMOGLOBIN: 7.1 g/dL — AB (ref 13.0–17.0)

## 2018-05-11 MED ORDER — DARBEPOETIN ALFA 60 MCG/0.3ML IJ SOSY
60.0000 ug | PREFILLED_SYRINGE | Freq: Once | INTRAMUSCULAR | Status: AC
  Administered 2018-05-11: 60 ug via SUBCUTANEOUS
  Filled 2018-05-11: qty 0.3

## 2018-05-11 NOTE — Progress Notes (Signed)
Results for DEMERIUS, PODOLAK (MRN [id number]) as of 05/11/2018 15:24  Ref. Range 05/11/2018 14:34  Hemoglobin Latest Ref Range: 13.0 - 17.0 g/dL 7.1 (L)  HCT Latest Ref Range: 39.0 - 52.0 % 23.4 (L)

## 2018-05-11 NOTE — Progress Notes (Signed)
Spoke with Wesley [name] ( Dr. Florentina Addison nurse about Wesley [name] a hemoglobin of 7.1. I faxed results to Wesley [name] at Dr. Florentina Addison office per her request.

## 2018-05-12 ENCOUNTER — Emergency Department (HOSPITAL_COMMUNITY): Payer: Commercial Managed Care - PPO

## 2018-05-12 ENCOUNTER — Other Ambulatory Visit: Payer: Self-pay

## 2018-05-12 ENCOUNTER — Encounter (HOSPITAL_COMMUNITY): Payer: Self-pay | Admitting: Emergency Medicine

## 2018-05-12 ENCOUNTER — Inpatient Hospital Stay (HOSPITAL_COMMUNITY)
Admission: EM | Admit: 2018-05-12 | Discharge: 2018-05-16 | DRG: 280 | Disposition: A | Discharge status: Home / Self Care | Payer: Commercial Managed Care - PPO | Attending: Internal Medicine | Admitting: Internal Medicine

## 2018-05-12 DIAGNOSIS — Z992 Dependence on renal dialysis: Secondary | ICD-10-CM | POA: Diagnosis not present

## 2018-05-12 DIAGNOSIS — Z6836 Body mass index (BMI) 36.0-36.9, adult: Secondary | ICD-10-CM | POA: Diagnosis not present

## 2018-05-12 DIAGNOSIS — D631 Anemia in chronic kidney disease: Secondary | ICD-10-CM | POA: Diagnosis present

## 2018-05-12 DIAGNOSIS — N185 Chronic kidney disease, stage 5: Secondary | ICD-10-CM

## 2018-05-12 DIAGNOSIS — I21A1 Myocardial infarction type 2: Secondary | ICD-10-CM | POA: Diagnosis present

## 2018-05-12 DIAGNOSIS — Z833 Family history of diabetes mellitus: Secondary | ICD-10-CM | POA: Diagnosis not present

## 2018-05-12 DIAGNOSIS — I10 Essential (primary) hypertension: Secondary | ICD-10-CM | POA: Diagnosis not present

## 2018-05-12 DIAGNOSIS — I5032 Chronic diastolic (congestive) heart failure: Secondary | ICD-10-CM | POA: Diagnosis not present

## 2018-05-12 DIAGNOSIS — I248 Other forms of acute ischemic heart disease: Secondary | ICD-10-CM

## 2018-05-12 DIAGNOSIS — R0602 Shortness of breath: Secondary | ICD-10-CM | POA: Diagnosis present

## 2018-05-12 DIAGNOSIS — I5033 Acute on chronic diastolic (congestive) heart failure: Secondary | ICD-10-CM | POA: Diagnosis present

## 2018-05-12 DIAGNOSIS — Z8249 Family history of ischemic heart disease and other diseases of the circulatory system: Secondary | ICD-10-CM | POA: Diagnosis not present

## 2018-05-12 DIAGNOSIS — I132 Hypertensive heart and chronic kidney disease with heart failure and with stage 5 chronic kidney disease, or end stage renal disease: Secondary | ICD-10-CM | POA: Diagnosis present

## 2018-05-12 DIAGNOSIS — N189 Chronic kidney disease, unspecified: Secondary | ICD-10-CM

## 2018-05-12 DIAGNOSIS — N186 End stage renal disease: Secondary | ICD-10-CM

## 2018-05-12 DIAGNOSIS — Z01818 Encounter for other preprocedural examination: Secondary | ICD-10-CM

## 2018-05-12 DIAGNOSIS — G4730 Sleep apnea, unspecified: Secondary | ICD-10-CM | POA: Diagnosis present

## 2018-05-12 DIAGNOSIS — N184 Chronic kidney disease, stage 4 (severe): Secondary | ICD-10-CM | POA: Diagnosis present

## 2018-05-12 DIAGNOSIS — D649 Anemia, unspecified: Secondary | ICD-10-CM | POA: Diagnosis not present

## 2018-05-12 DIAGNOSIS — Z8049 Family history of malignant neoplasm of other genital organs: Secondary | ICD-10-CM | POA: Diagnosis not present

## 2018-05-12 DIAGNOSIS — N179 Acute kidney failure, unspecified: Secondary | ICD-10-CM

## 2018-05-12 DIAGNOSIS — E119 Type 2 diabetes mellitus without complications: Secondary | ICD-10-CM

## 2018-05-12 DIAGNOSIS — I214 Non-ST elevation (NSTEMI) myocardial infarction: Secondary | ICD-10-CM | POA: Diagnosis present

## 2018-05-12 DIAGNOSIS — E1122 Type 2 diabetes mellitus with diabetic chronic kidney disease: Secondary | ICD-10-CM | POA: Diagnosis present

## 2018-05-12 LAB — CBC WITH DIFFERENTIAL/PLATELET
BASOS ABS: 0 10*3/uL (ref 0.0–0.1)
Basophils Relative: 0 %
EOS PCT: 0 %
Eosinophils Absolute: 0.1 10*3/uL (ref 0.0–0.7)
HCT: 22.2 % — ABNORMAL LOW (ref 39.0–52.0)
Hemoglobin: 6.9 g/dL — CL (ref 13.0–17.0)
LYMPHS PCT: 5 %
Lymphs Abs: 0.8 10*3/uL (ref 0.7–4.0)
MCH: 28.3 pg (ref 26.0–34.0)
MCHC: 31.1 g/dL (ref 30.0–36.0)
MCV: 91 fL (ref 78.0–100.0)
MONOS PCT: 4 %
Monocytes Absolute: 0.7 10*3/uL (ref 0.1–1.0)
Neutro Abs: 14 10*3/uL — ABNORMAL HIGH (ref 1.7–7.7)
Neutrophils Relative %: 91 %
PLATELETS: 184 10*3/uL (ref 150–400)
RBC: 2.44 MIL/uL — ABNORMAL LOW (ref 4.22–5.81)
RDW: 14.9 % (ref 11.5–15.5)
WBC: 15.6 10*3/uL — ABNORMAL HIGH (ref 4.0–10.5)

## 2018-05-12 LAB — COMPREHENSIVE METABOLIC PANEL
ALT: 37 U/L (ref 0–44)
AST: 47 U/L — AB (ref 15–41)
Albumin: 2.5 g/dL — ABNORMAL LOW (ref 3.5–5.0)
Alkaline Phosphatase: 79 U/L (ref 38–126)
Anion gap: 14 (ref 5–15)
BILIRUBIN TOTAL: 0.9 mg/dL (ref 0.3–1.2)
BUN: 93 mg/dL — ABNORMAL HIGH (ref 6–20)
CHLORIDE: 96 mmol/L — AB (ref 98–111)
CO2: 27 mmol/L (ref 22–32)
CREATININE: 11.71 mg/dL — AB (ref 0.61–1.24)
Calcium: 8.2 mg/dL — ABNORMAL LOW (ref 8.9–10.3)
GFR calc Af Amer: 5 mL/min — ABNORMAL LOW (ref >60)
GFR, EST NON AFRICAN AMERICAN: 4 mL/min — AB (ref >60)
Glucose, Bld: 173 mg/dL — ABNORMAL HIGH (ref 70–99)
Potassium: 4.4 mmol/L (ref 3.5–5.1)
Sodium: 137 mmol/L (ref 135–145)
Total Protein: 6.6 g/dL (ref 6.5–8.1)

## 2018-05-12 LAB — URINALYSIS, ROUTINE W REFLEX MICROSCOPIC
Bacteria, UA: NONE SEEN
Bilirubin Urine: NEGATIVE
GLUCOSE, UA: 150 mg/dL — AB
KETONES UR: NEGATIVE mg/dL
LEUKOCYTES UA: NEGATIVE
Nitrite: NEGATIVE
PH: 7 (ref 5.0–8.0)
Specific Gravity, Urine: 1.014 (ref 1.005–1.030)

## 2018-05-12 LAB — TROPONIN I
Troponin I: 8.12 ng/mL (ref <0.03)
Troponin I: 8 ng/mL (ref <0.03)

## 2018-05-12 LAB — POC OCCULT BLOOD, ED: Fecal Occult Bld: NEGATIVE

## 2018-05-12 LAB — GLUCOSE, CAPILLARY: Glucose-Capillary: 105 mg/dL — ABNORMAL HIGH (ref 70–99)

## 2018-05-12 LAB — PROTIME-INR
INR: 1.41
Prothrombin Time: 17.1 seconds — ABNORMAL HIGH (ref 11.4–15.2)

## 2018-05-12 LAB — POCT HEMOGLOBIN-HEMACUE: Hemoglobin: 6.7 g/dL — CL (ref 13.0–17.0)

## 2018-05-12 LAB — I-STAT TROPONIN, ED: Troponin i, poc: 6.17 ng/mL (ref 0.00–0.08)

## 2018-05-12 LAB — MRSA PCR SCREENING: MRSA BY PCR: NEGATIVE

## 2018-05-12 LAB — LIPASE, BLOOD: LIPASE: 24 U/L (ref 11–51)

## 2018-05-12 MED ORDER — CALCITRIOL 0.5 MCG PO CAPS
0.5000 ug | ORAL_CAPSULE | Freq: Every day | ORAL | Status: DC
  Administered 2018-05-12 – 2018-05-16 (×5): 0.5 ug via ORAL
  Filled 2018-05-12 (×3): qty 1
  Filled 2018-05-12 (×2): qty 2
  Filled 2018-05-12 (×2): qty 1
  Filled 2018-05-12: qty 2
  Filled 2018-05-12 (×2): qty 1
  Filled 2018-05-12: qty 2

## 2018-05-12 MED ORDER — SODIUM CHLORIDE 0.9 % IV SOLN: 250.0000 mL | INTRAVENOUS | Status: DC | PRN

## 2018-05-12 MED ORDER — CALCIUM ACETATE (PHOS BINDER) 667 MG PO CAPS
667.0000 mg | ORAL_CAPSULE | ORAL | Status: DC
  Administered 2018-05-12 – 2018-05-15 (×5): 667 mg via ORAL
  Filled 2018-05-12 (×3): qty 1

## 2018-05-12 MED ORDER — SODIUM CHLORIDE 0.9 % IV SOLN: 10.0000 mL/h | Freq: Once | INTRAVENOUS | Status: DC

## 2018-05-12 MED ORDER — TORSEMIDE 20 MG PO TABS
40.0000 mg | ORAL_TABLET | Freq: Every day | ORAL | Status: DC
  Administered 2018-05-13 – 2018-05-15 (×3): 40 mg via ORAL
  Filled 2018-05-12 (×3): qty 2

## 2018-05-12 MED ORDER — ACETAMINOPHEN 325 MG PO TABS: 650.0000 mg | ORAL_TABLET | Freq: Four times a day (QID) | ORAL | Status: DC | PRN

## 2018-05-12 MED ORDER — ASPIRIN EC 81 MG PO TBEC: 81.0000 mg | DELAYED_RELEASE_TABLET | Freq: Every day | ORAL | Status: DC

## 2018-05-12 MED ORDER — ACETAMINOPHEN 650 MG RE SUPP: 650.0000 mg | Freq: Four times a day (QID) | RECTAL | Status: DC | PRN

## 2018-05-12 MED ORDER — CALCIUM ACETATE (PHOS BINDER) 667 MG PO CAPS
1334.0000 mg | ORAL_CAPSULE | Freq: Three times a day (TID) | ORAL | Status: DC
  Administered 2018-05-12 – 2018-05-16 (×9): 1334 mg via ORAL
  Filled 2018-05-12 (×12): qty 2

## 2018-05-12 MED ORDER — SODIUM BICARBONATE 650 MG PO TABS
650.0000 mg | ORAL_TABLET | Freq: Three times a day (TID) | ORAL | Status: DC
  Administered 2018-05-12 – 2018-05-15 (×9): 650 mg via ORAL
  Filled 2018-05-12 (×11): qty 1

## 2018-05-12 MED ORDER — SODIUM CHLORIDE 0.9% FLUSH
3.0000 mL | Freq: Two times a day (BID) | INTRAVENOUS | Status: DC
  Administered 2018-05-12 – 2018-05-16 (×5): 3 mL via INTRAVENOUS

## 2018-05-12 MED ORDER — POLYSACCHARIDE IRON COMPLEX 150 MG PO CAPS
150.0000 mg | ORAL_CAPSULE | Freq: Every day | ORAL | Status: DC
  Administered 2018-05-13 – 2018-05-16 (×4): 150 mg via ORAL
  Filled 2018-05-12 (×4): qty 1

## 2018-05-12 MED ORDER — TUBERCULIN PPD 5 UNIT/0.1ML ID SOLN
5.0000 [IU] | Freq: Once | INTRADERMAL | Status: AC
  Administered 2018-05-12: 5 [IU] via INTRADERMAL
  Filled 2018-05-12: qty 0.1

## 2018-05-12 MED ORDER — SODIUM CHLORIDE 0.9% FLUSH: 3.0000 mL | INTRAVENOUS | Status: DC | PRN

## 2018-05-12 NOTE — ED Provider Notes (Signed)
Emergency Department Provider Note   I have reviewed the triage vital signs and the nursing notes.   HISTORY  Chief Complaint Abnormal Lab   HPI Wesley [name] is a 57 y.o. male with PMH of CKD, HTN, and chronic LE edema with venous stasis dermatitis presents to the emergency department for evaluation of fatigue, shortness of breath in the setting of low hemoglobin and worsening renal function.  He is followed by Dr. Lowanda Foster with Nephrology. He come to the day center every 2 weeks for Aranesp injections.  Patient has also been on iron supplementation.  He continues to urinate normally and is not appreciating any bright red blood or black in his bowel movements.  He called his nephrologist yesterday feeling more fatigued and short of breath.  He went in to get blood work and was found to have a hemoglobin of 7.1.  When the results were called in today he was referred to the emergency department.    Past Medical History:  Diagnosis Date  . Anasarca   . Anemia   . Cellulitis   . Diabetes mellitus without complication ([place])   . Hypertension   . Renal disorder     Patient Active Problem List   Diagnosis Date Noted  . CKD (chronic kidney disease), stage IV ([place]) 05/12/2018  . SOB (shortness of breath) 05/12/2018  . NSTEMI (non-ST elevated myocardial infarction) ([place]) 05/12/2018  . Symptomatic anemia 05/12/2018  . Acute renal failure superimposed on stage 4 chronic kidney disease ([place]) 12/18/2017  . Metabolic acidosis 09/32/6712  . Hypocalcemia 12/18/2017  . Nephrotic syndrome 09/16/2014  . Chronic diastolic CHF (congestive heart failure) ([place]) 09/16/2014  . Cellulitis, scrotum 09/16/2014  . History of diabetes mellitus, type II 09/12/2014  . Cellulitis 09/12/2014  . Anasarca 09/12/2014  . AKI (acute kidney injury) ([place]) 09/12/2014  . Heart failure ([place]) 09/12/2014  . Normocytic anemia symptomatic 09/12/2014  . Elevated brain natriuretic peptide (BNP) level   . Anemia of  chronic disease 04/01/2012  . Cellulitis and abscess of foot 03/30/2012  . Diabetes mellitus ([place]) 03/30/2012  . HTN (hypertension) 03/30/2012  . Obesity 03/30/2012  . Leukocytosis 03/30/2012    Past Surgical History:  Procedure Laterality Date  . FOOT SURGERY     right  . INCISION AND DRAINAGE OF WOUND  04/03/2012   Procedure: IRRIGATION AND DEBRIDEMENT WOUND;  Surgeon: Scherry Ran, MD;  Location: [facility];  Service: General;  Laterality: Right;    Allergies Patient has no known allergies.  Family History  Problem Relation Age of Onset  . Cancer - Lung Father   . Congestive Heart Failure Mother   . Diabetes Mellitus II Mother   . Cervical cancer Mother   . Dementia Mother     Social History Social History   Tobacco Use  . Smoking status: Never Smoker  . Smokeless tobacco: Never Used  Substance Use Topics  . Alcohol use: Not Currently    Alcohol/week: 0.0 standard drinks    Comment: rarely  . Drug use: No    Review of Systems  Constitutional: No fever/chills. Positive generalized weakness.  Eyes: No visual changes. ENT: No sore throat. Cardiovascular: Denies chest pain.  Respiratory: Positive shortness of breath. Gastrointestinal: No abdominal pain.  No nausea, no vomiting.  No diarrhea.  No constipation. Genitourinary: Negative for dysuria. Musculoskeletal: Negative for back pain. Skin: Negative for rash. Neurological: Negative for headaches, focal weakness or numbness.  10-point ROS otherwise negative.  ____________________________________________   PHYSICAL EXAM:  VITAL SIGNS: ED Triage Vitals  Enc Vitals Group     BP 05/12/18 1220 (!) 151/71     Pulse Rate 05/12/18 1220 98     Resp 05/12/18 1220 16     Temp 05/12/18 1220 98.2 F (36.8 C)     Temp Source 05/12/18 1220 Oral     SpO2 05/12/18 1220 93 %     Weight 05/12/18 1221 279 lb 15.8 oz (127 kg)     Height 05/12/18 1221 5' 10.5" (1.791 m)     Pain Score 05/12/18 1219 0    Constitutional: Alert and oriented. Well appearing and in no acute distress. Eyes: Conjunctivae are normal.  Head: Atraumatic. Nose: No congestion/rhinnorhea. Mouth/Throat: Mucous membranes are moist. Neck: No stridor.   Cardiovascular: Normal rate, regular rhythm. Good peripheral circulation. Grossly normal heart sounds.   Respiratory: Normal respiratory effort.  No retractions. Lungs CTAB. Gastrointestinal: Soft and nontender. No distention. Rectal exam with brown, hemoccult negative stool.  Musculoskeletal: No lower extremity tenderness with chronic 2+ pitting edema with surrounding venous stasis dermatitis. No cellulitis. Shallow anterior left lower leg ulceration. No gross deformities of extremities. Neurologic:  Normal speech and language. No gross focal neurologic deficits are appreciated.  Skin:  Skin is warm, dry and intact. No rash noted.  ____________________________________________   LABS (all labs ordered are listed, but only abnormal results are displayed)  Labs Reviewed  COMPREHENSIVE METABOLIC PANEL - Abnormal; Notable for the following components:      Result Value   Chloride 96 (*)    Glucose, Bld 173 (*)    BUN 93 (*)    Creatinine, Ser 11.71 (*)    Calcium 8.2 (*)    Albumin 2.5 (*)    AST 47 (*)    GFR calc non Af Amer 4 (*)    GFR calc Af Amer 5 (*)    All other components within normal limits  CBC WITH DIFFERENTIAL/PLATELET - Abnormal; Notable for the following components:   WBC 15.6 (*)    RBC 2.44 (*)    Hemoglobin 6.9 (*)    HCT 22.2 (*)    Neutro Abs 14.0 (*)    All other components within normal limits  PROTIME-INR - Abnormal; Notable for the following components:   Prothrombin Time 17.1 (*)    All other components within normal limits  URINALYSIS, ROUTINE W REFLEX MICROSCOPIC - Abnormal; Notable for the following components:   Glucose, UA 150 (*)    Hgb urine dipstick SMALL (*)    Protein, ur >=300 (*)    All other components within normal  limits  TROPONIN I - Abnormal; Notable for the following components:   Troponin I 8.12 (*)    All other components within normal limits  TROPONIN I - Abnormal; Notable for the following components:   Troponin I 8.00 (*)    All other components within normal limits  GLUCOSE, CAPILLARY - Abnormal; Notable for the following components:   Glucose-Capillary 105 (*)    All other components within normal limits  I-STAT TROPONIN, ED - Abnormal; Notable for the following components:   Troponin i, poc 6.17 (*)    All other components within normal limits  MRSA PCR SCREENING  LIPASE, BLOOD  TROPONIN I  HEPATITIS B SURFACE ANTIGEN  HEPATITIS C ANTIBODY  BASIC METABOLIC PANEL  CBC  RENAL FUNCTION PANEL  POC OCCULT BLOOD, ED  TYPE AND SCREEN  PREPARE RBC (CROSSMATCH)   ____________________________________________  EKG   EKG Interpretation  Date/Time:  Thursday May 12 2018 13:22:12 EDT Ventricular Rate:  94 PR Interval:    QRS Duration: 105 QT Interval:  361 QTC Calculation: 452 R Axis:   25 Text Interpretation:  Sinus rhythm Repol abnrm, severe global ischemia (LM/MVD) No STEMI.  Confirmed by Nanda Quinton [phone]) on 05/12/2018 1:33:31 PM       ____________________________________________  RADIOLOGY  Dg Chest 2 View  Result Date: 05/12/2018 CLINICAL DATA:  Shortness of breath EXAM: CHEST - 2 VIEW COMPARISON:  December 17, 2017 FINDINGS: There is no appreciable edema or consolidation. There is cardiomegaly with pulmonary venous hypertension. No adenopathy. There is aortic atherosclerosis. No bone lesions. IMPRESSION: Persistent pulmonary vascular congestion without frank edema or consolidation. There is aortic atherosclerosis. Aortic Atherosclerosis (ICD10-I70.0). Electronically Signed   By: Lowella Grip III M.D.   On: 05/12/2018 13:19    ____________________________________________   PROCEDURES  Procedure(s) performed:   .Critical Care Performed by: Margette Fast,  MD Authorized by: Margette Fast, MD   Critical care provider statement:    Critical care time (minutes):  35   Critical care time was exclusive of:  Separately billable procedures and treating other patients and teaching time   Critical care was necessary to treat or prevent imminent or life-threatening deterioration of the following conditions:  Circulatory failure and cardiac failure   Critical care was time spent personally by me on the following activities:  Blood draw for specimens, development of treatment plan with patient or surrogate, discussions with consultants, evaluation of patient's response to treatment, examination of patient, obtaining history from patient or surrogate, ordering and review of laboratory studies, ordering and performing treatments and interventions, ordering and review of radiographic studies, pulse oximetry, re-evaluation of patient's condition and review of old charts   I assumed direction of critical care for this patient from another provider in my specialty: no       ____________________________________________   INITIAL IMPRESSION / [place] / ED COURSE  Pertinent labs & imaging results that were available during my care of the patient were reviewed by me and considered in my medical decision making (see chart for details).  Patient presents to the emergency department with worsening anemia in the setting of renal failure.  He is followed by nephrology and was referred here after having a hemoglobin yesterday of 7.1 despite regular Aranesp injections. Brown stool with hemoccult negative. Plan for labs, CXR, EKG, and reassess.   03:03 PM She with significant and worsening anemia and worsening kidney disease.  His EKG is abnormal showing diffuse ischemia without STEMI.  PRBCs ordered and discussed the case with the patient's nephrologist, Dr. Lowanda Foster, who will once again offered the patient dialysis here.  He will consult during the  hospitalization.  The patient's troponin came back elevated at 6.17.  I reviewed the case with Dr. Bronson Ing with cardiology.  He looked at the EKG and reviewed the lab work.  Patient is not having any chest pain symptoms.  He would not cath at this time and suspect that the patient's troponin and EKG changes are secondary to severe anemia.  Recommends correcting that and if patient began to have chest pain or other ACS equivalents would consider intervention at that time.  Will discuss the case with the hospitalist regarding admission.   Discussed patient's case with Hospitalist, Dr. Shanon Brow to request admission. Patient and family (if present) updated with plan. Care transferred to Hospitalist service.  I reviewed all nursing notes, vitals, pertinent old  records, EKGs, labs, imaging (as available).  ____________________________________________  FINAL CLINICAL IMPRESSION(S) / ED DIAGNOSES  Final diagnoses:  Symptomatic anemia  Acute renal failure superimposed on stage 5 chronic kidney disease, not on chronic dialysis, unspecified acute renal failure type ([place])  Demand ischemia of myocardium ([facility])     MEDICATIONS GIVEN DURING THIS VISIT:  Medications  calcitRIOL (ROCALTROL) capsule 0.5 mcg (0.5 mcg Oral Given 05/12/18 1750)  calcium acetate (PHOSLO) capsule 1,334 mg (1,334 mg Oral Given 05/12/18 1750)  calcium acetate (PHOSLO) capsule 667 mg (667 mg Oral Given 05/12/18 1953)  sodium bicarbonate tablet 650 mg (650 mg Oral Given 05/12/18 2136)  torsemide (DEMADEX) tablet 40 mg (has no administration in time range)  iron polysaccharides (NIFEREX) capsule 150 mg (has no administration in time range)  sodium chloride flush (NS) 0.9 % injection 3 mL (3 mLs Intravenous Given 05/12/18 2136)  sodium chloride flush (NS) 0.9 % injection 3 mL (has no administration in time range)  0.9 %  sodium chloride infusion (has no administration in time range)  acetaminophen (TYLENOL) tablet 650 mg (has no  administration in time range)    Or  acetaminophen (TYLENOL) suppository 650 mg (has no administration in time range)  tuberculin injection 5 Units (5 Units Intradermal Given 05/12/18 1751)    Note:  This document was prepared using Dragon voice recognition software and may include unintentional dictation errors.  Nanda Quinton, MD Emergency Medicine    Kirsten Spearing, [facility], MD 05/12/18 2325

## 2018-05-12 NOTE — Progress Notes (Signed)
Patient administered tuberculin injection on the anterior upper L forearm. Wesley [name] was formed.

## 2018-05-12 NOTE — ED Triage Notes (Signed)
Labs checked yesterday, HGB 7.1, fatigue, elevated creatine, decrease appetite.

## 2018-05-12 NOTE — Consult Note (Signed)
Reason for Consult: Worsening of renal failure Referring Physician: Dr. Royce Macadamia Metzgar is an 57 y.o. male.  HPI: He is a patient who has history of diabetes, hypertension, chronic renal failure stage V presently sent to the hospital because of severe anemia and worsening of renal failure.  Patient has been followed as an outpatient by me.  His renal function has been declining for some time but patient refused to have an access placed for possible dialysis.  Presently he has some exertional dyspnea and his hemoglobin went down to 6.8.  Patient has been getting erythropoietin as an outpatient.  Presently he denies any nausea or vomiting.  His BUN and creatinine has increased significantly.  Past Medical History:  Diagnosis Date  . Anasarca   . Anemia   . Cellulitis   . Diabetes mellitus without complication ([place])   . Hypertension   . Renal disorder     Past Surgical History:  Procedure Laterality Date  . FOOT SURGERY     right  . INCISION AND DRAINAGE OF WOUND  04/03/2012   Procedure: IRRIGATION AND DEBRIDEMENT WOUND;  Surgeon: Scherry Ran, MD;  Location: [facility];  Service: General;  Laterality: Right;    Family History  Problem Relation Age of Onset  . Cancer - Lung Father   . Congestive Heart Failure Mother   . Diabetes Mellitus II Mother   . Cervical cancer Mother   . Dementia Mother     Social History:  reports that he has never smoked. He has never used smokeless tobacco. He reports that he drank alcohol. He reports that he does not use drugs.  Allergies: No Known Allergies  Medications: I have reviewed the patient'[name] current medications.  Results for orders placed or performed during the hospital encounter of 05/12/18 (from the past 48 hour([name]))  Urinalysis, Routine w reflex microscopic     Status: Abnormal   Collection Time: 05/12/18 12:42 PM  Result Value Ref Range   Color, Urine YELLOW YELLOW   APPearance CLEAR CLEAR   Specific Gravity, Urine 1.014 1.005  - 1.030   pH 7.0 5.0 - 8.0   Glucose, UA 150 (A) NEGATIVE mg/dL   Hgb urine dipstick SMALL (A) NEGATIVE   Bilirubin Urine NEGATIVE NEGATIVE   Ketones, ur NEGATIVE NEGATIVE mg/dL   Protein, ur >=300 (A) NEGATIVE mg/dL   Nitrite NEGATIVE NEGATIVE   Leukocytes, UA NEGATIVE NEGATIVE   RBC / HPF 0-5 0 - 5 RBC/hpf   WBC, UA 0-5 0 - 5 WBC/hpf   Bacteria, UA NONE SEEN NONE SEEN   Squamous Epithelial / LPF 0-5 0 - 5    Comment: Performed at [facility], [address], [place], [place] [address]  POC occult blood, ED Provider will collect     Status: None   Collection Time: 05/12/18 12:50 PM  Result Value Ref Range   Fecal Occult Bld NEGATIVE NEGATIVE  Comprehensive metabolic panel     Status: Abnormal   Collection Time: 05/12/18  1:06 PM  Result Value Ref Range   Sodium 137 135 - 145 mmol/L   Potassium 4.4 3.5 - 5.1 mmol/L   Chloride 96 (L) 98 - 111 mmol/L   CO2 27 22 - 32 mmol/L   Glucose, Bld 173 (H) 70 - 99 mg/dL   BUN 93 (H) 6 - 20 mg/dL   Creatinine, Ser 11.71 (H) 0.61 - 1.24 mg/dL   Calcium 8.2 (L) 8.9 - 10.3 mg/dL   Total Protein 6.6 6.5 -  8.1 g/dL   Albumin 2.5 (L) 3.5 - 5.0 g/dL   AST 47 (H) 15 - 41 U/L   ALT 37 0 - 44 U/L   Alkaline Phosphatase 79 38 - 126 U/L   Total Bilirubin 0.9 0.3 - 1.2 mg/dL   GFR calc non Af Amer 4 (L) >60 mL/min   GFR calc Af Amer 5 (L) >60 mL/min    Comment: (NOTE) The eGFR has been calculated using the CKD EPI equation. This calculation has not been validated in all clinical situations. eGFR'[name] persistently <60 mL/min signify possible Chronic Kidney Disease.    Anion gap 14 5 - 15    Comment: Performed at [facility], [address], [place], [place] [address]  Lipase, blood     Status: None   Collection Time: 05/12/18  1:06 PM  Result Value Ref Range   Lipase 24 11 - 51 U/L    Comment: Performed at [facility], [address], [place], [place] [address]  CBC with Differential     Status: Abnormal   Collection Time: 05/12/18  1:06 PM   Result Value Ref Range   WBC 15.6 (H) 4.0 - 10.5 K/uL   RBC 2.44 (L) 4.22 - 5.81 MIL/uL   Hemoglobin 6.9 (LL) 13.0 - 17.0 g/dL    Comment: REPEATED TO VERIFY CRITICAL RESULT CALLED TO, READ BACK BY AND VERIFIED WITH: M WHITE AT 1404 BY HFLYNT 05/12/18    HCT 22.2 (L) 39.0 - 52.0 %   MCV 91.0 78.0 - 100.0 fL   MCH 28.3 26.0 - 34.0 pg   MCHC 31.1 30.0 - 36.0 g/dL   RDW 14.9 11.5 - 15.5 %   Platelets 184 150 - 400 K/uL   Neutrophils Relative % 91 %   Neutro Abs 14.0 (H) 1.7 - 7.7 K/uL   Lymphocytes Relative 5 %   Lymphs Abs 0.8 0.7 - 4.0 K/uL   Monocytes Relative 4 %   Monocytes Absolute 0.7 0.1 - 1.0 K/uL   Eosinophils Relative 0 %   Eosinophils Absolute 0.1 0.0 - 0.7 K/uL   Basophils Relative 0 %   Basophils Absolute 0.0 0.0 - 0.1 K/uL    Comment: Performed at [facility], [address] [place], [place] [address]  Protime-INR     Status: Abnormal   Collection Time: 05/12/18  1:06 PM  Result Value Ref Range   Prothrombin Time 17.1 (H) 11.4 - 15.2 seconds   INR 1.41     Comment: Performed at [facility], [address] [place], [place] [address]  Type and screen [facility]     Status: None   Collection Time: 05/12/18  1:06 PM  Result Value Ref Range   ABO/RH(D) A POS    Antibody Screen POS    Sample Expiration      05/15/2018 Performed at [facility], [address], [place],  [address]   I-stat troponin, ED     Status: Abnormal   Collection Time: 05/12/18  2:32 PM  Result Value Ref Range   Troponin i, poc 6.17 (HH) 0.00 - 0.08 ng/mL   Comment NOTIFIED PHYSICIAN    Comment 3            Comment: Due to the release kinetics of cTnI, a negative result within the first hours of the onset of symptoms does not rule out myocardial infarction with certainty. If myocardial infarction is still suspected, repeat the test at appropriate intervals.     Dg Chest 2  View  Result Date: 05/12/2018 CLINICAL DATA:  Shortness of breath EXAM: CHEST - 2 VIEW  COMPARISON:  December 17, 2017 FINDINGS: There is no appreciable edema or consolidation. There is cardiomegaly with pulmonary venous hypertension. No adenopathy. There is aortic atherosclerosis. No bone lesions. IMPRESSION: Persistent pulmonary vascular congestion without frank edema or consolidation. There is aortic atherosclerosis. Aortic Atherosclerosis (ICD10-I70.0). Electronically Signed   By: Lowella Grip III M.D.   On: 05/12/2018 13:19    Review of Systems  Constitutional: Negative for chills and fever.  Respiratory: Positive for cough. Negative for sputum production and shortness of breath.   Cardiovascular: Positive for leg swelling. Negative for chest pain and orthopnea.       Exertional dyapnea   Blood pressure (!) 151/71, pulse 98, temperature 98.2 F (36.8 C), temperature source Oral, resp. rate 16, height 5' 10.5" (1.791 m), weight 127 kg, SpO2 93 %. Physical Exam  Constitutional: He is oriented to person, place, and time.  Eyes: No scleral icterus.  Neck: No JVD present.  Cardiovascular: Normal rate and regular rhythm.  Respiratory: No respiratory distress.  Decreased breath sounds bilaterally  GI: He exhibits distension. There is no tenderness.  Musculoskeletal: He exhibits edema.  Patient has a small wound on the anterior surface of his left leg which has some drainage.  He has bilateral chronic erythema of his legs.  Neurological: He is oriented to person, place, and time.    Assessment/Plan: 1] chronic renal failure: Stage V.  Etiology was thought to be secondary to diabetes/obesity related glomerulopathy/cardiorenal.  Except his appetite declining patient does not have any nausea or vomiting.  His BUN and creatinine has increased significantly. 2] anemia: Possibly a combination of iron deficiency and anemia of chronic disease.  Patient has been receiving Arenasp subcutaneously every week however his hemoglobin continued to decline.  He has some exertional dyspnea  otherwise feels okay. 3] history of diabetes 5] hypertension: His blood pressure is reasonably controlled 6] fluid management: Patient has some exertional dyspnea.  He has been on diuretics.  He has also leg swelling. 6] bone and mineral disorder: His calcium is range 7] sleep apnea 8] obesity Plan: I have discussed with the patient in detail about his renal failure/anemia/fluid overload.  And I offered him to initiate dialysis by putting the femoral catheter for possible urgent dialysis patient declined.  However he is agreeable to have a tunneled catheter placed possibly tomorrow and initiate hemodialysis. 2] we will check his renal panel, CBC is a morning 3] we will make arrangement for possible tunneled catheter placement tomorrow and initiate dialysis 4] we will check PPD, hepatitis B surface antigen, hepatitis C antibody.  Wesley [name] 05/12/2018, 3:55 PM

## 2018-05-12 NOTE — ED Notes (Signed)
Date and time results received: 05/12/18 1450 (use smartphrase ".now" to insert current time)  Test: trop Critical Value: 6.17  Name of Provider Notified: long  Orders Received? Or Actions Taken?: retest with the lab not an [place]

## 2018-05-12 NOTE — H&P (Signed)
History and Physical    Wesley [name] YWV:[id number] DOB: 1960/12/17 DOA: 05/12/2018  PCP: Arsenio Katz, NP  Patient coming from: Home  Chief Complaint: Shortness of breath  HPI: Wesley [name] is a 57 y.o. male with medical history significant of end-stage renal disease declining for several months has been refusing dialysis and putting it off, chronic anemia, diabetes comes in with progressive worsening shortness of breath.  Patient reports no fevers no cough no nausea vomiting or diarrhea.  He has no chest pain.  Patient has had progressive worsening of his anemia despite outpatient treatment by Dr. Lowanda Foster.  His hemoglobin is now less than 7.  Dr. Lowanda Foster called advising likely institution of dialysis soon and blood transfusion.  Patient is being referred for admission for the need for urgent dialysis also in the setting of a positive troponin of over 6.  Dr. Bronson Ing was also called who thought this was due to demand ischemia.  Review of Systems: As per HPI otherwise 10 point review of systems negative.   Past Medical History:  Diagnosis Date  . Anasarca   . Anemia   . Cellulitis   . Diabetes mellitus without complication ([place])   . Hypertension   . Renal disorder     Past Surgical History:  Procedure Laterality Date  . FOOT SURGERY     right  . INCISION AND DRAINAGE OF WOUND  04/03/2012   Procedure: IRRIGATION AND DEBRIDEMENT WOUND;  Surgeon: Scherry Ran, MD;  Location: [facility];  Service: General;  Laterality: Right;     reports that he has never smoked. He has never used smokeless tobacco. He reports that he drank alcohol. He reports that he does not use drugs.  No Known Allergies  Family History  Problem Relation Age of Onset  . Cancer - Lung Father   . Congestive Heart Failure Mother   . Diabetes Mellitus II Mother   . Cervical cancer Mother   . Dementia Mother     Prior to Admission medications   Medication Sig Start Date End Date Taking? Authorizing  Provider  calcitRIOL (ROCALTROL) 0.5 MCG capsule Take 1 capsule (0.5 mcg total) by mouth daily. 12/21/17  Yes Isaac Bliss, Rayford Halsted, MD  calcium acetate (PHOSLO) 667 MG capsule Take 2 capsules (1,334 mg total) by mouth 3 (three) times daily with meals. 12/21/17  Yes Isaac Bliss, Rayford Halsted, MD  calcium acetate (PHOSLO) 667 MG capsule Take 1 capsule (667 mg total) by mouth with snacks. 12/21/17  Yes Isaac Bliss, Rayford Halsted, MD  DUREZOL 0.05 % EMUL 1 DROP IN LEFT EYE TWICE A DAY START AFTER SURGERY AND CONTINUE UNTIL DOCTOR INSTRUCTS DIFFERENTLY 03/25/18  Yes [provider]  FERREX 150 150 MG capsule Take 150 mg by mouth daily. 04/27/18  Yes [provider]  Omega-3 Fatty Acids (FISH OIL PO) Take 1 tablet by mouth daily.   Yes [provider]  PROLENSA 0.07 % SOLN INSTILL 1 DROP INTO LEFT EYE DAILY, START 5 DAYS PRIOR TO SURGERY AND CONTINUE UNTIL GONE 03/15/18  Yes [provider]  sodium bicarbonate 650 MG tablet Take 1 tablet (650 mg total) by mouth 3 (three) times daily. 12/21/17  Yes Isaac Bliss, Rayford Halsted, MD  torsemide (DEMADEX) 20 MG tablet Take 40 mg by mouth daily. 02/26/18  Yes [provider]    Physical Exam: Vitals:   05/12/18 1220 05/12/18 1221  BP: (!) 151/71   Pulse: 98   Resp: 16   Temp: 98.2 F (  36.8 C)   TempSrc: Oral   SpO2: 93%   Weight:  127 kg  Height:  5' 10.5" (1.791 m)      Constitutional: NAD, calm, comfortable Vitals:   05/12/18 1220 05/12/18 1221  BP: (!) 151/71   Pulse: 98   Resp: 16   Temp: 98.2 F (36.8 C)   TempSrc: Oral   SpO2: 93%   Weight:  127 kg  Height:  5' 10.5" (1.791 m)   Eyes: PERRL, lids and conjunctivae normal ENMT: Mucous membranes are moist. Posterior pharynx clear of any exudate or lesions.Normal dentition.  Neck: normal, supple, no masses, no thyromegaly Respiratory: clear to auscultation bilaterally, no wheezing, no crackles. Normal respiratory effort. No accessory muscle  use.  Cardiovascular: Regular rate and rhythm, no murmurs / rubs / gallops.  1+ extremity edema. 2+ pedal pulses. No carotid bruits.  Abdomen: no tenderness, no masses palpated. No hepatosplenomegaly. Bowel sounds positive.  Musculoskeletal: no clubbing / cyanosis. No joint deformity upper and lower extremities. Good ROM, no contractures. Normal muscle tone.  Skin: no rashes, lesions, ulcers. No induration Neurologic: CN 2-12 grossly intact. Sensation intact, DTR normal. Strength 5/5 in all 4.  Psychiatric: Normal judgment and insight. Alert and oriented x 3. Normal mood.    Labs on Admission: I have personally reviewed following labs and imaging studies  CBC: Recent Labs  Lab 05/11/18 1429 05/11/18 1434 05/12/18 1306  WBC  --   --  15.6*  NEUTROABS  --   --  14.0*  HGB 6.7* 7.1* 6.9*  HCT  --  23.4* 22.2*  MCV  --   --  91.0  PLT  --   --  191   Basic Metabolic Panel: Recent Labs  Lab 05/12/18 1306  NA 137  K 4.4  CL 96*  CO2 27  GLUCOSE 173*  BUN 93*  CREATININE 11.71*  CALCIUM 8.2*   GFR: Estimated Creatinine Clearance: 9.3 mL/min (A) (by C-G formula based on SCr of 11.71 mg/dL (H)). Liver Function Tests: Recent Labs  Lab 05/12/18 1306  AST 47*  ALT 37  ALKPHOS 79  BILITOT 0.9  PROT 6.6  ALBUMIN 2.5*   Recent Labs  Lab 05/12/18 1306  LIPASE 24   No results for input(s): AMMONIA in the last 168 hours. Coagulation Profile: Recent Labs  Lab 05/12/18 1306  INR 1.41   Cardiac Enzymes: No results for input(s): CKTOTAL, CKMB, CKMBINDEX, TROPONINI in the last 168 hours. BNP (last 3 results) No results for input(s): PROBNP in the last 8760 hours. HbA1C: No results for input(s): HGBA1C in the last 72 hours. CBG: No results for input(s): GLUCAP in the last 168 hours. Lipid Profile: No results for input(s): CHOL, HDL, LDLCALC, TRIG, CHOLHDL, LDLDIRECT in the last 72 hours. Thyroid Function Tests: No results for input(s): TSH, T4TOTAL, FREET4, T3FREE,  THYROIDAB in the last 72 hours. Anemia Panel: No results for input(s): VITAMINB12, FOLATE, FERRITIN, TIBC, IRON, RETICCTPCT in the last 72 hours. Urine analysis:    Component Value Date/Time   COLORURINE YELLOW 05/12/2018 1242   APPEARANCEUR CLEAR 05/12/2018 1242   LABSPEC 1.014 05/12/2018 1242   PHURINE 7.0 05/12/2018 1242   GLUCOSEU 150 (A) 05/12/2018 1242   HGBUR SMALL (A) 05/12/2018 1242   BILIRUBINUR NEGATIVE 05/12/2018 1242   KETONESUR NEGATIVE 05/12/2018 1242   PROTEINUR >=300 (A) 05/12/2018 1242   UROBILINOGEN 0.2 09/12/2014 1944   NITRITE NEGATIVE 05/12/2018 1242   LEUKOCYTESUR NEGATIVE 05/12/2018 1242   Sepsis Labs: !!!!!!!!!!!!!!!!!!!!!!!!!!!!!!!!!!!!!!!!!!!! @LABRCNTIP (procalcitonin:4,lacticidven:4) )No  results found for this or any previous visit (from the past 240 hour(s)).   Radiological Exams on Admission: Dg Chest 2 View  Result Date: 05/12/2018 CLINICAL DATA:  Shortness of breath EXAM: CHEST - 2 VIEW COMPARISON:  December 17, 2017 FINDINGS: There is no appreciable edema or consolidation. There is cardiomegaly with pulmonary venous hypertension. No adenopathy. There is aortic atherosclerosis. No bone lesions. IMPRESSION: Persistent pulmonary vascular congestion without frank edema or consolidation. There is aortic atherosclerosis. Aortic Atherosclerosis (ICD10-I70.0). Electronically Signed   By: Lowella Grip III M.D.   On: 05/12/2018 13:19    EKG: Independently reviewed.  T wave inversions in inferior lateral leads Old chart reviewed Case discussed with Dr. Laverta Baltimore in the ED Case discussed with Dr. Lowanda Foster with the nephrology service  Assessment/Plan 57 year old male with worsening renal failure symptomatic anemia and NSTEMI Principal Problem:   Normocytic anemia symptomatic-transfuse 2 units of blood tonight.  No overt bleeding.  Hemoccult negative.  Active Problems:   CKD (chronic kidney disease), stage IV (HCC)-plan will be to insert temporary dialysis  access tomorrow and start dialysis tomorrow.  Patient currently not uremic or any need for emergent dialysis.  Patient agreeable at this time to start this.    NSTEMI (non-ST elevated myocardial infarction) (HCC)-troponin over 6.  Serial troponin overnight.  Cardiology consulted.  Believe this is due to demand ischemia.  Further work-up per cardiology service.  Not cath candidate at this time could reconsider after institution of dialysis.  Holding off on aspirin at this time due to the need for access placement in the morning.    Diabetes mellitus (HCC)-sliding scale insulin    HTN (hypertension)-resume home meds    Chronic diastolic CHF (congestive heart failure) (HCC)-continue diuretics.  Currently compensated    SOB (shortness of breath)-due to symptomatic anemia transfuse as above      DVT prophylaxis: SCDs Code Status: Full Family Communication: Wife Disposition Plan: Days Consults called: Cardiology and nephrology Admission status: Admission   Bev Drennen A MD [facility]  If 7PM-7AM, please contact night-coverage [email or web address] Password [email or web address]  05/12/2018, 3:21 PM

## 2018-05-12 NOTE — ED Notes (Signed)
CRITICAL VALUE ALERT  Critical Value:  Hemoglobin 6.9  Date & Time Notied:  05/12/18 1403  Provider Notified: Dr. Laverta Baltimore  Orders Received/Actions taken: EDP notified, no further orders given at this time

## 2018-05-12 NOTE — ED Notes (Signed)
Date and time results received: 05/12/18 1600 (use smartphrase ".now" to insert current time)  Test: trop Critical Value: 8.12  Name of Provider Notified: long  Orders Received? Or Actions Taken?: no new orders

## 2018-05-12 NOTE — ED Notes (Signed)
Lab call and stated that his blood would be a while he has an antibody that they have to send to [facility] to get cross match

## 2018-05-13 ENCOUNTER — Encounter (HOSPITAL_COMMUNITY): Payer: Self-pay | Admitting: Diagnostic Radiology

## 2018-05-13 ENCOUNTER — Ambulatory Visit (HOSPITAL_COMMUNITY)
Admit: 2018-05-13 | Discharge: 2018-05-13 | Disposition: A | Discharge status: Home / Self Care | Payer: Commercial Managed Care - PPO | Attending: Nephrology | Admitting: Nephrology

## 2018-05-13 DIAGNOSIS — N186 End stage renal disease: Secondary | ICD-10-CM

## 2018-05-13 DIAGNOSIS — R0602 Shortness of breath: Secondary | ICD-10-CM

## 2018-05-13 DIAGNOSIS — N184 Chronic kidney disease, stage 4 (severe): Secondary | ICD-10-CM

## 2018-05-13 DIAGNOSIS — I10 Essential (primary) hypertension: Secondary | ICD-10-CM

## 2018-05-13 HISTORY — PX: IR US GUIDE VASC ACCESS RIGHT: IMG2390

## 2018-05-13 HISTORY — PX: IR FLUORO GUIDE CV LINE RIGHT: IMG2283

## 2018-05-13 LAB — RENAL FUNCTION PANEL
Albumin: 2.6 g/dL — ABNORMAL LOW (ref 3.5–5.0)
Anion gap: 15 (ref 5–15)
BUN: 102 mg/dL — AB (ref 6–20)
CALCIUM: 8.5 mg/dL — AB (ref 8.9–10.3)
CO2: 26 mmol/L (ref 22–32)
Chloride: 96 mmol/L — ABNORMAL LOW (ref 98–111)
Creatinine, Ser: 11.73 mg/dL — ABNORMAL HIGH (ref 0.61–1.24)
GFR calc Af Amer: 5 mL/min — ABNORMAL LOW (ref >60)
GFR, EST NON AFRICAN AMERICAN: 4 mL/min — AB (ref >60)
Glucose, Bld: 109 mg/dL — ABNORMAL HIGH (ref 70–99)
PHOSPHORUS: 8.9 mg/dL — AB (ref 2.5–4.6)
Potassium: 4.5 mmol/L (ref 3.5–5.1)
SODIUM: 137 mmol/L (ref 135–145)

## 2018-05-13 LAB — HEPATITIS B SURFACE ANTIGEN: HEP B S AG: NEGATIVE

## 2018-05-13 LAB — CBC
HCT: 24.9 % — ABNORMAL LOW (ref 39.0–52.0)
HEMOGLOBIN: 7.8 g/dL — AB (ref 13.0–17.0)
MCH: 28.5 pg (ref 26.0–34.0)
MCHC: 31.3 g/dL (ref 30.0–36.0)
MCV: 90.9 fL (ref 78.0–100.0)
Platelets: 187 10*3/uL (ref 150–400)
RBC: 2.74 MIL/uL — ABNORMAL LOW (ref 4.22–5.81)
RDW: 15.2 % (ref 11.5–15.5)
WBC: 11.8 10*3/uL — ABNORMAL HIGH (ref 4.0–10.5)

## 2018-05-13 LAB — BASIC METABOLIC PANEL
ANION GAP: 16 — AB (ref 5–15)
BUN: 101 mg/dL — ABNORMAL HIGH (ref 6–20)
CALCIUM: 8.5 mg/dL — AB (ref 8.9–10.3)
CO2: 26 mmol/L (ref 22–32)
Chloride: 96 mmol/L — ABNORMAL LOW (ref 98–111)
Creatinine, Ser: 11.79 mg/dL — ABNORMAL HIGH (ref 0.61–1.24)
GFR, EST AFRICAN AMERICAN: 5 mL/min — AB (ref >60)
GFR, EST NON AFRICAN AMERICAN: 4 mL/min — AB (ref >60)
Glucose, Bld: 112 mg/dL — ABNORMAL HIGH (ref 70–99)
POTASSIUM: 4.5 mmol/L (ref 3.5–5.1)
Sodium: 138 mmol/L (ref 135–145)

## 2018-05-13 LAB — PREPARE RBC (CROSSMATCH)

## 2018-05-13 LAB — TROPONIN I: TROPONIN I: 6.6 ng/mL — AB (ref <0.03)

## 2018-05-13 MED ORDER — SODIUM CHLORIDE 0.9 % IV SOLN: 100.0000 mL | INTRAVENOUS | Status: DC | PRN

## 2018-05-13 MED ORDER — ALTEPLASE 2 MG IJ SOLR
2.0000 mg | Freq: Once | INTRAMUSCULAR | Status: DC | PRN
  Filled 2018-05-13: qty 2

## 2018-05-13 MED ORDER — FENTANYL CITRATE (PF) 100 MCG/2ML IJ SOLN
INTRAMUSCULAR | Status: AC
  Filled 2018-05-13: qty 2

## 2018-05-13 MED ORDER — FENTANYL CITRATE (PF) 100 MCG/2ML IJ SOLN
INTRAMUSCULAR | Status: AC | PRN
  Administered 2018-05-13: 50 ug via INTRAVENOUS

## 2018-05-13 MED ORDER — GELATIN ABSORBABLE 12-7 MM EX MISC
CUTANEOUS | Status: AC
  Filled 2018-05-13: qty 1

## 2018-05-13 MED ORDER — HEPARIN SODIUM (PORCINE) 1000 UNIT/ML IJ SOLN
INTRAMUSCULAR | Status: DC
Start: 2018-05-13 — End: 2018-05-14
  Filled 2018-05-13: qty 1

## 2018-05-13 MED ORDER — MIDAZOLAM HCL 2 MG/2ML IJ SOLN
INTRAMUSCULAR | Status: AC | PRN
  Administered 2018-05-13: 1 mg via INTRAVENOUS

## 2018-05-13 MED ORDER — HEPARIN SODIUM (PORCINE) 1000 UNIT/ML DIALYSIS
1000.0000 [IU] | INTRAMUSCULAR | Status: DC | PRN
  Administered 2018-05-13: 3400 [IU] via INTRAVENOUS_CENTRAL
  Filled 2018-05-13 (×2): qty 1

## 2018-05-13 MED ORDER — CEFAZOLIN SODIUM-DEXTROSE 2-4 GM/100ML-% IV SOLN: 2.0000 g | Freq: Once | INTRAVENOUS | Status: DC

## 2018-05-13 MED ORDER — LIDOCAINE HCL 1 % IJ SOLN
INTRAMUSCULAR | Status: DC
Start: 2018-05-13 — End: 2018-05-14
  Filled 2018-05-13: qty 20

## 2018-05-13 MED ORDER — ATORVASTATIN CALCIUM 40 MG PO TABS
40.0000 mg | ORAL_TABLET | Freq: Every day | ORAL | Status: DC
  Administered 2018-05-13 – 2018-05-15 (×3): 40 mg via ORAL
  Filled 2018-05-13 (×4): qty 1

## 2018-05-13 MED ORDER — CEFAZOLIN SODIUM-DEXTROSE 2-4 GM/100ML-% IV SOLN
INTRAVENOUS | Status: AC
  Administered 2018-05-13: 2000 mg
  Filled 2018-05-13: qty 100

## 2018-05-13 MED ORDER — CHLORHEXIDINE GLUCONATE CLOTH 2 % EX PADS
6.0000 | MEDICATED_PAD | Freq: Every day | CUTANEOUS | Status: DC
  Administered 2018-05-13 – 2018-05-16 (×3): 6 via TOPICAL

## 2018-05-13 MED ORDER — HEPARIN SODIUM (PORCINE) 1000 UNIT/ML IJ SOLN
INTRAMUSCULAR | Status: AC
  Administered 2018-05-13: 3400 [IU] via INTRAVENOUS_CENTRAL
  Filled 2018-05-13: qty 1

## 2018-05-13 MED ORDER — MIDAZOLAM HCL 2 MG/2ML IJ SOLN
INTRAMUSCULAR | Status: AC
  Filled 2018-05-13: qty 2

## 2018-05-13 MED ORDER — SODIUM CHLORIDE 0.9 % IV SOLN: INTRAVENOUS | Status: DC

## 2018-05-13 MED ORDER — LIDOCAINE HCL 1 % IJ SOLN
INTRAMUSCULAR | Status: AC | PRN
  Administered 2018-05-13: 15 mL

## 2018-05-13 NOTE — Procedures (Signed)
INITIAL HEMODIALYSIS TREATMENT NOTE:   2 hour (first ever) hemodialysis treatment completed via newly placed right IJ tunneled catheter. Exit site unremarkable. Goal met: 1 liter removed without interruption in ultrafiltration.  All blood was returned.  Hemodynamically stable throughout HD session.   Rockwell Alexandria, RN CDN

## 2018-05-13 NOTE — Consult Note (Signed)
Cardiology Consult    Patient ID: Wesley [name]; [id number]; 1961/07/11   Admit date: 05/12/2018 Date of Consult: 05/13/2018  Primary Care Provider: Arsenio Katz, NP Primary Cardiologist: New to [facility] - Dr. Bronson Ing  Patient Profile    Wesley [name] is a 57 y.o. male with past medical history of HTN and Stage 5 CKD who is being seen today for the evaluation of an NSTEMI at the request of Dr. Wynetta Emery.   History of Present Illness    Wesley [name] presented to [facility] ED on 05/12/2018 after being informed he was significantly anemic. In reviewing records, he had been having worsening fatigue and dyspnea on exertion over the past several weeks. He is followed by Dr. Lowanda Foster in the outpatient setting at the time of his Aranesp injection on 05/11/2018, hemoglobin was found to be 6.7.  Repeat labs in the ED showed WBC 15.6, Hgb 6.9, platelets 184, Na+ 137, K+ 4.4, and creatinine 11.71 (previously variable at 6.15 to 9.92 within the past two months). Initial troponin elevated to 6.17 with repeat values of 8.12, 8.00, and 6.60. CXR showed persistent pulmonary vascular congestion without frank edema or consolidation. Initial EKG shows normal sinus rhythm, heart rate 94, with diffuse T wave inversion along the lateral leads which is significantly more prominent when compared to prior imaging in 11/2017.  Nephrology was consulted at the time of admission and the patient just underwent insertion of a temporary dialysis catheter at [facility] this morning. He did receive 2 units of PRBCs upon admission and repeat hemoglobin this morning has improved to 7.8.  This afternoon he is doing well.  He recently returned from [facility].  He is soon to undergo dialysis.  He denies chest pain.  Exertional dyspnea has improved.  He has been trying to walk 2 to 3 miles on a daily basis for several months in order to try to get in better health prior to this.  He likes to hike and continue.  He is a Primary school teacher by  profession.  He has noticed weakening of his voice over the past few months.  He has no prior history of MI.  Past Medical History:  Diagnosis Date  . Anasarca   . Anemia   . Cellulitis   . Diabetes mellitus without complication ([place])   . Hypertension   . Renal disorder     Past Surgical History:  Procedure Laterality Date  . FOOT SURGERY     right  . INCISION AND DRAINAGE OF WOUND  04/03/2012   Procedure: IRRIGATION AND DEBRIDEMENT WOUND;  Surgeon: Scherry Ran, MD;  Location: [facility];  Service: General;  Laterality: Right;     Home Medications:  Prior to Admission medications   Medication Sig Start Date End Date Taking? Authorizing Provider  calcitRIOL (ROCALTROL) 0.5 MCG capsule Take 1 capsule (0.5 mcg total) by mouth daily. 12/21/17  Yes Isaac Bliss, Rayford Halsted, MD  calcium acetate (PHOSLO) 667 MG capsule Take 2 capsules (1,334 mg total) by mouth 3 (three) times daily with meals. 12/21/17  Yes Isaac Bliss, Rayford Halsted, MD  calcium acetate (PHOSLO) 667 MG capsule Take 1 capsule (667 mg total) by mouth with snacks. 12/21/17  Yes Isaac Bliss, Rayford Halsted, MD  DUREZOL 0.05 % EMUL 1 DROP IN LEFT EYE TWICE A DAY START AFTER SURGERY AND CONTINUE UNTIL DOCTOR INSTRUCTS DIFFERENTLY 03/25/18  Yes [provider]  FERREX 150 150 MG capsule Take 150 mg by mouth daily. 04/27/18  Yes [provider]  Omega-3 Fatty Acids (FISH OIL PO) Take 1 tablet by mouth daily.   Yes [provider]  PROLENSA 0.07 % SOLN INSTILL 1 DROP INTO LEFT EYE DAILY, START 5 DAYS PRIOR TO SURGERY AND CONTINUE UNTIL GONE 03/15/18  Yes [provider]  sodium bicarbonate 650 MG tablet Take 1 tablet (650 mg total) by mouth 3 (three) times daily. 12/21/17  Yes Isaac Bliss, Rayford Halsted, MD  torsemide (DEMADEX) 20 MG tablet Take 40 mg by mouth daily. 02/26/18  Yes [provider]    Inpatient Medications: Scheduled Meds: . calcitRIOL  0.5 mcg Oral Daily  . calcium acetate   1,334 mg Oral TID WC  . calcium acetate  667 mg Oral With snacks  . Chlorhexidine Gluconate Cloth  6 each Topical Q0600  . iron polysaccharides  150 mg Oral Daily  . sodium bicarbonate  650 mg Oral TID  . sodium chloride flush  3 mL Intravenous Q12H  . torsemide  40 mg Oral Daily  . tuberculin  5 Units Intradermal Once   Continuous Infusions: . sodium chloride     PRN Meds: sodium chloride, acetaminophen **OR** acetaminophen, sodium chloride flush  Allergies:   No Known Allergies  Social History:   Social History   Socioeconomic History  . Marital status: Married    Spouse name: Not on file  . Number of children: Not on file  . Years of education: Not on file  . Highest education level: Not on file  Occupational History  . Not on file  Social Needs  . Financial resource strain: Not on file  . Food insecurity:    Worry: Not on file    Inability: Not on file  . Transportation needs:    Medical: Not on file    Non-medical: Not on file  Tobacco Use  . Smoking status: Never Smoker  . Smokeless tobacco: Never Used  Substance and Sexual Activity  . Alcohol use: Not Currently    Alcohol/week: 0.0 standard drinks    Comment: rarely  . Drug use: No  . Sexual activity: Not on file  Lifestyle  . Physical activity:    Days per week: Not on file    Minutes per session: Not on file  . Stress: Not on file  Relationships  . Social connections:    Talks on phone: Not on file    Gets together: Not on file    Attends religious service: Not on file    Active member of club or organization: Not on file    Attends meetings of clubs or organizations: Not on file    Relationship status: Not on file  . Intimate partner violence:    Fear of current or ex partner: Not on file    Emotionally abused: Not on file    Physically abused: Not on file    Forced sexual activity: Not on file  Other Topics Concern  . Not on file  Social History Narrative  . Not on file     Family  History:    Family History  Problem Relation Age of Onset  . Cancer - Lung Father   . Congestive Heart Failure Mother   . Diabetes Mellitus II Mother   . Cervical cancer Mother   . Dementia Mother       Review of Systems    General:  No chills, fever, night sweats or weight changes. Positive for fatigue.  Cardiovascular:  No chest pain, edema, orthopnea, palpitations,  paroxysmal nocturnal dyspnea. Positive for dyspnea on exertion.  Dermatological: No rash, lesions/masses Respiratory: No cough, dyspnea Urologic: No hematuria, dysuria Abdominal:   No nausea, vomiting, diarrhea, bright red blood per rectum, melena, or hematemesis Neurologic:  No visual changes, wkns, changes in mental status. All other systems reviewed and are otherwise negative except as noted above.  Physical Exam/Data    Vitals:   05/13/18 0323 05/13/18 0415 05/13/18 0430 05/13/18 0630  BP: 124/68 131/76  (!) 145/78  Pulse: 78 87  82  Resp:  16    Temp: 98.8 F (37.1 C) 99.1 F (37.3 C) 98.8 F (37.1 C) 98.6 F (37 C)  TempSrc: Oral Oral  Oral  SpO2: 98% 96%  94%  Weight:      Height:        Intake/Output Summary (Last 24 hours) at 05/13/2018 1100 Last data filed at 05/13/2018 0415 Gross per 24 hour  Intake 783 ml  Output -  Net 783 ml   Filed Weights   05/12/18 1221  Weight: 127 kg   Body mass index is 39.61 kg/m.   General: Pleasant, NAD Psych: Normal affect. Neuro: Alert and oriented X 3. Moves all extremities spontaneously. HEENT: Normal  Neck: Supple without bruits or JVD. Lungs:  Resp regular and unlabored, CTA. Heart: RRR no s3, s4, or murmurs. Abdomen: Soft, non-tender, non-distended Extremities: 1+ bilateral lower extremity edema.  Labs/Studies     Relevant CV Studies:  Echocardiogram: 09/13/2014 Study Conclusions  - Procedure narrative: Transthoracic echocardiography. Image quality was suboptimal. The study was technically difficult, as a result of poor sound wave  transmission and body habitus. Intravenous contrast (Definity) was administered. - Left ventricle: The cavity size was normal. There was moderate concentric hypertrophy. Systolic function was normal. The estimated ejection fraction was in the range of 55% to 60%. While wall motion was difficult to assess in spite of contrast enhancement, there were no gross regional wall motion abnormalities. Features are consistent with a pseudonormal left ventricular filling pattern, with concomitant abnormal relaxation and increased filling pressure (grade 2 diastolic dysfunction). Doppler parameters are consistent with high ventricular filling pressure. - Mitral valve: There was mild regurgitation. - Left atrium: The atrium was moderately to severely dilated. - Atrial septum: A patent foramen ovale cannot be excluded. - Tricuspid valve: There was mild regurgitation. - Systemic veins: IVC dilated, with normal respiratory variation. Estimated CVP 8 mmHg.  Laboratory Data:  Chemistry Recent Labs  Lab 05/12/18 1306 05/13/18 0728  NA 137 137  138  K 4.4 4.5  4.5  CL 96* 96*  96*  CO2 27 26  26   GLUCOSE 173* 109*  112*  BUN 93* 102*  101*  CREATININE 11.71* 11.73*  11.79*  CALCIUM 8.2* 8.5*  8.5*  GFRNONAA 4* 4*  4*  GFRAA 5* 5*  5*  ANIONGAP 14 15  16*    Recent Labs  Lab 05/12/18 1306 05/13/18 0728  PROT 6.6  --   ALBUMIN 2.5* 2.6*  AST 47*  --   ALT 37  --   ALKPHOS 79  --   BILITOT 0.9  --    Hematology Recent Labs  Lab 05/11/18 1434 05/12/18 1306 05/13/18 0728  WBC  --  15.6* 11.8*  RBC  --  2.44* 2.74*  HGB 7.1* 6.9* 7.8*  HCT 23.4* 22.2* 24.9*  MCV  --  91.0 90.9  MCH  --  28.3 28.5  MCHC  --  31.1 31.3  RDW  --  14.9  15.2  PLT  --  184 187   Cardiac Enzymes Recent Labs  Lab 05/12/18 1503 05/12/18 2134 05/13/18 0728  TROPONINI 8.12* 8.00* 6.60*    Recent Labs  Lab 05/12/18 1432  TROPIPOC 6.17*    BNPNo results for  input(s): BNP, PROBNP in the last 168 hours.  DDimer No results for input(s): DDIMER in the last 168 hours.  Radiology/Studies:  Dg Chest 2 View  Result Date: 05/12/2018 CLINICAL DATA:  Shortness of breath EXAM: CHEST - 2 VIEW COMPARISON:  December 17, 2017 FINDINGS: There is no appreciable edema or consolidation. There is cardiomegaly with pulmonary venous hypertension. No adenopathy. There is aortic atherosclerosis. No bone lesions. IMPRESSION: Persistent pulmonary vascular congestion without frank edema or consolidation. There is aortic atherosclerosis. Aortic Atherosclerosis (ICD10-I70.0). Electronically Signed   By: Lowella Grip III M.D.   On: 05/12/2018 13:19     Assessment & Plan    1. NSTEMI: I suspect troponin elevation and ECG abnormalities are secondary to demand ischemia in the context of severe anemia and renal failure.  He is currently symptomatically stable.  I will order a 2-D echocardiogram with Doppler to evaluate cardiac structure, function, and regional wall motion.  He is not a candidate for antiplatelet therapy at the present time. I am unable to add ACE inhibitors, angiotensin receptor blockers, or angiotensin receptor-neprilysin inhibitors due to advanced chronic kidney disease. He is soon to be dialyzed this evening.  I would consider beta-blockers but only after assessing blood pressure and heart rate after dialysis.  An outpatient stress test can be considered.  For the time being I will start atorvastatin 40 mg.  2.  Hypertension: He is hypertensive but is soon to undergo dialysis.  Once assessing vital signs after dialysis, beta-blockers can be considered.  3. Severe Anemia: He received 2 units packed red blood cell transfusion and symptoms have improved.  4. ESRD: Temporary dialysis catheter has been placed.  He will undergo 2 hours of dialysis this evening followed by 3 hours tomorrow.  5.  Chronic diastolic congestive heart failure: He is going to be dialyzed  tonight for volume management.  He still makes urine and takes torsemide. I will order a 2-D echocardiogram with Doppler to evaluate cardiac structure, function, and regional wall motion.    For questions or updates, please contact [place] Please consult [email or web address] for contact info under Cardiology/STEMI.  Signed, Kate Sable, MD, [facility] 05/13/2018, 5 pm

## 2018-05-13 NOTE — Progress Notes (Signed)
Subjective: Interval History: has no complaint of nausea or vomiting.  Patient also denies any difficulty breathing.  Objective: Vital signs in last 24 hours: Temp:  [98.1 F (36.7 C)-99.1 F (37.3 C)] 98.6 F (37 C) (08/16 0630) Pulse Rate:  [78-98] 82 (08/16 0630) Resp:  [16-20] 16 (08/16 0415) BP: (124-151)/(66-78) 145/78 (08/16 0630) SpO2:  [93 %-98 %] 94 % (08/16 0630) Weight:  [616 kg] 127 kg (08/15 1221) Weight change:   Intake/Output from previous day: 08/15 0701 - 08/16 0700 In: 783 [P.O.:240; I.V.:23; Blood:520] Out: -  Intake/Output this shift: No intake/output data recorded.  General appearance: alert, cooperative and no distress Resp: clear to auscultation bilaterally Cardio: regular rate and rhythm Extremities: edema Trace 1+ edema  Lab Results: Recent Labs    05/12/18 1306 05/13/18 0728  WBC 15.6* 11.8*  HGB 6.9* 7.8*  HCT 22.2* 24.9*  PLT 184 187   BMET:  Recent Labs    05/12/18 1306  NA 137  K 4.4  CL 96*  CO2 27  GLUCOSE 173*  BUN 93*  CREATININE 11.71*  CALCIUM 8.2*   No results for input(s): PTH in the last 72 hours. Iron Studies: No results for input(s): IRON, TIBC, TRANSFERRIN, FERRITIN in the last 72 hours.  Studies/Results: Dg Chest 2 View  Result Date: 05/12/2018 CLINICAL DATA:  Shortness of breath EXAM: CHEST - 2 VIEW COMPARISON:  December 17, 2017 FINDINGS: There is no appreciable edema or consolidation. There is cardiomegaly with pulmonary venous hypertension. No adenopathy. There is aortic atherosclerosis. No bone lesions. IMPRESSION: Persistent pulmonary vascular congestion without frank edema or consolidation. There is aortic atherosclerosis. Aortic Atherosclerosis (ICD10-I70.0). Electronically Signed   By: Lowella Grip III M.D.   On: 05/12/2018 13:19    I have reviewed the patient's current medications.  Assessment/Plan: 1] renal failure: Possibly acute on chronic versus natural progression of his disease.  Presently  patient denies any nausea or vomiting.  Potassium is normal. 2] anemia: Is status post blood transfusion his hemoglobin is better. 3] hypertension: His blood pressure is reasonably controlled 4] bone and mineral disorder: Presently he is on a binder.  Phosphorus is pending. 5] diabetes 7] fluid management: Patient has some edema but no difficulty breathing. 8] obesity Plan: 1] we will make arrangement for patient to get dialysis today for 2 hours.  We will remove about 1 L today 2] we will dialyze him tomorrow for 3-1/2 hours.  3] once an arrangement is made for out patient dialysis unit at [facility] we will send patient to be followed as an outpatient. 4] we will check his renal panel and CBC in the morning 5] we will start on Epogen 6000 units IV after each dialysis   LOS: 1 day   Cristine Daw S 05/13/2018,8:06 AM

## 2018-05-13 NOTE — Progress Notes (Signed)
Start Blood infusing at 40ml/hr Would not scan error message "Unit does not match blood bank information for this infusion" Blood verified by Darius Bump and Nira Conn

## 2018-05-13 NOTE — Progress Notes (Signed)
UNMATCHED BLOOD PRODUCT NOTE  Compare the patient ID on the blood tag to the patient ID on the hospital armband and Blood Bank armband. Then confirm the unit number on the blood tag matches the unit number on the blood product.  If a discrepancy is discovered return the product to blood bank immediately.   Blood Product Type: Packed Red Blood Cells  Unit #: [id number]  Product Code #: L6859V23   Start Time: 0403  Starting Rate: 50 ml/hr  Rate increase/decreased  (if applicable):      ml/hr  Rate changed time (if applicable):    Stop Time:    All Other Documentation should be documented within the Blood Admin Flowsheet per policy.

## 2018-05-13 NOTE — Clinical Social Work Note (Signed)
Available dialysis clinicals were sent to [facility] Intake.     Venda Dice, Clydene Pugh, LCSW

## 2018-05-13 NOTE — Progress Notes (Signed)
Blood complete.

## 2018-05-13 NOTE — Progress Notes (Signed)
Called report to [facility] IR nurse, [place].

## 2018-05-13 NOTE — Consult Note (Signed)
Chief Complaint: Patient was seen in consultation today for renal failure  Referring Physician(s): Fran Lowes  Supervising Physician: Markus Daft  Patient Status: [facility] - In-pt  History of Present Illness: Wesley [name] is a 57 y.o. male with past medical history of DM, HTN, and CKD who was referred to the ED by his MD for urgent dialysis. Patient in need of access for dialysis.  IR consulted for tunneled dialysis catheter placement at the request of Dr. Lowanda Foster.  Patient presents to radiology department for placement.  His hemoglobin was low this AM and he received blood transfusion with improvement in anemia.  He has been NPO today.  INR 1.41   Past Medical History:  Diagnosis Date  . Anasarca   . Anemia   . Cellulitis   . Diabetes mellitus without complication ([place])   . Hypertension   . Renal disorder     Past Surgical History:  Procedure Laterality Date  . FOOT SURGERY     right  . INCISION AND DRAINAGE OF WOUND  04/03/2012   Procedure: IRRIGATION AND DEBRIDEMENT WOUND;  Surgeon: Scherry Ran, MD;  Location: [facility] ORS;  Service: General;  Laterality: Right;    Allergies: Patient has no known allergies.  Medications: Prior to Admission medications   Medication Sig Start Date End Date Taking? Authorizing Provider  calcitRIOL (ROCALTROL) 0.5 MCG capsule Take 1 capsule (0.5 mcg total) by mouth daily. 12/21/17   Isaac Bliss, Rayford Halsted, MD  calcium acetate (PHOSLO) 667 MG capsule Take 2 capsules (1,334 mg total) by mouth 3 (three) times daily with meals. 12/21/17   Isaac Bliss, Rayford Halsted, MD  calcium acetate (PHOSLO) 667 MG capsule Take 1 capsule (667 mg total) by mouth with snacks. 12/21/17   Isaac Bliss, Rayford Halsted, MD  DUREZOL 0.05 % EMUL 1 DROP IN LEFT EYE TWICE A DAY START AFTER SURGERY AND CONTINUE UNTIL DOCTOR INSTRUCTS DIFFERENTLY 03/25/18   [provider]  FERREX 150 150 MG capsule Take 150 mg by mouth daily. 04/27/18   [provider]  Omega-3 Fatty Acids (FISH OIL PO) Take 1 tablet by mouth daily.    [provider]  PROLENSA 0.07 % SOLN INSTILL 1 DROP INTO LEFT EYE DAILY, START 5 DAYS PRIOR TO SURGERY AND CONTINUE UNTIL GONE 03/15/18   [provider]  sodium bicarbonate 650 MG tablet Take 1 tablet (650 mg total) by mouth 3 (three) times daily. 12/21/17   Isaac Bliss, Rayford Halsted, MD  torsemide (DEMADEX) 20 MG tablet Take 40 mg by mouth daily. 02/26/18   [provider]     Family History  Problem Relation Age of Onset  . Cancer - Lung Father   . Congestive Heart Failure Mother   . Diabetes Mellitus II Mother   . Cervical cancer Mother   . Dementia Mother     Social History   Socioeconomic History  . Marital status: Married    Spouse name: Not on file  . Number of children: Not on file  . Years of education: Not on file  . Highest education level: Not on file  Occupational History  . Not on file  Social Needs  . Financial resource strain: Not on file  . Food insecurity:    Worry: Not on file    Inability: Not on file  . Transportation needs:    Medical: Not on file    Non-medical: Not on file  Tobacco Use  . Smoking status: Never Smoker  . Smokeless  tobacco: Never Used  Substance and Sexual Activity  . Alcohol use: Not Currently    Alcohol/week: 0.0 standard drinks    Comment: rarely  . Drug use: No  . Sexual activity: Not on file  Lifestyle  . Physical activity:    Days per week: Not on file    Minutes per session: Not on file  . Stress: Not on file  Relationships  . Social connections:    Talks on phone: Not on file    Gets together: Not on file    Attends religious service: Not on file    Active member of club or organization: Not on file    Attends meetings of clubs or organizations: Not on file    Relationship status: Not on file  Other Topics Concern  . Not on file  Social History Narrative  . Not on file     Review of Systems: A 12  point ROS discussed and pertinent positives are indicated in the HPI above.  All other systems are negative.  Review of Systems  Constitutional: Negative for fatigue and fever.  Respiratory: Negative for cough and shortness of breath.   Cardiovascular: Negative for chest pain.  Gastrointestinal: Negative for abdominal pain.  Musculoskeletal: Negative for back pain.  Psychiatric/Behavioral: Negative for behavioral problems and confusion.    Vital Signs: BP (!) 156/91 (BP Location: Left Arm)   Pulse 83   Resp 20   SpO2 93%   Physical Exam  Constitutional: He is oriented to person, place, and time. He appears well-developed.  Cardiovascular: Normal rate, regular rhythm and normal heart sounds.  Pulmonary/Chest: Effort normal and breath sounds normal. No respiratory distress.  Abdominal: Soft. Bowel sounds are normal. He exhibits no distension. There is no tenderness.  Neurological: He is alert and oriented to person, place, and time.  Skin: Skin is warm and dry.  Psychiatric: He has a normal mood and affect. His behavior is normal. Judgment and thought content normal.  Nursing note and vitals reviewed.    MD Evaluation Airway: WNL Heart: WNL Abdomen: WNL Chest/ Lungs: WNL ASA  Classification: 2 Mallampati/Airway Score: Three   Imaging: Dg Chest 2 View  Result Date: 05/12/2018 CLINICAL DATA:  Shortness of breath EXAM: CHEST - 2 VIEW COMPARISON:  December 17, 2017 FINDINGS: There is no appreciable edema or consolidation. There is cardiomegaly with pulmonary venous hypertension. No adenopathy. There is aortic atherosclerosis. No bone lesions. IMPRESSION: Persistent pulmonary vascular congestion without frank edema or consolidation. There is aortic atherosclerosis. Aortic Atherosclerosis (ICD10-I70.0). Electronically Signed   By: Lowella Grip III M.D.   On: 05/12/2018 13:19    Labs:  CBC: Recent Labs    12/21/17 0408  02/02/18 0821  05/04/18 0749  05/11/18 1429  05/11/18 1434 05/12/18 1306 05/13/18 0728  WBC 6.9  --  8.9  --   --   --   --   --  15.6* 11.8*  HGB 7.7*   < > 10.0*   < > 7.7*   < > 6.7* 7.1* 6.9* 7.8*  HCT 25.3*  --  32.9*   < > 25.3*  --   --  23.4* 22.2* 24.9*  PLT 183  --  246  --   --   --   --   --  184 187   < > = values in this interval not displayed.    COAGS: Recent Labs    05/12/18 1306  INR 1.41    BMP: Recent Labs  02/23/18 0816 05/04/18 0749 05/12/18 1306 05/13/18 0728  NA 138 139 137 137  138  K 5.2* 5.0 4.4 4.5  4.5  CL 104 104 96* 96*  96*  CO2 23 24 27 26  26   GLUCOSE 134* 113* 173* 109*  112*  BUN 68* 83* 93* 102*  101*  CALCIUM 9.3  9.1 8.4*  8.4* 8.2* 8.5*  8.5*  CREATININE 6.15* 9.92* 11.71* 11.73*  11.79*  GFRNONAA 9* 5* 4* 4*  4*  GFRAA 10* 6* 5* 5*  5*    LIVER FUNCTION TESTS: Recent Labs    12/17/17 2050  02/23/18 0816 05/04/18 0749 05/12/18 1306 05/13/18 0728  BILITOT 0.4  --   --   --  0.9  --   AST 12*  --   --   --  47*  --   ALT 14*  --   --   --  37  --   ALKPHOS 76  --   --   --  79  --   PROT 6.8  --   --   --  6.6  --   ALBUMIN 2.8*   < > 3.1* 2.9* 2.5* 2.6*   < > = values in this interval not displayed.    TUMOR MARKERS: No results for input(s): AFPTM, CEA, CA199, CHROMGRNA in the last 8760 hours.  Assessment and Plan: Patient with past medical history of renal failure presents with need of urgent dialysis.  IR consulted for tunneled dialysis catheter placement at the request of Dr. Lowanda Foster. Case reviewed by Dr. Anselm Pancoast who approves patient for procedure.  Patient presents today in their usual state of health.  He has been NPO and is not currently on blood thinners.   Risks and benefits discussed with the patient including, but not limited to bleeding, infection, vascular injury, pneumothorax which may require chest tube placement, air embolism or even death  All of the patient's questions were answered, patient is agreeable to proceed. Consent  signed and in chart.  Thank you for this interesting consult.  I greatly enjoyed meeting Somnang Mahan and look forward to participating in their care.  A copy of this report was sent to the requesting provider on this date.  Electronically Signed: Docia Barrier, PA 05/13/2018, 10:28 AM   I spent a total of 20 Minutes    in face to face in clinical consultation, greater than 50% of which was counseling/coordinating care for renal failure.

## 2018-05-13 NOTE — Progress Notes (Signed)
PROGRESS NOTE   Wesley [name]  EZM:[id number]  DOB: 1961-07-03  DOA: 05/12/2018 PCP: Arsenio Katz, NP   Brief Admission Hx: 57 y.o. male with medical history significant of end-stage renal disease declining for several months has been refusing dialysis and putting it off, chronic anemia, diabetes comes in with progressive worsening shortness of breath.   MDM/Assessment & Plan:   1. End-stage renal disease-the patient has been seen by nephrology and sent to [facility] for temporary dialysis access placement.  The plan is for him to begin dialysis today for 2 hours.  In addition, he will receive hemodialysis for 3 hours tomorrow.  Continue to monitor electrolytes.  Follow recommendations of nephrology team. 2. Severe anemia-he is status post 2 units red blood cell transfusion and feeling much better.  Follow CBC. 3. NSTEMI-troponin is starting to trend down.  Cardiology is consulted and will see him today.  It is likely that demand ischemia is contributing to his elevated troponins. 4. Diabetes mellitus type 2-monitoring CBGs and sliding scale coverage ordered. 5. Essential hypertension-he has been restarted on his home medications and will follow. 6. Chronic diastolic congestive heart failure-he is volume overloaded which should be treated with hemodialysis as ordered by the nephrology team. 7. Shortness of breath-likely secondary to severe anemia and uremia and volume overload which is being treated and addressed.  His symptoms are improved after 2 units of packed red blood cell transfusion. 8. Left leg abrasion-topical wound care.  DVT prophylaxis: SCDs Code Status: Full Family Communication: Wife at bedside Disposition Plan: Patient not medically ready for discharge he needs urgent hemodialysis treatments   Consultants:  Nephrology and cardiology  Procedures:  8/16 temporary dialysis access placement  8/16 hemodialysis initiation treatment  Subjective: Patient says he  feels less short of breath after transfusion but still does not feel back to his baseline.  Objective: Vitals:   05/13/18 0323 05/13/18 0415 05/13/18 0430 05/13/18 0630  BP: 124/68 131/76  (!) 145/78  Pulse: 78 87  82  Resp:  16    Temp: 98.8 F (37.1 C) 99.1 F (37.3 C) 98.8 F (37.1 C) 98.6 F (37 C)  TempSrc: Oral Oral  Oral  SpO2: 98% 96%  94%  Weight:      Height:        Intake/Output Summary (Last 24 hours) at 05/13/2018 1040 Last data filed at 05/13/2018 0415 Gross per 24 hour  Intake 783 ml  Output -  Net 783 ml   Filed Weights   05/12/18 1221  Weight: 127 kg     REVIEW OF SYSTEMS  As per history otherwise all reviewed and reported negative  Exam:  General exam: Morbidly obese male chronically ill-appearing in no apparent distress cooperative. Respiratory system: Shallow breath sounds bilateral.  No increased work of breathing. Cardiovascular system: S1 & S2 heard.  Mild JVD. Gastrointestinal system: Abdomen is nondistended, soft and nontender. Normal bowel sounds heard. Central nervous system: Alert and oriented. No focal neurological deficits. Extremities: Bilateral lower extremity edema feet to knees.  Data Reviewed: Basic Metabolic Panel: Recent Labs  Lab 05/12/18 1306 05/13/18 0728  NA 137 137  138  K 4.4 4.5  4.5  CL 96* 96*  96*  CO2 27 26  26   GLUCOSE 173* 109*  112*  BUN 93* 102*  101*  CREATININE 11.71* 11.73*  11.79*  CALCIUM 8.2* 8.5*  8.5*  PHOS  --  8.9*   Liver Function Tests: Recent Labs  Lab 05/12/18 1306 05/13/18 5035  AST 47*  --   ALT 37  --   ALKPHOS 79  --   BILITOT 0.9  --   PROT 6.6  --   ALBUMIN 2.5* 2.6*   Recent Labs  Lab 05/12/18 1306  LIPASE 24   No results for input(s): AMMONIA in the last 168 hours. CBC: Recent Labs  Lab 05/11/18 1429 05/11/18 1434 05/12/18 1306 05/13/18 0728  WBC  --   --  15.6* 11.8*  NEUTROABS  --   --  14.0*  --   HGB 6.7* 7.1* 6.9* 7.8*  HCT  --  23.4* 22.2*  24.9*  MCV  --   --  91.0 90.9  PLT  --   --  184 187   Cardiac Enzymes: Recent Labs  Lab 05/12/18 1503 05/12/18 2134 05/13/18 0728  TROPONINI 8.12* 8.00* 6.60*   CBG (last 3)  Recent Labs    05/12/18 1632  GLUCAP 105*   Recent Results (from the past 240 hour(s))  MRSA PCR Screening     Status: None   Collection Time: 05/12/18  4:27 PM  Result Value Ref Range Status   MRSA by PCR NEGATIVE NEGATIVE Final    Comment:        The GeneXpert MRSA Assay (FDA approved for NASAL specimens only), is one component of a comprehensive MRSA colonization surveillance program. It is not intended to diagnose MRSA infection nor to guide or monitor treatment for MRSA infections. Performed at [facility], [address], [place], [place] [address]      Studies: Dg Chest 2 View  Result Date: 05/12/2018 CLINICAL DATA:  Shortness of breath EXAM: CHEST - 2 VIEW COMPARISON:  December 17, 2017 FINDINGS: There is no appreciable edema or consolidation. There is cardiomegaly with pulmonary venous hypertension. No adenopathy. There is aortic atherosclerosis. No bone lesions. IMPRESSION: Persistent pulmonary vascular congestion without frank edema or consolidation. There is aortic atherosclerosis. Aortic Atherosclerosis (ICD10-I70.0). Electronically Signed   By: Lowella Grip III M.D.   On: 05/12/2018 13:19   Scheduled Meds: . calcitRIOL  0.5 mcg Oral Daily  . calcium acetate  1,334 mg Oral TID WC  . calcium acetate  667 mg Oral With snacks  . Chlorhexidine Gluconate Cloth  6 each Topical Q0600  . iron polysaccharides  150 mg Oral Daily  . sodium bicarbonate  650 mg Oral TID  . sodium chloride flush  3 mL Intravenous Q12H  . torsemide  40 mg Oral Daily  . tuberculin  5 Units Intradermal Once   Continuous Infusions: . sodium chloride      Principal Problem:   Normocytic anemia symptomatic Active Problems:   Diabetes mellitus (HCC)   HTN (hypertension)   Chronic diastolic CHF  (congestive heart failure) (HCC)   CKD (chronic kidney disease), stage IV (HCC)   SOB (shortness of breath)   NSTEMI (non-ST elevated myocardial infarction) (HCC)   Symptomatic anemia  Time spent:   Irwin Brakeman, MD, FAAFP [facility] Pager [phone]  If 7PM-7AM, please contact night-coverage [email or web address] Password [email or web address] 05/13/2018, 10:40 AM    LOS: 1 day

## 2018-05-13 NOTE — Progress Notes (Signed)
Patient just picked up by [facility] to go to [facility] IR.

## 2018-05-13 NOTE — Procedures (Signed)
  Pre-operative Diagnosis: Chronic kidney disease and starting dialysis       Post-operative Diagnosis:  Chronic kidney disease and starting dialysis     Indications: Needs hemodialysis access  Procedure: Placement of tunneled dialysis catheter placement  Findings: Tip at SVC/RA junction.  23 cm tip to cuff Palindrome catheter.  Complications: None     EBL: Minimal  Plan: HD catheter is ready to use.

## 2018-05-13 NOTE — Progress Notes (Signed)
Waiting on [facility] truck to pick up pt. Called to verify pick up at 7:45am and [facility] said the trucks were out and would get here for pt as soon as possible.

## 2018-05-14 ENCOUNTER — Other Ambulatory Visit (HOSPITAL_COMMUNITY): Payer: Commercial Managed Care - PPO

## 2018-05-14 LAB — CBC
HCT: 26.6 % — ABNORMAL LOW (ref 39.0–52.0)
HEMOGLOBIN: 8.1 g/dL — AB (ref 13.0–17.0)
MCH: 28 pg (ref 26.0–34.0)
MCHC: 30.5 g/dL (ref 30.0–36.0)
MCV: 92 fL (ref 78.0–100.0)
Platelets: 221 10*3/uL (ref 150–400)
RBC: 2.89 MIL/uL — ABNORMAL LOW (ref 4.22–5.81)
RDW: 15.3 % (ref 11.5–15.5)
WBC: 9.1 10*3/uL (ref 4.0–10.5)

## 2018-05-14 LAB — IRON AND TIBC
Iron: 19 ug/dL — ABNORMAL LOW (ref 45–182)
SATURATION RATIOS: 8 % — AB (ref 17.9–39.5)
TIBC: 232 ug/dL — AB (ref 250–450)
UIBC: 213 ug/dL

## 2018-05-14 LAB — RENAL FUNCTION PANEL
Albumin: 2.5 g/dL — ABNORMAL LOW (ref 3.5–5.0)
Anion gap: 16 — ABNORMAL HIGH (ref 5–15)
BUN: 89 mg/dL — ABNORMAL HIGH (ref 6–20)
CALCIUM: 8.5 mg/dL — AB (ref 8.9–10.3)
CO2: 27 mmol/L (ref 22–32)
CREATININE: 9.67 mg/dL — AB (ref 0.61–1.24)
Chloride: 95 mmol/L — ABNORMAL LOW (ref 98–111)
GFR calc non Af Amer: 5 mL/min — ABNORMAL LOW (ref >60)
GFR, EST AFRICAN AMERICAN: 6 mL/min — AB (ref >60)
GLUCOSE: 119 mg/dL — AB (ref 70–99)
Phosphorus: 7.5 mg/dL — ABNORMAL HIGH (ref 2.5–4.6)
Potassium: 4.2 mmol/L (ref 3.5–5.1)
Sodium: 138 mmol/L (ref 135–145)

## 2018-05-14 LAB — TYPE AND SCREEN
ABO/RH(D): A POS
ANTIBODY SCREEN: POSITIVE
UNIT DIVISION: 0
Unit division: 0

## 2018-05-14 LAB — BPAM RBC
BLOOD PRODUCT EXPIRATION DATE: 201909082359
Blood Product Expiration Date: 201909082359
ISSUE DATE / TIME: 201908152258
ISSUE DATE / TIME: 201908152258
UNIT TYPE AND RH: 6200
Unit Type and Rh: 6200

## 2018-05-14 LAB — FERRITIN: Ferritin: 485 ng/mL — ABNORMAL HIGH (ref 24–336)

## 2018-05-14 LAB — MAGNESIUM: Magnesium: 2.4 mg/dL (ref 1.7–2.4)

## 2018-05-14 MED ORDER — EPOETIN ALFA 3000 UNIT/ML IJ SOLN
6000.0000 [IU] | INTRAMUSCULAR | Status: DC
  Administered 2018-05-14: 6000 [IU] via SUBCUTANEOUS
  Filled 2018-05-14: qty 2

## 2018-05-14 MED ORDER — EPOETIN ALFA 10000 UNIT/ML IJ SOLN: 6000.0000 [IU] | INTRAMUSCULAR | Status: DC

## 2018-05-14 MED ORDER — SODIUM CHLORIDE 0.9 % IV SOLN: 100.0000 mL | INTRAVENOUS | Status: DC | PRN

## 2018-05-14 MED ORDER — SENNOSIDES-DOCUSATE SODIUM 8.6-50 MG PO TABS
1.0000 | ORAL_TABLET | Freq: Two times a day (BID) | ORAL | Status: DC
  Filled 2018-05-14 (×3): qty 1

## 2018-05-14 MED ORDER — EPOETIN ALFA 10000 UNIT/ML IJ SOLN
INTRAMUSCULAR | Status: AC
  Filled 2018-05-14: qty 1

## 2018-05-14 NOTE — Progress Notes (Signed)
Notified, Dr. Wynetta Emery of patient's PPD skin test having no induration. Received no response.

## 2018-05-14 NOTE — Progress Notes (Signed)
PROGRESS NOTE   Wesley [name]  IWL:[id number]  DOB: 1960/12/15  DOA: 05/12/2018 PCP: Arsenio Katz, NP   Brief Admission Hx: 57 y.o. male with medical history significant of end-stage renal disease declining for several months has been refusing dialysis and putting it off, chronic anemia, diabetes comes in with progressive worsening shortness of breath.   MDM/Assessment & Plan:   1. End-stage renal disease-the patient has been seen by nephrology and sent to [facility] for temporary dialysis access placement.  Pt began hemodialysis 8/16 for 2 hours.  In addition, he will receive hemodialysis for 3 hours today. Continue to monitor electrolytes.  Follow recommendations of nephrology team. 2. Severe anemia-he is status post 2 units red blood cell transfusion and feeling much better.  Follow CBC. 3. NSTEMI-troponin is starting to trend down.  Cardiology is consulted felt related to demand ischemia from severe anemia and superimposed renal failure.  Echo ordered and pending.   4. Diabetes mellitus type 2-bs have been well controlled.  5. Essential hypertension-stable, following. 6. Chronic diastolic congestive heart failure-he is volume overloaded which should be treated with hemodialysis as ordered by the nephrology team. 7. Shortness of breath-likely secondary to severe anemia and uremia and volume overload which is being treated and addressed.  His symptoms are improved after 2 units of packed red blood cell transfusion. 8. Left leg abrasion-topical wound care.  DVT prophylaxis: SCDs Code Status: Full Family Communication: Wife at bedside Disposition Plan: Patient not medically ready for discharge he needs further hemodialysis treatments   Consultants:  Nephrology and cardiology  Procedures:  8/16 temporary dialysis access placement  8/16 hemodialysis initiation treatment  8/17 hemodialysis   Subjective: Patient tolerated HD well yesterday, no complaints.    Objective: Vitals:   05/13/18 2015 05/13/18 2321 05/14/18 0500 05/14/18 0645  BP: (!) 150/76 126/71  (!) 141/70  Pulse: 86 90  85  Resp:  19  16  Temp:  98 F (36.7 C)  98.3 F (36.8 C)  TempSrc:  Oral  Oral  SpO2:  97%  94%  Weight:   119.9 kg   Height:        Intake/Output Summary (Last 24 hours) at 05/14/2018 0956 Last data filed at 05/14/2018 0847 Gross per 24 hour  Intake 540 ml  Output 1650 ml  Net -1110 ml   Filed Weights   05/12/18 1221 05/13/18 1750 05/14/18 0500  Weight: 127 kg 122.3 kg 119.9 kg     REVIEW OF SYSTEMS  As per history otherwise all reviewed and reported negative  Exam:  General exam: Morbidly obese male chronically ill-appearing in no apparent distress cooperative. Respiratory system: crackles heard bilateral.  No increased work of breathing. Cardiovascular system: S1 & S2 heard.  Mild JVD. Gastrointestinal system: Abdomen is nondistended, soft and nontender. Normal bowel sounds heard. Central nervous system: Alert and oriented. No focal neurological deficits. Extremities: Bilateral lower extremity edema feet to knees.  Data Reviewed: Basic Metabolic Panel: Recent Labs  Lab 05/12/18 1306 05/13/18 0728 05/14/18 0633  NA 137 137  138 138  K 4.4 4.5  4.5 4.2  CL 96* 96*  96* 95*  CO2 27 26  26 27   GLUCOSE 173* 109*  112* 119*  BUN 93* 102*  101* 89*  CREATININE 11.71* 11.73*  11.79* 9.67*  CALCIUM 8.2* 8.5*  8.5* 8.5*  MG  --   --  2.4  PHOS  --  8.9* 7.5*   Liver Function Tests: Recent Labs  Lab 05/12/18 1306  05/13/18 0728 05/14/18 0633  AST 47*  --   --   ALT 37  --   --   ALKPHOS 79  --   --   BILITOT 0.9  --   --   PROT 6.6  --   --   ALBUMIN 2.5* 2.6* 2.5*   Recent Labs  Lab 05/12/18 1306  LIPASE 24   No results for input(s): AMMONIA in the last 168 hours. CBC: Recent Labs  Lab 05/11/18 1429 05/11/18 1434 05/12/18 1306 05/13/18 0728 05/14/18 0633  WBC  --   --  15.6* 11.8* 9.1  NEUTROABS  --    --  14.0*  --   --   HGB 6.7* 7.1* 6.9* 7.8* 8.1*  HCT  --  23.4* 22.2* 24.9* 26.6*  MCV  --   --  91.0 90.9 92.0  PLT  --   --  184 187 221   Cardiac Enzymes: Recent Labs  Lab 05/12/18 1503 05/12/18 2134 05/13/18 0728  TROPONINI 8.12* 8.00* 6.60*   CBG (last 3)  Recent Labs    05/12/18 1632  GLUCAP 105*   Recent Results (from the past 240 hour(s))  MRSA PCR Screening     Status: None   Collection Time: 05/12/18  4:27 PM  Result Value Ref Range Status   MRSA by PCR NEGATIVE NEGATIVE Final    Comment:        The GeneXpert MRSA Assay (FDA approved for NASAL specimens only), is one component of a comprehensive MRSA colonization surveillance program. It is not intended to diagnose MRSA infection nor to guide or monitor treatment for MRSA infections. Performed at [facility], [address] [place], [place] [address]      Studies: Dg Chest 2 View  Result Date: 05/12/2018 CLINICAL DATA:  Shortness of breath EXAM: CHEST - 2 VIEW COMPARISON:  December 17, 2017 FINDINGS: There is no appreciable edema or consolidation. There is cardiomegaly with pulmonary venous hypertension. No adenopathy. There is aortic atherosclerosis. No bone lesions. IMPRESSION: Persistent pulmonary vascular congestion without frank edema or consolidation. There is aortic atherosclerosis. Aortic Atherosclerosis (ICD10-I70.0). Electronically Signed   By: Lowella Grip III M.D.   On: 05/12/2018 13:19   Ir Fluoro Guide Cv Line Right  Result Date: 05/13/2018 INDICATION: 57 year old with acute on chronic renal failure. Patient needs a catheter to start hemodialysis. EXAM: FLUOROSCOPIC AND ULTRASOUND GUIDED PLACEMENT OF A TUNNELED DIALYSIS CATHETER Physician: Stephan Minister. Anselm Pancoast, MD MEDICATIONS: Ancef 2 g; The antibiotic was administered within an appropriate time interval prior to skin puncture. ANESTHESIA/SEDATION: Versed 1.0 mg IV; Fentanyl 50 mcg IV; Moderate Sedation Time:  20 minutes The patient was continuously  monitored during the procedure by the interventional radiology nurse under my direct supervision. FLUOROSCOPY TIME:  Fluoroscopy Time: 1 minutes and 18 seconds, 14 mGy COMPLICATIONS: None immediate. PROCEDURE: Informed consent was obtained for placement of a tunneled dialysis catheter. The patient was placed supine on the interventional table. Ultrasound confirmed a patent right internal jugular vein. Ultrasound images were obtained for documentation. The right side of the neck was prepped and draped in a sterile fashion. The right side of the neck was anesthetized with 1% lidocaine. Maximal barrier sterile technique was utilized including caps, mask, sterile gowns, sterile gloves, sterile drape, hand hygiene and skin antiseptic. A small incision was made with #11 blade scalpel. A 21 gauge needle directed into the right internal jugular vein with ultrasound guidance. A micropuncture dilator set was placed. A 23 cm tip to  cuff Palindrome catheter was selected. The skin below the right clavicle was anesthetized and a small incision was made with an #11 blade scalpel. A subcutaneous tunnel was formed to the vein dermatotomy site. The catheter was brought through the tunnel. The vein dermatotomy site was dilated to accommodate a peel-away sheath. The catheter was placed through the peel-away sheath and directed into the central venous structures. The tip of the catheter was placed at the superior cavoatrial junction with fluoroscopy. Fluoroscopic images were obtained for documentation. Both lumens were found to aspirate and flush well. The proper amount of heparin was flushed in both lumens. The vein dermatotomy site was closed using a single layer of absorbable suture and Dermabond. Gel-Foam was placed in the subcutaneous tract. The catheter was secured to the skin using Prolene suture. FINDINGS: Catheter tip at the superior cavoatrial junction. IMPRESSION: Successful placement of a right jugular tunneled dialysis  catheter using ultrasound and fluoroscopic guidance. Electronically Signed   By: Markus Daft M.D.   On: 05/13/2018 17:23   Ir US Guide Vasc Access Right  Result Date: 05/13/2018 INDICATION: 57 year old with acute on chronic renal failure. Patient needs a catheter to start hemodialysis. EXAM: FLUOROSCOPIC AND ULTRASOUND GUIDED PLACEMENT OF A TUNNELED DIALYSIS CATHETER Physician: Stephan Minister. Anselm Pancoast, MD MEDICATIONS: Ancef 2 g; The antibiotic was administered within an appropriate time interval prior to skin puncture. ANESTHESIA/SEDATION: Versed 1.0 mg IV; Fentanyl 50 mcg IV; Moderate Sedation Time:  20 minutes The patient was continuously monitored during the procedure by the interventional radiology nurse under my direct supervision. FLUOROSCOPY TIME:  Fluoroscopy Time: 1 minutes and 18 seconds, 14 mGy COMPLICATIONS: None immediate. PROCEDURE: Informed consent was obtained for placement of a tunneled dialysis catheter. The patient was placed supine on the interventional table. Ultrasound confirmed a patent right internal jugular vein. Ultrasound images were obtained for documentation. The right side of the neck was prepped and draped in a sterile fashion. The right side of the neck was anesthetized with 1% lidocaine. Maximal barrier sterile technique was utilized including caps, mask, sterile gowns, sterile gloves, sterile drape, hand hygiene and skin antiseptic. A small incision was made with #11 blade scalpel. A 21 gauge needle directed into the right internal jugular vein with ultrasound guidance. A micropuncture dilator set was placed. A 23 cm tip to cuff Palindrome catheter was selected. The skin below the right clavicle was anesthetized and a small incision was made with an #11 blade scalpel. A subcutaneous tunnel was formed to the vein dermatotomy site. The catheter was brought through the tunnel. The vein dermatotomy site was dilated to accommodate a peel-away sheath. The catheter was placed through the peel-away  sheath and directed into the central venous structures. The tip of the catheter was placed at the superior cavoatrial junction with fluoroscopy. Fluoroscopic images were obtained for documentation. Both lumens were found to aspirate and flush well. The proper amount of heparin was flushed in both lumens. The vein dermatotomy site was closed using a single layer of absorbable suture and Dermabond. Gel-Foam was placed in the subcutaneous tract. The catheter was secured to the skin using Prolene suture. FINDINGS: Catheter tip at the superior cavoatrial junction. IMPRESSION: Successful placement of a right jugular tunneled dialysis catheter using ultrasound and fluoroscopic guidance. Electronically Signed   By: Markus Daft M.D.   On: 05/13/2018 17:23   Scheduled Meds: . atorvastatin  40 mg Oral q1800  . calcitRIOL  0.5 mcg Oral Daily  . calcium acetate  1,334 mg Oral  TID WC  . calcium acetate  667 mg Oral With snacks  . Chlorhexidine Gluconate Cloth  6 each Topical Q0600  . epoetin (EPOGEN/PROCRIT) injection  6,000 Units Subcutaneous Q T,Th,Sa-HD  . iron polysaccharides  150 mg Oral Daily  . senna-docusate  1 tablet Oral BID  . sodium bicarbonate  650 mg Oral TID  . sodium chloride flush  3 mL Intravenous Q12H  . torsemide  40 mg Oral Daily  . tuberculin  5 Units Intradermal Once   Continuous Infusions: . sodium chloride    . sodium chloride    . sodium chloride      Principal Problem:   Normocytic anemia symptomatic Active Problems:   Diabetes mellitus (HCC)   HTN (hypertension)   Chronic diastolic CHF (congestive heart failure) (HCC)   CKD (chronic kidney disease), stage IV (HCC)   SOB (shortness of breath)   NSTEMI (non-ST elevated myocardial infarction) (HCC)   Symptomatic anemia  Time spent:   Irwin Brakeman, MD, FAAFP [facility] Pager [phone]  If 7PM-7AM, please contact night-coverage [email or web address] Password [email or web address] 05/14/2018, 9:56 AM    LOS: 2 days

## 2018-05-14 NOTE — Procedures (Signed)
HEMODIALYSIS TREATMENT NOTE (New start, HD#2):   2 hours and 55 minutes of dialysis completed via right IJ tunneled catheter. Exit site unremarkable.  Goal nearly met.  Despite administration of NS flushes every 20 minutes, dialyzer began clotting with 5 minutes remaining and the treatment was terminated early so that all blood could be returned.  Net UF 1977cc.  Hemodynamically stable throughout HD session for which pt was sitting on the edge of the bed the entire treatment (per his request). Pt passed the time talking, engaging in dialysis-related education, and singing/playing his acoustic guitar.    Rockwell Alexandria, RN CDN

## 2018-05-14 NOTE — Progress Notes (Signed)
Subjective: Interval History: Patient presently offers no complaints.  He denies any nausea or vomiting.  Appetite is good  Objective: Vital signs in last 24 hours: Temp:  [98 F (36.7 C)-98.4 F (36.9 C)] 98.3 F (36.8 C) (08/17 0645) Pulse Rate:  [76-92] 85 (08/17 0645) Resp:  [15-21] 16 (08/17 0645) BP: (110-165)/(57-92) 141/70 (08/17 0645) SpO2:  [91 %-97 %] 94 % (08/17 0645) Weight:  [119.9 kg-122.3 kg] 119.9 kg (08/17 0500) Weight change: -4.7 kg  Intake/Output from previous day: 08/16 0701 - 08/17 0700 In: 300 [P.O.:300] Out: 1650 [Urine:650] Intake/Output this shift: No intake/output data recorded.  General appearance: alert, cooperative and no distress Resp: clear to auscultation bilaterally Cardio: regular rate and rhythm Extremities: edema Trace 1+ edema  Lab Results: Recent Labs    05/13/18 0728 05/14/18 0633  WBC 11.8* 9.1  HGB 7.8* 8.1*  HCT 24.9* 26.6*  PLT 187 221   BMET:  Recent Labs    05/13/18 0728 05/14/18 0633  NA 137  138 138  K 4.5  4.5 4.2  CL 96*  96* 95*  CO2 26  26 27   GLUCOSE 109*  112* 119*  BUN 102*  101* 89*  CREATININE 11.73*  11.79* 9.67*  CALCIUM 8.5*  8.5* 8.5*   No results for input(s): PTH in the last 72 hours. Iron Studies: No results for input(s): IRON, TIBC, TRANSFERRIN, FERRITIN in the last 72 hours.  Studies/Results: Dg Chest 2 View  Result Date: 05/12/2018 CLINICAL DATA:  Shortness of breath EXAM: CHEST - 2 VIEW COMPARISON:  December 17, 2017 FINDINGS: There is no appreciable edema or consolidation. There is cardiomegaly with pulmonary venous hypertension. No adenopathy. There is aortic atherosclerosis. No bone lesions. IMPRESSION: Persistent pulmonary vascular congestion without frank edema or consolidation. There is aortic atherosclerosis. Aortic Atherosclerosis (ICD10-I70.0). Electronically Signed   By: Lowella Grip III M.D.   On: 05/12/2018 13:19   Ir Fluoro Guide Cv Line Right  Result Date:  05/13/2018 INDICATION: 57 year old with acute on chronic renal failure. Patient needs a catheter to start hemodialysis. EXAM: FLUOROSCOPIC AND ULTRASOUND GUIDED PLACEMENT OF A TUNNELED DIALYSIS CATHETER Physician: Stephan Minister. Anselm Pancoast, MD MEDICATIONS: Ancef 2 g; The antibiotic was administered within an appropriate time interval prior to skin puncture. ANESTHESIA/SEDATION: Versed 1.0 mg IV; Fentanyl 50 mcg IV; Moderate Sedation Time:  20 minutes The patient was continuously monitored during the procedure by the interventional radiology nurse under my direct supervision. FLUOROSCOPY TIME:  Fluoroscopy Time: 1 minutes and 18 seconds, 14 mGy COMPLICATIONS: None immediate. PROCEDURE: Informed consent was obtained for placement of a tunneled dialysis catheter. The patient was placed supine on the interventional table. Ultrasound confirmed a patent right internal jugular vein. Ultrasound images were obtained for documentation. The right side of the neck was prepped and draped in a sterile fashion. The right side of the neck was anesthetized with 1% lidocaine. Maximal barrier sterile technique was utilized including caps, mask, sterile gowns, sterile gloves, sterile drape, hand hygiene and skin antiseptic. A small incision was made with #11 blade scalpel. A 21 gauge needle directed into the right internal jugular vein with ultrasound guidance. A micropuncture dilator set was placed. A 23 cm tip to cuff Palindrome catheter was selected. The skin below the right clavicle was anesthetized and a small incision was made with an #11 blade scalpel. A subcutaneous tunnel was formed to the vein dermatotomy site. The catheter was brought through the tunnel. The vein dermatotomy site was dilated to accommodate a peel-away sheath.  The catheter was placed through the peel-away sheath and directed into the central venous structures. The tip of the catheter was placed at the superior cavoatrial junction with fluoroscopy. Fluoroscopic images were  obtained for documentation. Both lumens were found to aspirate and flush well. The proper amount of heparin was flushed in both lumens. The vein dermatotomy site was closed using a single layer of absorbable suture and Dermabond. Gel-Foam was placed in the subcutaneous tract. The catheter was secured to the skin using Prolene suture. FINDINGS: Catheter tip at the superior cavoatrial junction. IMPRESSION: Successful placement of a right jugular tunneled dialysis catheter using ultrasound and fluoroscopic guidance. Electronically Signed   By: Markus Daft M.D.   On: 05/13/2018 17:23   Ir US Guide Vasc Access Right  Result Date: 05/13/2018 INDICATION: 57 year old with acute on chronic renal failure. Patient needs a catheter to start hemodialysis. EXAM: FLUOROSCOPIC AND ULTRASOUND GUIDED PLACEMENT OF A TUNNELED DIALYSIS CATHETER Physician: Stephan Minister. Anselm Pancoast, MD MEDICATIONS: Ancef 2 g; The antibiotic was administered within an appropriate time interval prior to skin puncture. ANESTHESIA/SEDATION: Versed 1.0 mg IV; Fentanyl 50 mcg IV; Moderate Sedation Time:  20 minutes The patient was continuously monitored during the procedure by the interventional radiology nurse under my direct supervision. FLUOROSCOPY TIME:  Fluoroscopy Time: 1 minutes and 18 seconds, 14 mGy COMPLICATIONS: None immediate. PROCEDURE: Informed consent was obtained for placement of a tunneled dialysis catheter. The patient was placed supine on the interventional table. Ultrasound confirmed a patent right internal jugular vein. Ultrasound images were obtained for documentation. The right side of the neck was prepped and draped in a sterile fashion. The right side of the neck was anesthetized with 1% lidocaine. Maximal barrier sterile technique was utilized including caps, mask, sterile gowns, sterile gloves, sterile drape, hand hygiene and skin antiseptic. A small incision was made with #11 blade scalpel. A 21 gauge needle directed into the right internal  jugular vein with ultrasound guidance. A micropuncture dilator set was placed. A 23 cm tip to cuff Palindrome catheter was selected. The skin below the right clavicle was anesthetized and a small incision was made with an #11 blade scalpel. A subcutaneous tunnel was formed to the vein dermatotomy site. The catheter was brought through the tunnel. The vein dermatotomy site was dilated to accommodate a peel-away sheath. The catheter was placed through the peel-away sheath and directed into the central venous structures. The tip of the catheter was placed at the superior cavoatrial junction with fluoroscopy. Fluoroscopic images were obtained for documentation. Both lumens were found to aspirate and flush well. The proper amount of heparin was flushed in both lumens. The vein dermatotomy site was closed using a single layer of absorbable suture and Dermabond. Gel-Foam was placed in the subcutaneous tract. The catheter was secured to the skin using Prolene suture. FINDINGS: Catheter tip at the superior cavoatrial junction. IMPRESSION: Successful placement of a right jugular tunneled dialysis catheter using ultrasound and fluoroscopic guidance. Electronically Signed   By: Markus Daft M.D.   On: 05/13/2018 17:23    I have reviewed the patient's current medications.  Assessment/Plan: 1] renal failure: He is status post hemodialysis for 2 hours yesterday.  Potassium is normal and patient is asymptomatic. 2] anemia: Is status post blood transfusion his hemoglobin is better. 3] hypertension: His blood pressure is reasonably controlled 4] bone and mineral disorder: His calcium is a range but phosphorus is high.  Patient at this moment is not on a binder. 5] diabetes: Denies any  polydipsia or polyuria.  His blood sugar is reasonable 7] fluid management: Patient has some edema but no difficulty breathing.  We are able to remove about a liter yesterday. 8] obesity Plan: 1] we will make arrangement for patient to get  dialysis today for 3 hours.  We will remove about 2 L today 2] we will use Epogen 6000 units IV after each dialysis 3] once an arrangement is made for out patient dialysis unit at [facility] we will send patient to be followed as an outpatient. 4] we will check his renal panel and CBC in the morning    LOS: 2 days   Lemoine Goyne S 05/14/2018,8:34 AM

## 2018-05-15 ENCOUNTER — Inpatient Hospital Stay (HOSPITAL_COMMUNITY): Payer: Commercial Managed Care - PPO

## 2018-05-15 LAB — RENAL FUNCTION PANEL
Albumin: 2.3 g/dL — ABNORMAL LOW (ref 3.5–5.0)
Anion gap: 9 (ref 5–15)
BUN: 60 mg/dL — ABNORMAL HIGH (ref 6–20)
CHLORIDE: 97 mmol/L — AB (ref 98–111)
CO2: 29 mmol/L (ref 22–32)
Calcium: 8.5 mg/dL — ABNORMAL LOW (ref 8.9–10.3)
Creatinine, Ser: 7.13 mg/dL — ABNORMAL HIGH (ref 0.61–1.24)
GFR calc Af Amer: 9 mL/min — ABNORMAL LOW (ref >60)
GFR, EST NON AFRICAN AMERICAN: 8 mL/min — AB (ref >60)
Glucose, Bld: 151 mg/dL — ABNORMAL HIGH (ref 70–99)
POTASSIUM: 4 mmol/L (ref 3.5–5.1)
Phosphorus: 5 mg/dL — ABNORMAL HIGH (ref 2.5–4.6)
Sodium: 135 mmol/L (ref 135–145)

## 2018-05-15 MED ORDER — DEXTROMETHORPHAN POLISTIREX ER 30 MG/5ML PO SUER: 30.0000 mg | Freq: Two times a day (BID) | ORAL | Status: DC | PRN

## 2018-05-15 MED ORDER — TORSEMIDE 20 MG PO TABS
60.0000 mg | ORAL_TABLET | Freq: Once | ORAL | Status: AC
  Administered 2018-05-15: 60 mg via ORAL
  Filled 2018-05-15: qty 3

## 2018-05-15 MED ORDER — DEXTROMETHORPHAN POLISTIREX ER 30 MG/5ML PO SUER
30.0000 mg | Freq: Two times a day (BID) | ORAL | Status: DC | PRN
  Administered 2018-05-15: 30 mg via ORAL
  Filled 2018-05-15: qty 5

## 2018-05-15 NOTE — Progress Notes (Signed)
PROGRESS NOTE   Wesley [name]  PJK:[id number]  DOB: [birth year]  DOA: 05/12/2018 PCP: Arsenio Katz, NP  Brief Admission Hx: 57 y.o. male with medical history significant of end-stage renal disease declining for several months has been refusing dialysis and putting it off, chronic anemia, diabetes comes in with progressive worsening shortness of breath.   MDM/Assessment & Plan:   1. End-stage renal disease-the patient has been seen by nephrology and sent to [facility] for temporary dialysis access placement.  Pt began hemodialysis 8/16 for 2 hours and he received hemodialysis for 3 hours 8/17.  He has been tolerating his treatments well.  Dr. Lowanda Foster is planning to do more hemodialysis tomorrow.  He can be discharged when outpatient hemodialysis treatments have been arranged.   2. Anemia of chronic disease-he is status post 2 units red blood cell transfusion and feeling much better.  Follow CBC. 3. NSTEMI-troponin is starting to trend down.  Cardiology is consulted felt related to demand ischemia from severe anemia and superimposed renal failure.  PT REFUSED ECHO TODAY.   4. Diabetes mellitus type 2-bs have been well controlled.  5. Essential hypertension-stable, following. 6. Chronic diastolic congestive heart failure-treated with volume removal during hemodialysis as ordered by the nephrology team. 7. Shortness of breath-RESOLVED.  Secondary to severe anemia and uremia and volume overload which is being treated and addressed.  His symptoms are improved after 2 units of packed red blood cell transfusion. 8. Left leg abrasion-continue topical wound care and it is healing.  DVT prophylaxis: SCDs Code Status: Full Family Communication: Wife at bedside Disposition Plan: home when outpatient hemodialysis can be arranged  Consultants:  Nephrology and cardiology  Procedures:  8/16 temporary dialysis access placement  8/16 hemodialysis initiation treatment  8/17 hemodialysis    Subjective: Patient has been coughing (nonproductive)  Objective: Vitals:   05/14/18 1520 05/14/18 1521 05/14/18 2125 05/15/18 0625  BP: 140/71 126/60 (!) 150/67   Pulse: 82 86 87   Resp:   16   Temp:   99 F (37.2 C)   TempSrc:   Oral   SpO2:   95%   Weight:    118.8 kg  Height:        Intake/Output Summary (Last 24 hours) at 05/15/2018 1123 Last data filed at 05/15/2018 1016 Gross per 24 hour  Intake 780 ml  Output 3277 ml  Net -2497 ml   Filed Weights   05/14/18 0500 05/14/18 1200 05/15/18 0625  Weight: 119.9 kg 120 kg 118.8 kg   REVIEW OF SYSTEMS  As per history otherwise all reviewed and reported negative  Exam:  General exam: Morbidly obese male chronically ill-appearing in no apparent distress cooperative. Respiratory system: CTA bilateral.  No increased work of breathing. Cardiovascular system: S1 & S2 heard.  Mild JVD. Gastrointestinal system: Abdomen is nondistended, soft and nontender. Normal bowel sounds heard. Central nervous system: Alert and oriented. No focal neurological deficits. Extremities: improving bilateral lower extremity edema.  Data Reviewed: Basic Metabolic Panel: Recent Labs  Lab 05/12/18 1306 05/13/18 0728 05/14/18 0633 05/15/18 0554  NA 137 137  138 138 135  K 4.4 4.5  4.5 4.2 4.0  CL 96* 96*  96* 95* 97*  CO2 27 26  26 27 29   GLUCOSE 173* 109*  112* 119* 151*  BUN 93* 102*  101* 89* 60*  CREATININE 11.71* 11.73*  11.79* 9.67* 7.13*  CALCIUM 8.2* 8.5*  8.5* 8.5* 8.5*  MG  --   --  2.4  --  PHOS  --  8.9* 7.5* 5.0*   Liver Function Tests: Recent Labs  Lab 05/12/18 1306 05/13/18 0728 05/14/18 0633 05/15/18 0554  AST 47*  --   --   --   ALT 37  --   --   --   ALKPHOS 79  --   --   --   BILITOT 0.9  --   --   --   PROT 6.6  --   --   --   ALBUMIN 2.5* 2.6* 2.5* 2.3*   Recent Labs  Lab 05/12/18 1306  LIPASE 24   No results for input(s): AMMONIA in the last 168 hours. CBC: Recent Labs  Lab  05/11/18 1429 05/11/18 1434 05/12/18 1306 05/13/18 0728 05/14/18 0633  WBC  --   --  15.6* 11.8* 9.1  NEUTROABS  --   --  14.0*  --   --   HGB 6.7* 7.1* 6.9* 7.8* 8.1*  HCT  --  23.4* 22.2* 24.9* 26.6*  MCV  --   --  91.0 90.9 92.0  PLT  --   --  184 187 221   Cardiac Enzymes: Recent Labs  Lab 05/12/18 1503 05/12/18 2134 05/13/18 0728  TROPONINI 8.12* 8.00* 6.60*   CBG (last 3)  Recent Labs    05/12/18 1632  GLUCAP 105*   Recent Results (from the past 240 hour(s))  MRSA PCR Screening     Status: None   Collection Time: 05/12/18  4:27 PM  Result Value Ref Range Status   MRSA by PCR NEGATIVE NEGATIVE Final    Comment:        The GeneXpert MRSA Assay (FDA approved for NASAL specimens only), is one component of a comprehensive MRSA colonization surveillance program. It is not intended to diagnose MRSA infection nor to guide or monitor treatment for MRSA infections. Performed at [facility], [address], [place], [place] [address]      Studies: Ir Cyndy Freeze Guide Cv Line Right  Result Date: 05/13/2018 INDICATION: 57 year old with acute on chronic renal failure. Patient needs a catheter to start hemodialysis. EXAM: FLUOROSCOPIC AND ULTRASOUND GUIDED PLACEMENT OF A TUNNELED DIALYSIS CATHETER Physician: Stephan Minister. Anselm Pancoast, MD MEDICATIONS: Ancef 2 g; The antibiotic was administered within an appropriate time interval prior to skin puncture. ANESTHESIA/SEDATION: Versed 1.0 mg IV; Fentanyl 50 mcg IV; Moderate Sedation Time:  20 minutes The patient was continuously monitored during the procedure by the interventional radiology nurse under my direct supervision. FLUOROSCOPY TIME:  Fluoroscopy Time: 1 minutes and 18 seconds, 14 mGy COMPLICATIONS: None immediate. PROCEDURE: Informed consent was obtained for placement of a tunneled dialysis catheter. The patient was placed supine on the interventional table. Ultrasound confirmed a patent right internal jugular vein. Ultrasound images were  obtained for documentation. The right side of the neck was prepped and draped in a sterile fashion. The right side of the neck was anesthetized with 1% lidocaine. Maximal barrier sterile technique was utilized including caps, mask, sterile gowns, sterile gloves, sterile drape, hand hygiene and skin antiseptic. A small incision was made with #11 blade scalpel. A 21 gauge needle directed into the right internal jugular vein with ultrasound guidance. A micropuncture dilator set was placed. A 23 cm tip to cuff Palindrome catheter was selected. The skin below the right clavicle was anesthetized and a small incision was made with an #11 blade scalpel. A subcutaneous tunnel was formed to the vein dermatotomy site. The catheter was brought through the tunnel. The vein dermatotomy site was  dilated to accommodate a peel-away sheath. The catheter was placed through the peel-away sheath and directed into the central venous structures. The tip of the catheter was placed at the superior cavoatrial junction with fluoroscopy. Fluoroscopic images were obtained for documentation. Both lumens were found to aspirate and flush well. The proper amount of heparin was flushed in both lumens. The vein dermatotomy site was closed using a single layer of absorbable suture and Dermabond. Gel-Foam was placed in the subcutaneous tract. The catheter was secured to the skin using Prolene suture. FINDINGS: Catheter tip at the superior cavoatrial junction. IMPRESSION: Successful placement of a right jugular tunneled dialysis catheter using ultrasound and fluoroscopic guidance. Electronically Signed   By: Markus Daft M.D.   On: 05/13/2018 17:23   Ir US Guide Vasc Access Right  Result Date: 05/13/2018 INDICATION: 57 year old with acute on chronic renal failure. Patient needs a catheter to start hemodialysis. EXAM: FLUOROSCOPIC AND ULTRASOUND GUIDED PLACEMENT OF A TUNNELED DIALYSIS CATHETER Physician: Stephan Minister. Anselm Pancoast, MD MEDICATIONS: Ancef 2 g; The  antibiotic was administered within an appropriate time interval prior to skin puncture. ANESTHESIA/SEDATION: Versed 1.0 mg IV; Fentanyl 50 mcg IV; Moderate Sedation Time:  20 minutes The patient was continuously monitored during the procedure by the interventional radiology nurse under my direct supervision. FLUOROSCOPY TIME:  Fluoroscopy Time: 1 minutes and 18 seconds, 14 mGy COMPLICATIONS: None immediate. PROCEDURE: Informed consent was obtained for placement of a tunneled dialysis catheter. The patient was placed supine on the interventional table. Ultrasound confirmed a patent right internal jugular vein. Ultrasound images were obtained for documentation. The right side of the neck was prepped and draped in a sterile fashion. The right side of the neck was anesthetized with 1% lidocaine. Maximal barrier sterile technique was utilized including caps, mask, sterile gowns, sterile gloves, sterile drape, hand hygiene and skin antiseptic. A small incision was made with #11 blade scalpel. A 21 gauge needle directed into the right internal jugular vein with ultrasound guidance. A micropuncture dilator set was placed. A 23 cm tip to cuff Palindrome catheter was selected. The skin below the right clavicle was anesthetized and a small incision was made with an #11 blade scalpel. A subcutaneous tunnel was formed to the vein dermatotomy site. The catheter was brought through the tunnel. The vein dermatotomy site was dilated to accommodate a peel-away sheath. The catheter was placed through the peel-away sheath and directed into the central venous structures. The tip of the catheter was placed at the superior cavoatrial junction with fluoroscopy. Fluoroscopic images were obtained for documentation. Both lumens were found to aspirate and flush well. The proper amount of heparin was flushed in both lumens. The vein dermatotomy site was closed using a single layer of absorbable suture and Dermabond. Gel-Foam was placed in the  subcutaneous tract. The catheter was secured to the skin using Prolene suture. FINDINGS: Catheter tip at the superior cavoatrial junction. IMPRESSION: Successful placement of a right jugular tunneled dialysis catheter using ultrasound and fluoroscopic guidance. Electronically Signed   By: Markus Daft M.D.   On: 05/13/2018 17:23   Scheduled Meds: . atorvastatin  40 mg Oral q1800  . calcitRIOL  0.5 mcg Oral Daily  . calcium acetate  1,334 mg Oral TID WC  . calcium acetate  667 mg Oral With snacks  . Chlorhexidine Gluconate Cloth  6 each Topical Q0600  . epoetin (EPOGEN/PROCRIT) injection  6,000 Units Subcutaneous Q T,Th,Sa-HD  . iron polysaccharides  150 mg Oral Daily  . senna-docusate  1 tablet Oral BID  . sodium bicarbonate  650 mg Oral TID  . sodium chloride flush  3 mL Intravenous Q12H  . torsemide  40 mg Oral Daily   Continuous Infusions: . sodium chloride    . sodium chloride    . sodium chloride    . sodium chloride    . sodium chloride      Principal Problem:   Normocytic anemia symptomatic Active Problems:   Diabetes mellitus (HCC)   HTN (hypertension)   Chronic diastolic CHF (congestive heart failure) (HCC)   CKD (chronic kidney disease), stage IV (HCC)   SOB (shortness of breath)   NSTEMI (non-ST elevated myocardial infarction) (HCC)   Symptomatic anemia  Time spent:   Irwin Brakeman, MD, FAAFP [facility] Pager [phone]  If 7PM-7AM, please contact night-coverage [email or web address] Password [email or web address] 05/15/2018, 11:23 AM    LOS: 3 days

## 2018-05-15 NOTE — Progress Notes (Signed)
Received verbal order from Dr. Lowanda Foster to give the patient another one time dose of Torsemide 60 mg tablet due to worsening fluid gain in legs.

## 2018-05-15 NOTE — Progress Notes (Signed)
Subjective: Interval History: Patient feeling good.  He does not have any nausea or vomiting.  Appetite remains good.  Objective: Vital signs in last 24 hours: Temp:  [98.2 F (36.8 C)-99 F (37.2 C)] 99 F (37.2 C) (08/17 2125) Pulse Rate:  [80-90] 87 (08/17 2125) Resp:  [16] 16 (08/17 2125) BP: (126-160)/(60-86) 150/67 (08/17 2125) SpO2:  [95 %] 95 % (08/17 2125) Weight:  [118.8 kg-120 kg] 118.8 kg (08/18 0625) Weight change: -2.3 kg  Intake/Output from previous day: 08/17 0701 - 08/18 0700 In: 780 [P.O.:780] Out: 3277 [Urine:1300] Intake/Output this shift: No intake/output data recorded.  Generally patient is alert and in no apparent distress Chest is clear to auscultation Heart exam revealed regular rate and rhythm no murmur no S3 Extremities no edema.  He has dressing on his left wound.  Lab Results: Recent Labs    05/13/18 0728 05/14/18 0633  WBC 11.8* 9.1  HGB 7.8* 8.1*  HCT 24.9* 26.6*  PLT 187 221   BMET:  Recent Labs    05/14/18 0633 05/15/18 0554  NA 138 135  K 4.2 4.0  CL 95* 97*  CO2 27 29  GLUCOSE 119* 151*  BUN 89* 60*  CREATININE 9.67* 7.13*  CALCIUM 8.5* 8.5*   No results for input(s): PTH in the last 72 hours. Iron Studies:  Recent Labs    05/14/18 0633  IRON 19*  TIBC 232*  FERRITIN 485*    Studies/Results: Ir Fluoro Guide Cv Line Right  Result Date: 05/13/2018 INDICATION: 57 year old with acute on chronic renal failure. Patient needs a catheter to start hemodialysis. EXAM: FLUOROSCOPIC AND ULTRASOUND GUIDED PLACEMENT OF A TUNNELED DIALYSIS CATHETER Physician: Stephan Minister. Anselm Pancoast, MD MEDICATIONS: Ancef 2 g; The antibiotic was administered within an appropriate time interval prior to skin puncture. ANESTHESIA/SEDATION: Versed 1.0 mg IV; Fentanyl 50 mcg IV; Moderate Sedation Time:  20 minutes The patient was continuously monitored during the procedure by the interventional radiology nurse under my direct supervision. FLUOROSCOPY TIME:   Fluoroscopy Time: 1 minutes and 18 seconds, 14 mGy COMPLICATIONS: None immediate. PROCEDURE: Informed consent was obtained for placement of a tunneled dialysis catheter. The patient was placed supine on the interventional table. Ultrasound confirmed a patent right internal jugular vein. Ultrasound images were obtained for documentation. The right side of the neck was prepped and draped in a sterile fashion. The right side of the neck was anesthetized with 1% lidocaine. Maximal barrier sterile technique was utilized including caps, mask, sterile gowns, sterile gloves, sterile drape, hand hygiene and skin antiseptic. A small incision was made with #11 blade scalpel. A 21 gauge needle directed into the right internal jugular vein with ultrasound guidance. A micropuncture dilator set was placed. A 23 cm tip to cuff Palindrome catheter was selected. The skin below the right clavicle was anesthetized and a small incision was made with an #11 blade scalpel. A subcutaneous tunnel was formed to the vein dermatotomy site. The catheter was brought through the tunnel. The vein dermatotomy site was dilated to accommodate a peel-away sheath. The catheter was placed through the peel-away sheath and directed into the central venous structures. The tip of the catheter was placed at the superior cavoatrial junction with fluoroscopy. Fluoroscopic images were obtained for documentation. Both lumens were found to aspirate and flush well. The proper amount of heparin was flushed in both lumens. The vein dermatotomy site was closed using a single layer of absorbable suture and Dermabond. Gel-Foam was placed in the subcutaneous tract. The catheter was  secured to the skin using Prolene suture. FINDINGS: Catheter tip at the superior cavoatrial junction. IMPRESSION: Successful placement of a right jugular tunneled dialysis catheter using ultrasound and fluoroscopic guidance. Electronically Signed   By: Markus Daft M.D.   On: 05/13/2018 17:23    Ir US Guide Vasc Access Right  Result Date: 05/13/2018 INDICATION: 57 year old with acute on chronic renal failure. Patient needs a catheter to start hemodialysis. EXAM: FLUOROSCOPIC AND ULTRASOUND GUIDED PLACEMENT OF A TUNNELED DIALYSIS CATHETER Physician: Stephan Minister. Anselm Pancoast, MD MEDICATIONS: Ancef 2 g; The antibiotic was administered within an appropriate time interval prior to skin puncture. ANESTHESIA/SEDATION: Versed 1.0 mg IV; Fentanyl 50 mcg IV; Moderate Sedation Time:  20 minutes The patient was continuously monitored during the procedure by the interventional radiology nurse under my direct supervision. FLUOROSCOPY TIME:  Fluoroscopy Time: 1 minutes and 18 seconds, 14 mGy COMPLICATIONS: None immediate. PROCEDURE: Informed consent was obtained for placement of a tunneled dialysis catheter. The patient was placed supine on the interventional table. Ultrasound confirmed a patent right internal jugular vein. Ultrasound images were obtained for documentation. The right side of the neck was prepped and draped in a sterile fashion. The right side of the neck was anesthetized with 1% lidocaine. Maximal barrier sterile technique was utilized including caps, mask, sterile gowns, sterile gloves, sterile drape, hand hygiene and skin antiseptic. A small incision was made with #11 blade scalpel. A 21 gauge needle directed into the right internal jugular vein with ultrasound guidance. A micropuncture dilator set was placed. A 23 cm tip to cuff Palindrome catheter was selected. The skin below the right clavicle was anesthetized and a small incision was made with an #11 blade scalpel. A subcutaneous tunnel was formed to the vein dermatotomy site. The catheter was brought through the tunnel. The vein dermatotomy site was dilated to accommodate a peel-away sheath. The catheter was placed through the peel-away sheath and directed into the central venous structures. The tip of the catheter was placed at the superior cavoatrial  junction with fluoroscopy. Fluoroscopic images were obtained for documentation. Both lumens were found to aspirate and flush well. The proper amount of heparin was flushed in both lumens. The vein dermatotomy site was closed using a single layer of absorbable suture and Dermabond. Gel-Foam was placed in the subcutaneous tract. The catheter was secured to the skin using Prolene suture. FINDINGS: Catheter tip at the superior cavoatrial junction. IMPRESSION: Successful placement of a right jugular tunneled dialysis catheter using ultrasound and fluoroscopic guidance. Electronically Signed   By: Markus Daft M.D.   On: 05/13/2018 17:23    I have reviewed the patient's current medications.  Assessment/Plan: 1] renal failure: He is status post hemodialysis for the last 2 days.  Patient does not have any uremic signs and symptoms. 2] anemia: Is status post blood transfusion his hemoglobin is stable.  His iron saturation is low but ferritin is high.  Possibly a combination of iron deficiency and anemia of chronic disease. 3] hypertension: His blood pressure is reasonably controlled 4] bone and mineral disorder: His calcium and phosphorus is a range.  Presently he is on a binder. 5] diabetes: Denies any polydipsia or polyuria.  His blood sugar is reasonable 7] fluid management: His edema has improved.  Patient is still with good urine output.  Presently he is a symptomatic. 8] obesity Plan: 1] we will make arrangement for patient to get dialysis for 4 hours tomorrow 2] we will continue with Epogen 3] patient can be discharged once  outpatient dialysis arrangement is made. 4] we will check his renal panel and CBC in the morning    LOS: 3 days   Teghan Philbin S 05/15/2018,7:53 AM

## 2018-05-15 NOTE — Progress Notes (Signed)
Patient politely declined echo, he and his wife felt that elevated troponin was due to kidney stress. Told him okay and would check with him in AM if changed his mind. Informed Lauren.

## 2018-05-16 DIAGNOSIS — D649 Anemia, unspecified: Secondary | ICD-10-CM

## 2018-05-16 DIAGNOSIS — Z992 Dependence on renal dialysis: Secondary | ICD-10-CM

## 2018-05-16 DIAGNOSIS — I248 Other forms of acute ischemic heart disease: Secondary | ICD-10-CM

## 2018-05-16 DIAGNOSIS — N185 Chronic kidney disease, stage 5: Secondary | ICD-10-CM

## 2018-05-16 DIAGNOSIS — E1122 Type 2 diabetes mellitus with diabetic chronic kidney disease: Secondary | ICD-10-CM

## 2018-05-16 DIAGNOSIS — I5032 Chronic diastolic (congestive) heart failure: Secondary | ICD-10-CM

## 2018-05-16 DIAGNOSIS — I5033 Acute on chronic diastolic (congestive) heart failure: Secondary | ICD-10-CM

## 2018-05-16 DIAGNOSIS — I214 Non-ST elevation (NSTEMI) myocardial infarction: Secondary | ICD-10-CM

## 2018-05-16 LAB — RENAL FUNCTION PANEL
ALBUMIN: 2.3 g/dL — AB (ref 3.5–5.0)
ANION GAP: 13 (ref 5–15)
BUN: 74 mg/dL — ABNORMAL HIGH (ref 6–20)
CALCIUM: 9.4 mg/dL (ref 8.9–10.3)
CO2: 29 mmol/L (ref 22–32)
Chloride: 95 mmol/L — ABNORMAL LOW (ref 98–111)
Creatinine, Ser: 7.82 mg/dL — ABNORMAL HIGH (ref 0.61–1.24)
GFR, EST AFRICAN AMERICAN: 8 mL/min — AB (ref >60)
GFR, EST NON AFRICAN AMERICAN: 7 mL/min — AB (ref >60)
Glucose, Bld: 165 mg/dL — ABNORMAL HIGH (ref 70–99)
PHOSPHORUS: 5.9 mg/dL — AB (ref 2.5–4.6)
Potassium: 4.4 mmol/L (ref 3.5–5.1)
Sodium: 137 mmol/L (ref 135–145)

## 2018-05-16 LAB — HEPATITIS B CORE ANTIBODY, TOTAL: Hep B Core Total Ab: NEGATIVE

## 2018-05-16 LAB — HEPATITIS B SURFACE ANTIBODY, QUANTITATIVE: Hepatitis B-Post: <3.1 m[IU]/mL — ABNORMAL LOW (ref >9.9)

## 2018-05-16 LAB — CBC
HCT: 26.5 % — ABNORMAL LOW (ref 39.0–52.0)
HEMOGLOBIN: 8 g/dL — AB (ref 13.0–17.0)
MCH: 28 pg (ref 26.0–34.0)
MCHC: 30.2 g/dL (ref 30.0–36.0)
MCV: 92.7 fL (ref 78.0–100.0)
Platelets: 202 10*3/uL (ref 150–400)
RBC: 2.86 MIL/uL — AB (ref 4.22–5.81)
RDW: 15.1 % (ref 11.5–15.5)
WBC: 12.2 10*3/uL — AB (ref 4.0–10.5)

## 2018-05-16 MED ORDER — LIDOCAINE HCL (PF) 1 % IJ SOLN: 5.0000 mL | INTRAMUSCULAR | Status: DC | PRN

## 2018-05-16 MED ORDER — TORSEMIDE 20 MG PO TABS: 60.0000 mg | ORAL_TABLET | Freq: Every day | ORAL | 0 refills | Status: AC

## 2018-05-16 MED ORDER — HEPARIN SODIUM (PORCINE) 1000 UNIT/ML DIALYSIS
1000.0000 [IU] | INTRAMUSCULAR | Status: DC | PRN
  Administered 2018-05-16: 3400 [IU] via INTRAVENOUS_CENTRAL
  Filled 2018-05-16 (×2): qty 1

## 2018-05-16 MED ORDER — LIDOCAINE-PRILOCAINE 2.5-2.5 % EX CREA: 1.0000 "application " | TOPICAL_CREAM | CUTANEOUS | Status: DC | PRN

## 2018-05-16 MED ORDER — ALTEPLASE 2 MG IJ SOLR
2.0000 mg | Freq: Once | INTRAMUSCULAR | Status: DC | PRN
  Filled 2018-05-16: qty 2

## 2018-05-16 MED ORDER — METOPROLOL TARTRATE 25 MG PO TABS: 25.0000 mg | ORAL_TABLET | Freq: Two times a day (BID) | ORAL | Status: DC

## 2018-05-16 MED ORDER — EPOETIN ALFA 10000 UNIT/ML IJ SOLN
INTRAMUSCULAR | Status: AC
  Administered 2018-05-16: 6000 [IU] via INTRAVENOUS
  Filled 2018-05-16: qty 1

## 2018-05-16 MED ORDER — METOPROLOL TARTRATE 25 MG PO TABS: 25.0000 mg | ORAL_TABLET | Freq: Two times a day (BID) | ORAL | 0 refills | Status: DC

## 2018-05-16 MED ORDER — HEPARIN SODIUM (PORCINE) 1000 UNIT/ML IJ SOLN
INTRAMUSCULAR | Status: AC
  Administered 2018-05-16: 3400 [IU] via INTRAVENOUS_CENTRAL
  Filled 2018-05-16: qty 1

## 2018-05-16 MED ORDER — HEPARIN SODIUM (PORCINE) 1000 UNIT/ML DIALYSIS
20.0000 [IU]/kg | INTRAMUSCULAR | Status: DC | PRN
  Administered 2018-05-16: 2400 [IU] via INTRAVENOUS_CENTRAL
  Filled 2018-05-16 (×2): qty 3

## 2018-05-16 MED ORDER — SODIUM CHLORIDE 0.9 % IV SOLN
100.0000 mL | INTRAVENOUS | Status: DC | PRN
Start: 2018-05-16 — End: 2018-05-16

## 2018-05-16 MED ORDER — TORSEMIDE 20 MG PO TABS
60.0000 mg | ORAL_TABLET | Freq: Every day | ORAL | Status: DC
  Administered 2018-05-16: 60 mg via ORAL
  Filled 2018-05-16: qty 3

## 2018-05-16 MED ORDER — SODIUM CHLORIDE 0.9 % IV SOLN: 100.0000 mL | INTRAVENOUS | Status: DC | PRN

## 2018-05-16 MED ORDER — EPOETIN ALFA 10000 UNIT/ML IJ SOLN
6000.0000 [IU] | INTRAMUSCULAR | Status: DC
  Administered 2018-05-16: 6000 [IU] via INTRAVENOUS
  Filled 2018-05-16 (×2): qty 1

## 2018-05-16 MED ORDER — PENTAFLUOROPROP-TETRAFLUOROETH EX AERO: 1.0000 "application " | INHALATION_SPRAY | CUTANEOUS | Status: DC | PRN

## 2018-05-16 MED ORDER — ATORVASTATIN CALCIUM 40 MG PO TABS: 40.0000 mg | ORAL_TABLET | Freq: Every day | ORAL | 1 refills | Status: DC

## 2018-05-16 NOTE — Discharge Instructions (Signed)
SEE PRIMARY DOCTOR IN ONE WEEK HAVE BMP AND CBC LABS

## 2018-05-16 NOTE — Procedures (Signed)
     HEMODIALYSIS TREATMENT NOTE (New start, HD#3):   4 hour low-heparin dialysis completed via right IJ tunneled catheter. Exit site unremarkable. Goal met: 3 liters removed without interruption in ultrafiltration.  All blood was returned.  Hemodynamically stable throughout HD session.   Rockwell Alexandria, RN, CDN

## 2018-05-16 NOTE — Progress Notes (Signed)
Subjective: Interval History: Patient states that he is feeling better.  His leg swelling is improving and no difficulty breathing.  Objective: Vital signs in last 24 hours: Temp:  [98.2 F (36.8 C)-98.6 F (37 C)] 98.2 F (36.8 C) (08/19 0533) Pulse Rate:  [87-90] 88 (08/19 0533) Resp:  [16] 16 (08/19 0533) BP: (154-158)/(64-72) 154/71 (08/19 0533) SpO2:  [91 %-97 %] 93 % (08/19 0533) Weight:  [118.5 kg] 118.5 kg (08/19 0533) Weight change: -1.475 kg  Intake/Output from previous day: 08/18 0701 - 08/19 0700 In: 663 [P.O.:660; I.V.:3] Out: 2875 [Urine:2875] Intake/Output this shift: No intake/output data recorded.  Generally patient is alert and in no apparent distress Chest is clear to auscultation Heart exam revealed regular rate and rhythm no murmur no S3 Extremities: Trace to 1+ edema.  He has dressing on his left wound.  Lab Results: Recent Labs    05/14/18 0633  WBC 9.1  HGB 8.1*  HCT 26.6*  PLT 221   BMET:  Recent Labs    05/14/18 0633 05/15/18 0554  NA 138 135  K 4.2 4.0  CL 95* 97*  CO2 27 29  GLUCOSE 119* 151*  BUN 89* 60*  CREATININE 9.67* 7.13*  CALCIUM 8.5* 8.5*   No results for input(s): PTH in the last 72 hours. Iron Studies:  Recent Labs    05/14/18 0633  IRON 19*  TIBC 232*  FERRITIN 485*    Studies/Results: No results found.  I have reviewed the patient's current medications.  Assessment/Plan: 1] renal failure: He is status post hemodialysis on Saturday.  Patient at this moment does not have any nausea or vomiting. 2] anemia: Is status post blood transfusion his hemoglobin is stable.  On Neupogen during dialysis. 3] hypertension: His blood pressure is well controlled 4] bone and mineral disorder: His calcium and phosphorus is higher than our target goal..  Presently he is on a binder. 5] diabetes: Denies any polydipsia or polyuria.  His blood sugar is better controlled. 7] fluid management: His edema has improved.  Patient on  Demadex 60 mg p.o. once a day.  He had 2800 cc of urine output.  Denies any difficulty breathing.  His leg swelling seems to be getting better. 8] obesity Plan: 1] patient advised to cut down his salt and fluid intake  2] we will make arrangement for patient to get dialysis for 4 hours today.  We will try to remove about 3 L if systolic blood pressure remains stable. 3] patient can be discharged once outpatient dialysis arrangement is made. 4] we will check his renal panel and CBC in the morning    LOS: 4 days   Francois Elk S 05/16/2018,7:38 AM

## 2018-05-16 NOTE — Clinical Social Work Note (Addendum)
Chair time received from [facility] as follows   For pt. Wesley [name] we have a chair time for him at the [place] location. TTS 11:30am with 10:30am start on Thursday(8/22). They advised that the doctor said the pt. was getting treatment today and could wait till Thursday to get next treatment.    LCSW provided this information to patient.   LCSW signing off.    Ambrose Pancoast D

## 2018-05-16 NOTE — Progress Notes (Signed)
Progress Note  Patient Name: Wesley [name] Date of Encounter: 05/16/2018  Primary Cardiologist: New - Dr. Bronson Ing  Subjective   He denies chest pain and has markedly decreased shortness of breath after the institution of dialysis.  He dialyzed 2 L yesterday and the plan is to dialyze between 3 and 3-1/2 L today.  He does have bilateral leg swelling and continues to take torsemide as he makes urine.  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  Inpatient Medications    Scheduled Meds: . atorvastatin  40 mg Oral q1800  . calcitRIOL  0.5 mcg Oral Daily  . calcium acetate  1,334 mg Oral TID WC  . calcium acetate  667 mg Oral With snacks  . Chlorhexidine Gluconate Cloth  6 each Topical Q0600  . epoetin alfa      . epoetin (EPOGEN/PROCRIT) injection  6,000 Units Intravenous Q M,W,F-HD  . iron polysaccharides  150 mg Oral Daily  . senna-docusate  1 tablet Oral BID  . sodium bicarbonate  650 mg Oral TID  . sodium chloride flush  3 mL Intravenous Q12H  . torsemide  60 mg Oral Daily   Continuous Infusions: . sodium chloride    . sodium chloride    . sodium chloride    . sodium chloride    . sodium chloride    . sodium chloride    . sodium chloride     PRN Meds: sodium chloride, sodium chloride, sodium chloride, sodium chloride, sodium chloride, sodium chloride, sodium chloride, acetaminophen **OR** acetaminophen, alteplase, alteplase, dextromethorphan, heparin, heparin, lidocaine (PF), lidocaine-prilocaine, pentafluoroprop-tetrafluoroeth, sodium chloride flush   Vital Signs    Vitals:   05/15/18 1609 05/15/18 2117 05/16/18 0533 05/16/18 1000  BP: (!) 156/72 (!) 158/64 (!) 154/71 (!) 145/75  Pulse: 87 90 88 82  Resp: 16 16 16 16   Temp:  98.6 F (37 C) 98.2 F (36.8 C) 98.3 F (36.8 C)  TempSrc:  Oral Oral Oral  SpO2: 97% 91% 93% 97%  Weight:   118.5 kg 118.5 kg  Height:        Intake/Output Summary (Last 24 hours) at  05/16/2018 1014 Last data filed at 05/16/2018 0900 Gross per 24 hour  Intake 903 ml  Output 2925 ml  Net -2022 ml   Filed Weights   05/15/18 0625 05/16/18 0533 05/16/18 1000  Weight: 118.8 kg 118.5 kg 118.5 kg    Telemetry    Not on telemetry  ECG    No new tracings- Personally Reviewed  Physical Exam   GEN: No acute distress.   Neck: No JVD Cardiac: RRR, no murmurs, rubs, or gallops.  Respiratory: Clear to auscultation bilaterally. GI: Soft, nontender, non-distended  MS:  1+ pitting bilateral lower extremity edema; No deformity. Neuro:  Nonfocal  Psych: Normal affect   Labs    Chemistry Recent Labs  Lab 05/12/18 1306  05/14/18 0633 05/15/18 0554 05/16/18 0433  NA 137   < > 138 135 137  K 4.4   < > 4.2 4.0 4.4  CL 96*   < > 95* 97* 95*  CO2 27   < > 27 29 29   GLUCOSE 173*   < > 119* 151* 165*  BUN 93*   < > 89* 60* 74*  CREATININE 11.71*   < > 9.67* 7.13* 7.82*  CALCIUM 8.2*   < > 8.5* 8.5* 9.4  PROT 6.6  --   --   --   --  ALBUMIN 2.5*   < > 2.5* 2.3* 2.3*  AST 47*  --   --   --   --   ALT 37  --   --   --   --   ALKPHOS 79  --   --   --   --   BILITOT 0.9  --   --   --   --   GFRNONAA 4*   < > 5* 8* 7*  GFRAA 5*   < > 6* 9* 8*  ANIONGAP 14   < > 16* 9 13   < > = values in this interval not displayed.     Hematology Recent Labs  Lab 05/13/18 0728 05/14/18 0633 05/16/18 0433  WBC 11.8* 9.1 12.2*  RBC 2.74* 2.89* 2.86*  HGB 7.8* 8.1* 8.0*  HCT 24.9* 26.6* 26.5*  MCV 90.9 92.0 92.7  MCH 28.5 28.0 28.0  MCHC 31.3 30.5 30.2  RDW 15.2 15.3 15.1  PLT 187 221 202    Cardiac Enzymes Recent Labs  Lab 05/12/18 1503 05/12/18 2134 05/13/18 0728  TROPONINI 8.12* 8.00* 6.60*    Recent Labs  Lab 05/12/18 1432  TROPIPOC 6.17*     BNPNo results for input(s): BNP, PROBNP in the last 168 hours.   DDimer No results for input(s): DDIMER in the last 168 hours.   Radiology    No results found.  Cardiac Studies   The patient refused an  echocardiogram.  Patient Profile     57 y.o. male with past medical history of HTN and Stage 5 CKD who is being seen today for the evaluation of an NSTEMI at the request of Dr. Wynetta Emery.   Assessment & Plan    1. NSTEMI: I suspect troponin elevation and ECG abnormalities are secondary to demand ischemia in the context of severe anemia and renal failure.  He remains symptomatically stable and shortness of breath has improved with dialysis.  He refused an echocardiogram.  He would prefer to hold off on additional testing at this time.  He is not a candidate for antiplatelet therapy given his anemia. I am unable to add ACE inhibitors, angiotensin receptor blockers, or angiotensin receptor-neprilysin inhibitors due to advanced chronic kidney disease. I started atorvastatin 40 mg.  Blood pressure is coming down with dialysis.  I will add metoprolol 25 mg twice daily.  An outpatient stress test can be considered.  He has agreed to follow-up with me in the [place] office.  2.  Hypertension: He is mildly hypertensive and blood pressure improves with dialysis.  I am starting low-dose metoprolol 25 mg twice daily for reasons mentioned above.  3.  Severe anemia: He has received packed red blood cell transfusions with symptomatic improvement.  Hemoglobin is 8 today.  4.  End-stage renal disease: He is undergoing dialysis with a plan to remove between 3 and 3-1/2 L today.  5.  Acute on chronic diastolic heart failure: He refused an echocardiogram.  He still makes urine and is taking torsemide.    [facility] will sign off.   Medication Recommendations:  Continue Lipitor and metoprolol at time of discharge. Other recommendations (labs, testing, etc): If he is agreeable in the outpatient setting, I would consider an outpatient stress test and/or echocardiogram. Follow up as an outpatient: With me in our [place] office.  I will arrange for an appointment.  For questions or updates, please contact  [place] Please consult [email or web address] for contact info under Cardiology/STEMI.      Signed,  Kate Sable, MD  05/16/2018, 10:14 AM

## 2018-05-16 NOTE — Progress Notes (Signed)
IV removed and discharge instructions reviewed with patient and wife.

## 2018-05-16 NOTE — Discharge Summary (Signed)
Physician Discharge Summary  Wesley [name] ZOX:[id number] DOB: May 04, 1961 DOA: 05/12/2018  PCP: Arsenio Katz, NP  Admit date: 05/12/2018 Discharge date: 05/16/2018  Admitted From: Home Disposition: Home  Recommendations for Outpatient Follow-up:  1. Follow up with PCP in 1-2 weeks 2. Please obtain BMP/CBC in one week 3. Patient's next dialysis treatment as an outpatient will be on 8/22 4. Outpatient follow-up with cardiology will be arranged. 5. Consider repeat chest x-ray in the next 2 to 3 weeks if cough persists  Discharge Condition: Stable CODE STATUS: Full code Diet recommendation: Heart healthy, carb modified  Brief/Interim Summary: 57 year old male with a history of end-stage renal disease, chronic anemia, diabetes, was admitted to the hospital with worsening shortness of breath.  He was found to have progressive renal disease and evidence of non-ST elevation MI.  He was admitted for further treatments.  Discharge Diagnoses:  Principal Problem:   Normocytic anemia symptomatic Active Problems:   Diabetes mellitus (HCC)   HTN (hypertension)   Chronic diastolic CHF (congestive heart failure) (HCC)   CKD (chronic kidney disease), stage IV (HCC)   SOB (shortness of breath)   NSTEMI (non-ST elevated myocardial infarction) (HCC)   Symptomatic anemia  1. End-stage renal disease on hemodialysis.  Seen by nephrology.  Patient had tunneled catheter placed by interventional radiology.  He began hemodialysis on 8/16 and has had 3 treatments here in the hospital.  Overall volume status is better.  Shortness of breath is also improving.  He has been set up with outpatient dialysis, his next treatment will be on 8/22. 2. Anemia of chronic disease.  Patient received 2 units PRBC during his hospitalization.  He does not have any evidence of bleeding.  This is likely related to his underlying renal disease.  Hemoglobin has been better.  He will be followed up by nephrology. 3. Non-ST  elevation MI.  Patient had a peak troponin of 8.12.  Seen by cardiology who felt that his elevated troponin is likely related to demand ischemia in the setting of severe anemia and renal failure.  He was started on beta-blockers, statin.  Not felt to be a candidate for antiplatelet therapy due to his anemia.  Echocardiogram was ordered, patient did not wish to have this done in the hospital.  Plans are to follow-up with cardiology as an outpatient and consider outpatient echo versus stress testing. 4. Acute on chronic diastolic congestive heart failure.  Refused echocardiogram.  Overall volume status improved with diuretics as well as dialysis.  Shortness of breath is better.  Discharge Instructions  Discharge Instructions    Diet - low sodium heart healthy   Complete by:  As directed    Increase activity slowly   Complete by:  As directed      Allergies as of 05/16/2018   No Known Allergies     Medication List    STOP taking these medications   sodium bicarbonate 650 MG tablet     TAKE these medications   atorvastatin 40 MG tablet Commonly known as:  LIPITOR Take 1 tablet (40 mg total) by mouth daily at 6 PM.   calcitRIOL 0.5 MCG capsule Commonly known as:  ROCALTROL Take 1 capsule (0.5 mcg total) by mouth daily.   calcium acetate 667 MG capsule Commonly known as:  PHOSLO Take 2 capsules (1,334 mg total) by mouth 3 (three) times daily with meals.   calcium acetate 667 MG capsule Commonly known as:  PHOSLO Take 1 capsule (667 mg total) by mouth with snacks.  DUREZOL 0.05 % Emul Generic drug:  Difluprednate 1 DROP IN LEFT EYE TWICE A DAY START AFTER SURGERY AND CONTINUE UNTIL DOCTOR INSTRUCTS DIFFERENTLY   FERREX 150 150 MG capsule Generic drug:  iron polysaccharides Take 150 mg by mouth daily.   FISH OIL PO Take 1 tablet by mouth daily.   metoprolol tartrate 25 MG tablet Commonly known as:  LOPRESSOR Take 1 tablet (25 mg total) by mouth 2 (two) times daily.    PROLENSA 0.07 % Soln Generic drug:  Bromfenac Sodium INSTILL 1 DROP INTO LEFT EYE DAILY, START 5 DAYS PRIOR TO SURGERY AND CONTINUE UNTIL GONE   torsemide 20 MG tablet Commonly known as:  DEMADEX Take 3 tablets (60 mg total) by mouth daily. What changed:  how much to take      Follow-up Information    [name], PA-C On 06/02/2018.   Specialties:  Cardiology, Radiology Why:  at 1:30 pm Contact information: [place] [address] [phone]          No Known Allergies  Consultations:  Nephrology  Cardiology   Procedures/Studies: Dg Chest 2 View  Result Date: 05/12/2018 CLINICAL DATA:  Shortness of breath EXAM: CHEST - 2 VIEW COMPARISON:  December 17, 2017 FINDINGS: There is no appreciable edema or consolidation. There is cardiomegaly with pulmonary venous hypertension. No adenopathy. There is aortic atherosclerosis. No bone lesions. IMPRESSION: Persistent pulmonary vascular congestion without frank edema or consolidation. There is aortic atherosclerosis. Aortic Atherosclerosis (ICD10-I70.0). Electronically Signed   By: Lowella Grip III M.D.   On: 05/12/2018 13:19   Ir Fluoro Guide Cv Line Right  Result Date: 05/13/2018 INDICATION: 57 year old with acute on chronic renal failure. Patient needs a catheter to start hemodialysis. EXAM: FLUOROSCOPIC AND ULTRASOUND GUIDED PLACEMENT OF A TUNNELED DIALYSIS CATHETER Physician: Stephan Minister. Anselm Pancoast, MD MEDICATIONS: Ancef 2 g; The antibiotic was administered within an appropriate time interval prior to skin puncture. ANESTHESIA/SEDATION: Versed 1.0 mg IV; Fentanyl 50 mcg IV; Moderate Sedation Time:  20 minutes The patient was continuously monitored during the procedure by the interventional radiology nurse under my direct supervision. FLUOROSCOPY TIME:  Fluoroscopy Time: 1 minutes and 18 seconds, 14 mGy COMPLICATIONS: None immediate. PROCEDURE: Informed consent was obtained for placement of a tunneled dialysis catheter. The  patient was placed supine on the interventional table. Ultrasound confirmed a patent right internal jugular vein. Ultrasound images were obtained for documentation. The right side of the neck was prepped and draped in a sterile fashion. The right side of the neck was anesthetized with 1% lidocaine. Maximal barrier sterile technique was utilized including caps, mask, sterile gowns, sterile gloves, sterile drape, hand hygiene and skin antiseptic. A small incision was made with #11 blade scalpel. A 21 gauge needle directed into the right internal jugular vein with ultrasound guidance. A micropuncture dilator set was placed. A 23 cm tip to cuff Palindrome catheter was selected. The skin below the right clavicle was anesthetized and a small incision was made with an #11 blade scalpel. A subcutaneous tunnel was formed to the vein dermatotomy site. The catheter was brought through the tunnel. The vein dermatotomy site was dilated to accommodate a peel-away sheath. The catheter was placed through the peel-away sheath and directed into the central venous structures. The tip of the catheter was placed at the superior cavoatrial junction with fluoroscopy. Fluoroscopic images were obtained for documentation. Both lumens were found to aspirate and flush well. The proper amount of heparin was flushed in both lumens.  The vein dermatotomy site was closed using a single layer of absorbable suture and Dermabond. Gel-Foam was placed in the subcutaneous tract. The catheter was secured to the skin using Prolene suture. FINDINGS: Catheter tip at the superior cavoatrial junction. IMPRESSION: Successful placement of a right jugular tunneled dialysis catheter using ultrasound and fluoroscopic guidance. Electronically Signed   By: Markus Daft M.D.   On: 05/13/2018 17:23   Ir US Guide Vasc Access Right  Result Date: 05/13/2018 INDICATION: 57 year old with acute on chronic renal failure. Patient needs a catheter to start hemodialysis. EXAM:  FLUOROSCOPIC AND ULTRASOUND GUIDED PLACEMENT OF A TUNNELED DIALYSIS CATHETER Physician: Stephan Minister. Anselm Pancoast, MD MEDICATIONS: Ancef 2 g; The antibiotic was administered within an appropriate time interval prior to skin puncture. ANESTHESIA/SEDATION: Versed 1.0 mg IV; Fentanyl 50 mcg IV; Moderate Sedation Time:  20 minutes The patient was continuously monitored during the procedure by the interventional radiology nurse under my direct supervision. FLUOROSCOPY TIME:  Fluoroscopy Time: 1 minutes and 18 seconds, 14 mGy COMPLICATIONS: None immediate. PROCEDURE: Informed consent was obtained for placement of a tunneled dialysis catheter. The patient was placed supine on the interventional table. Ultrasound confirmed a patent right internal jugular vein. Ultrasound images were obtained for documentation. The right side of the neck was prepped and draped in a sterile fashion. The right side of the neck was anesthetized with 1% lidocaine. Maximal barrier sterile technique was utilized including caps, mask, sterile gowns, sterile gloves, sterile drape, hand hygiene and skin antiseptic. A small incision was made with #11 blade scalpel. A 21 gauge needle directed into the right internal jugular vein with ultrasound guidance. A micropuncture dilator set was placed. A 23 cm tip to cuff Palindrome catheter was selected. The skin below the right clavicle was anesthetized and a small incision was made with an #11 blade scalpel. A subcutaneous tunnel was formed to the vein dermatotomy site. The catheter was brought through the tunnel. The vein dermatotomy site was dilated to accommodate a peel-away sheath. The catheter was placed through the peel-away sheath and directed into the central venous structures. The tip of the catheter was placed at the superior cavoatrial junction with fluoroscopy. Fluoroscopic images were obtained for documentation. Both lumens were found to aspirate and flush well. The proper amount of heparin was flushed in  both lumens. The vein dermatotomy site was closed using a single layer of absorbable suture and Dermabond. Gel-Foam was placed in the subcutaneous tract. The catheter was secured to the skin using Prolene suture. FINDINGS: Catheter tip at the superior cavoatrial junction. IMPRESSION: Successful placement of a right jugular tunneled dialysis catheter using ultrasound and fluoroscopic guidance. Electronically Signed   By: Markus Daft M.D.   On: 05/13/2018 17:23       Subjective: He feels that shortness of breath and cough has improved with dialysis treatments.  Feels that lower extremity edema is also improving.  Discharge Exam: Vitals:   05/16/18 1345 05/16/18 1415  BP: 134/72 (!) 147/74  Pulse: 80 81  Resp:    Temp:    SpO2:     Vitals:   05/16/18 1245 05/16/18 1315 05/16/18 1345 05/16/18 1415  BP: (!) 152/80 124/70 134/72 (!) 147/74  Pulse: 88 86 80 81  Resp:      Temp:      TempSrc:      SpO2:      Weight:      Height:        General: Pt is alert, awake, not in  acute distress Cardiovascular: RRR, S1/S2 +, no rubs, no gallops Respiratory: CTA bilaterally, no wheezing, no rhonchi Abdominal: Soft, NT, ND, bowel sounds + Extremities: 1+ edema, no cyanosis    The results of significant diagnostics from this hospitalization (including imaging, microbiology, ancillary and laboratory) are listed below for reference.     Microbiology: Recent Results (from the past 240 hour(s))  MRSA PCR Screening     Status: None   Collection Time: 05/12/18  4:27 PM  Result Value Ref Range Status   MRSA by PCR NEGATIVE NEGATIVE Final    Comment:        The GeneXpert MRSA Assay (FDA approved for NASAL specimens only), is one component of a comprehensive MRSA colonization surveillance program. It is not intended to diagnose MRSA infection nor to guide or monitor treatment for MRSA infections. Performed at [facility], [address] [place], [place] [address]      Labs: BNP (last  3 results) No results for input(s): BNP in the last 8760 hours. Basic Metabolic Panel: Recent Labs  Lab 05/12/18 1306 05/13/18 0728 05/14/18 0633 05/15/18 0554 05/16/18 0433  NA 137 137  138 138 135 137  K 4.4 4.5  4.5 4.2 4.0 4.4  CL 96* 96*  96* 95* 97* 95*  CO2 27 26  26 27 29 29   GLUCOSE 173* 109*  112* 119* 151* 165*  BUN 93* 102*  101* 89* 60* 74*  CREATININE 11.71* 11.73*  11.79* 9.67* 7.13* 7.82*  CALCIUM 8.2* 8.5*  8.5* 8.5* 8.5* 9.4  MG  --   --  2.4  --   --   PHOS  --  8.9* 7.5* 5.0* 5.9*   Liver Function Tests: Recent Labs  Lab 05/12/18 1306 05/13/18 0728 05/14/18 0633 05/15/18 0554 05/16/18 0433  AST 47*  --   --   --   --   ALT 37  --   --   --   --   ALKPHOS 79  --   --   --   --   BILITOT 0.9  --   --   --   --   PROT 6.6  --   --   --   --   ALBUMIN 2.5* 2.6* 2.5* 2.3* 2.3*   Recent Labs  Lab 05/12/18 1306  LIPASE 24   No results for input(s): AMMONIA in the last 168 hours. CBC: Recent Labs  Lab 05/11/18 1434 05/12/18 1306 05/13/18 0728 05/14/18 0633 05/16/18 0433  WBC  --  15.6* 11.8* 9.1 12.2*  NEUTROABS  --  14.0*  --   --   --   HGB 7.1* 6.9* 7.8* 8.1* 8.0*  HCT 23.4* 22.2* 24.9* 26.6* 26.5*  MCV  --  91.0 90.9 92.0 92.7  PLT  --  184 187 221 202   Cardiac Enzymes: Recent Labs  Lab 05/12/18 1503 05/12/18 2134 05/13/18 0728  TROPONINI 8.12* 8.00* 6.60*   BNP: Invalid input(s): POCBNP CBG: Recent Labs  Lab 05/12/18 1632  GLUCAP 105*   D-Dimer No results for input(s): DDIMER in the last 72 hours. Hgb A1c No results for input(s): HGBA1C in the last 72 hours. Lipid Profile No results for input(s): CHOL, HDL, LDLCALC, TRIG, CHOLHDL, LDLDIRECT in the last 72 hours. Thyroid function studies No results for input(s): TSH, T4TOTAL, T3FREE, THYROIDAB in the last 72 hours.  Invalid input(s): FREET3 Anemia work up Recent Labs    05/14/18 0633  FERRITIN 485*  TIBC 232*  IRON 19*  Urinalysis    Component Value  Date/Time   COLORURINE YELLOW 05/12/2018 1242   APPEARANCEUR CLEAR 05/12/2018 1242   LABSPEC 1.014 05/12/2018 1242   PHURINE 7.0 05/12/2018 1242   GLUCOSEU 150 (A) 05/12/2018 1242   HGBUR SMALL (A) 05/12/2018 1242   BILIRUBINUR NEGATIVE 05/12/2018 1242   KETONESUR NEGATIVE 05/12/2018 1242   PROTEINUR >=300 (A) 05/12/2018 1242   UROBILINOGEN 0.2 09/12/2014 1944   NITRITE NEGATIVE 05/12/2018 1242   LEUKOCYTESUR NEGATIVE 05/12/2018 1242   Sepsis Labs Invalid input(s): PROCALCITONIN,  WBC,  LACTICIDVEN Microbiology Recent Results (from the past 240 hour(s))  MRSA PCR Screening     Status: None   Collection Time: 05/12/18  4:27 PM  Result Value Ref Range Status   MRSA by PCR NEGATIVE NEGATIVE Final    Comment:        The GeneXpert MRSA Assay (FDA approved for NASAL specimens only), is one component of a comprehensive MRSA colonization surveillance program. It is not intended to diagnose MRSA infection nor to guide or monitor treatment for MRSA infections. Performed at [facility], [address] [place], [place] [address]      Time coordinating discharge: 54mins  SIGNED:   Kathie Dike, MD  [facility] 05/16/2018, 2:52 PM Pager   If 7PM-7AM, please contact night-coverage [email or web address] Password [email or web address]

## 2018-05-17 ENCOUNTER — Other Ambulatory Visit (HOSPITAL_COMMUNITY): Payer: Commercial Managed Care - PPO

## 2018-05-17 ENCOUNTER — Ambulatory Visit (HOSPITAL_COMMUNITY): Payer: Commercial Managed Care - PPO

## 2018-05-18 ENCOUNTER — Ambulatory Visit (HOSPITAL_COMMUNITY): Payer: Commercial Managed Care - PPO

## 2018-05-18 ENCOUNTER — Other Ambulatory Visit (HOSPITAL_COMMUNITY): Payer: Commercial Managed Care - PPO

## 2018-05-23 LAB — HEPATITIS C ANTIBODY

## 2018-06-02 ENCOUNTER — Ambulatory Visit: Payer: Commercial Managed Care - PPO | Admitting: Physician Assistant

## 2018-06-13 ENCOUNTER — Ambulatory Visit (HOSPITAL_COMMUNITY)
Admission: RE | Admit: 2018-06-13 | Discharge: 2018-06-13 | Disposition: A | Discharge status: Home / Self Care | Payer: Commercial Managed Care - PPO | Source: Ambulatory Visit | Attending: Vascular Surgery | Admitting: Vascular Surgery

## 2018-06-13 ENCOUNTER — Ambulatory Visit (HOSPITAL_COMMUNITY): Payer: Commercial Managed Care - PPO

## 2018-06-13 ENCOUNTER — Ambulatory Visit (HOSPITAL_COMMUNITY): Admission: RE | Admit: 2018-06-13 | Payer: Commercial Managed Care - PPO | Source: Ambulatory Visit

## 2018-06-13 ENCOUNTER — Other Ambulatory Visit: Payer: Self-pay | Admitting: *Deleted

## 2018-06-13 ENCOUNTER — Encounter: Payer: Self-pay | Admitting: Vascular Surgery

## 2018-06-13 ENCOUNTER — Ambulatory Visit (INDEPENDENT_AMBULATORY_CARE_PROVIDER_SITE_OTHER): Payer: Commercial Managed Care - PPO | Admitting: Vascular Surgery

## 2018-06-13 VITALS — BP 159/94 | HR 71 | Temp 97.7°F | Resp 22 | Wt 260.0 lb

## 2018-06-13 DIAGNOSIS — N186 End stage renal disease: Secondary | ICD-10-CM

## 2018-06-13 DIAGNOSIS — Z992 Dependence on renal dialysis: Secondary | ICD-10-CM

## 2018-06-13 DIAGNOSIS — Z01810 Encounter for preprocedural cardiovascular examination: Secondary | ICD-10-CM | POA: Insufficient documentation

## 2018-06-13 DIAGNOSIS — Z01818 Encounter for other preprocedural examination: Secondary | ICD-10-CM

## 2018-06-13 NOTE — Progress Notes (Signed)
Vascular and Vein Specialist of [facility]  Patient name: Wesley [name] MRN: [id number] DOB: 1961-01-23 Sex: male  REASON FOR CONSULT: Discuss access for hemodialysis  Seen today in our [place] office   HPI: Wesley [name] is a 57 y.o. male, who is here today for discussion of access for hemodialysis.  Currently is undergoing dialysis via catheter placed at the time of his admission with acute renal failure at [facility].  He has done well with this access.  He is here today for discussion of permanent access.  He reports that he is essentially ambidextrous.  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  Does have diabetes as the presumed reason for his renal failure.  He is anemic related to his end-stage renal disease as well  Past Medical History:  Diagnosis Date  . Anasarca   . Anemia   . Cellulitis   . Diabetes mellitus without complication ([place])   . Hypertension   . Renal disorder     Family History  Problem Relation Age of Onset  . Cancer - Lung Father   . Congestive Heart Failure Mother   . Diabetes Mellitus II Mother   . Cervical cancer Mother   . Dementia Mother     SOCIAL HISTORY: Social History   Socioeconomic History  . Marital status: Married    Spouse name: Not on file  . Number of children: Not on file  . Years of education: Not on file  . Highest education level: Not on file  Occupational History  . Not on file  Social Needs  . Financial resource strain: Not on file  . Food insecurity:    Worry: Not on file    Inability: Not on file  . Transportation needs:    Medical: Not on file    Non-medical: Not on file  Tobacco Use  . Smoking status: Never Smoker  . Smokeless tobacco: Never Used  Substance and Sexual Activity  . Alcohol use: Not Currently    Alcohol/week: 0.0 standard drinks    Comment: rarely  . Drug use: No  . Sexual activity: Not on file    Lifestyle  . Physical activity:    Days per week: Not on file    Minutes per session: Not on file  . Stress: Not on file  Relationships  . Social connections:    Talks on phone: Not on file    Gets together: Not on file    Attends religious service: Not on file    Active member of club or organization: Not on file    Attends meetings of clubs or organizations: Not on file    Relationship status: Not on file  . Intimate partner violence:    Fear of current or ex partner: Not on file    Emotionally abused: Not on file    Physically abused: Not on file    Forced sexual activity: Not on file  Other Topics Concern  . Not on file  Social History Narrative  . Not on file    No Known Allergies  Current Outpatient Medications  Medication Sig Dispense Refill  . calcitRIOL (ROCALTROL) 0.5 MCG capsule Take 1 capsule (0.5 mcg total) by mouth daily. 30 capsule 2  . calcium acetate (PHOSLO) 667 MG capsule Take 2 capsules (1,334 mg total) by mouth 3 (three) times daily with meals. 180 capsule 2  . calcium acetate (PHOSLO) 667 MG capsule Take  1 capsule (667 mg total) by mouth with snacks. 30 capsule 2  . FERREX 150 150 MG capsule Take 150 mg by mouth daily.  5  . Omega-3 Fatty Acids (FISH OIL PO) Take 1 tablet by mouth daily.    Marland Kitchen PROLENSA 0.07 % SOLN INSTILL 1 DROP INTO LEFT EYE DAILY, START 5 DAYS PRIOR TO SURGERY AND CONTINUE UNTIL GONE  0  . torsemide (DEMADEX) 20 MG tablet Take 3 tablets (60 mg total) by mouth daily. 90 tablet 0   No current facility-administered medications for this visit.     REVIEW OF SYSTEMS:  [X]  denotes positive finding, [ ]  denotes negative finding Cardiac  Comments:  Chest pain or chest pressure:    Shortness of breath upon exertion: x   Short of breath when lying flat:    Irregular heart rhythm:        Vascular    Pain in calf, thigh, or hip brought on by ambulation:    Pain in feet at night that wakes you up from your sleep:     Blood clot in your  veins:    Leg swelling:  x       Pulmonary    Oxygen at home:    Productive cough:     Wheezing:         Neurologic    Sudden weakness in arms or legs:     Sudden numbness in arms or legs:     Sudden onset of difficulty speaking or slurred speech:    Temporary loss of vision in one eye:     Problems with dizziness:         Gastrointestinal    Blood in stool:     Vomited blood:         Genitourinary    Burning when urinating:     Blood in urine:        Psychiatric    Major depression:         Hematologic    Bleeding problems:    Problems with blood clotting too easily:        Skin    Rashes or ulcers:        Constitutional    Fever or chills:      PHYSICAL EXAM: Vitals:   06/13/18 1420 06/13/18 1422  BP: (!) 163/90 (!) 159/94  Pulse: 71 71  Resp: (!) 22   Temp: 97.7 F (36.5 C)   TempSrc: Temporal   Weight: 260 lb (117.9 kg)     GENERAL: The patient is a well-nourished male, in no acute distress. The vital signs are documented above. CARDIOVASCULAR: 2+ radial pulses bilaterally.  Large cephalic veins palpable at the wrist and antecubital space bilaterally PULMONARY: There is good air exchange  ABDOMEN: Soft and non-tender  MUSCULOSKELETAL: There are no major deformities or cyanosis. NEUROLOGIC: No focal weakness or paresthesias are detected. SKIN: There are no ulcers or rashes noted.  Swelling and hemosiderin deposits circumferentially at his legs PSYCHIATRIC: The patient has a normal affect.  DATA:  Bilateral upper extremity arterial and venous studies were obtained at [facility] today and were reviewed with the patient.  This reveals a large diameter cephalic vein from the wrist proximally bilaterally.  Arterial flow bilateral.  MEDICAL ISSUES: Gust options for hemodialysis with the patient to include catheter use, AV fistula and AV graft.  He has very nice diameter cephalic vein with a very superficial location bilaterally.  I have recommended  radiocephalic  AV fistula as his initial access.  I discussed option of right arm versus left arm.  He reports that he plays a guitar during dialysis and would prefer to have his hand without access.  Will plan outpatient left radiocephalic fistula.  He has hemodialysis on Monday Wednesday and Friday so therefore would schedule this for a Tuesday or Thursday.  Explained that our [facility] office will call him to schedule this and it in all likelihood would be 1 of the other surgeons in our practice doing the actual procedure.  I discussed the risks with the procedure as well   [name], MD [facility] Vascular and Vein Specialists of [facility] Tel [phone] Pager [phone]

## 2018-06-13 NOTE — Progress Notes (Signed)
Call to patient instructed to be at [facility] admitting department at 7:30 am on 06/21/18 for surgery with Dr. Scot Dock. NPO past MN night prior and must have a driver and caregiver for discharge to home. Expect a call and follow the detailed instructions received from the hospital pre-admission department for this surgery. Verbalized understanding to call if questions.

## 2018-06-20 ENCOUNTER — Encounter (HOSPITAL_COMMUNITY): Payer: Self-pay | Admitting: Physician Assistant

## 2018-06-20 ENCOUNTER — Telehealth: Payer: Self-pay | Admitting: *Deleted

## 2018-06-20 NOTE — Progress Notes (Addendum)
Anesthesia Chart Review: SAME DAY WORKUP    Case:  [id number] Date/Time:  06/21/18 1000   Procedure:  ARTERIOVENOUS (AV) FISTULA CREATION LEFT ARM (Left )   Anesthesia type:  Monitor Anesthesia Care   Pre-op diagnosis:  END STAGE RENAL DISEASE FOR HEMODIALYSIS ACCESS   Location:  [place] OR ROOM 11 / [employer] OR   Surgeon:  Angelia Mould, MD      DISCUSSION: 57 yo male never smoker for above procedure. Pertinent hx includes DMII, Anemia, Anasarca, HTN, ESRD on HD.  Recently admitted 8/15-8/19/2019 for worsening SOB in setting of ESRD. He was found to have progressive renal disease and evidence of non-ST elevation MI. Tunneled catheter placed and started HD on 8/16. Anemia of chronic disease.  Patient received 2 units PRBC during his hospitalization. Patient had a peak troponin of 8.12.  Seen by cardiology who felt that his elevated troponin is likely related to demand ischemia in the setting of severe anemia and renal failure.  Echocardiogram was ordered, patient did not wish to have this done in the hospital.  Plans are to follow-up with cardiology as an outpatient and consider outpatient echo versus stress testing.  At this time I cannot see that the pt followed up for outpatient cardiac eval. Discussed case with anesthesia. It was advised that the pt needs cardiac f/u with Echo prior to surgery. I relayed this to Dr. Nicole Cella scheduler and she said they will postpone the case until this can be done.  VS: There were no vitals taken for this visit.  PROVIDERS: Arsenio Katz, NP is PCP  Fran Lowes, MD is Nephrologist   LABS: Will need DOS labs. Most recent labs below. Results for ZARIAH, JOST (MRN [id number]) as of 06/20/2018 11:12  Ref. Range 05/16/2018 04:33  Sodium Latest Ref Range: 135 - 145 mmol/L 137  Potassium Latest Ref Range: 3.5 - 5.1 mmol/L 4.4  Chloride Latest Ref Range: 98 - 111 mmol/L 95 (L)  CO2 Latest Ref Range: 22 - 32 mmol/L 29  Glucose Latest Ref Range: 70 - 99  mg/dL 165 (H)  BUN Latest Ref Range: 6 - 20 mg/dL 74 (H)  Creatinine Latest Ref Range: 0.61 - 1.24 mg/dL 7.82 (H)  Calcium Latest Ref Range: 8.9 - 10.3 mg/dL 9.4  Anion gap Latest Ref Range: 5 - 15  13  Phosphorus Latest Ref Range: 2.5 - 4.6 mg/dL 5.9 (H)  Albumin Latest Ref Range: 3.5 - 5.0 g/dL 2.3 (L)  GFR, Est Non African American Latest Ref Range: >60 mL/min 7 (L)  GFR, Est African American Latest Ref Range: >60 mL/min 8 (L)  WBC Latest Ref Range: 4.0 - 10.5 K/uL 12.2 (H)  RBC Latest Ref Range: 4.22 - 5.81 MIL/uL 2.86 (L)  Hemoglobin Latest Ref Range: 13.0 - 17.0 g/dL 8.0 (L)  HCT Latest Ref Range: 39.0 - 52.0 % 26.5 (L)  MCV Latest Ref Range: 78.0 - 100.0 fL 92.7  MCH Latest Ref Range: 26.0 - 34.0 pg 28.0  MCHC Latest Ref Range: 30.0 - 36.0 g/dL 30.2  RDW Latest Ref Range: 11.5 - 15.5 % 15.1  Platelets Latest Ref Range: 150 - 400 K/uL 202    IMAGES: CHEST - 2 VIEW 05/12/2018  COMPARISON:  December 17, 2017  FINDINGS: There is no appreciable edema or consolidation. There is cardiomegaly with pulmonary venous hypertension. No adenopathy. There is aortic atherosclerosis. No bone lesions.  IMPRESSION: Persistent pulmonary vascular congestion without frank edema or consolidation. There is aortic atherosclerosis.  Aortic Atherosclerosis (ICD10-I70.0).  EKG: 05/12/2018: Sinus rhythm. Repol abnrm, severe global ischemia (LM/MVD)  CV: TTE 09/13/2014:  Study Conclusions  - Procedure narrative: Transthoracic echocardiography. Image quality was suboptimal. The study was technically difficult, as a result of poor sound wave transmission and body habitus. Intravenous contrast (Definity) was administered. - Left ventricle: The cavity size was normal. There was moderate concentric hypertrophy. Systolic function was normal. The estimated ejection fraction was in the range of 55% to 60%. While wall motion was difficult to assess in spite of contrast enhancement,  there were no gross regional wall motion abnormalities. Features are consistent with a pseudonormal left ventricular filling pattern, with concomitant abnormal relaxation and increased filling pressure (grade 2 diastolic dysfunction). Doppler parameters are consistent with high ventricular filling pressure. - Mitral valve: There was mild regurgitation. - Left atrium: The atrium was moderately to severely dilated. - Atrial septum: A patent foramen ovale cannot be excluded. - Tricuspid valve: There was mild regurgitation. - Systemic veins: IVC dilated, with normal respiratory variation. Estimated CVP 8 mmHg.  Past Medical History:  Diagnosis Date  . Anasarca   . Anemia   . Cellulitis   . Diabetes mellitus without complication ([place])   . Hypertension   . Renal disorder     Past Surgical History:  Procedure Laterality Date  . FOOT SURGERY     right  . INCISION AND DRAINAGE OF WOUND  04/03/2012   Procedure: IRRIGATION AND DEBRIDEMENT WOUND;  Surgeon: Scherry Ran, MD;  Location: [facility];  Service: General;  Laterality: Right;  . IR FLUORO GUIDE CV LINE RIGHT  05/13/2018  . IR US GUIDE VASC ACCESS RIGHT  05/13/2018    MEDICATIONS: No current facility-administered medications for this encounter.    . calcium acetate (PHOSLO) 667 MG capsule  . calcium acetate (PHOSLO) 667 MG capsule  . FERREX 150 150 MG capsule  . Omega-3 Fatty Acids (FISH OIL PO)  . torsemide (DEMADEX) 20 MG tablet  . calcitRIOL (ROCALTROL) 0.5 MCG capsule    Wynonia Musty [facility] Short Stay Center/Anesthesiology Phone [phone] 06/20/2018 11:36 AM

## 2018-06-20 NOTE — Telephone Encounter (Signed)
Per Karoline Caldwell, PA for Anesthesiology, this patient will not be cleared for surgery until he sees a cardiologist for an echocardiogram. I called the patient and he states that he could not make the outpatient appt that he was given at discharge due to it being on his dialysis day. He does not want to go to have the echo but after I educated him on the need, he has agreed to see cardiology for surgical preop workup including echocardiogram. Our office will contact the [facility] cardiology office at [phone] to reschedule his appt. Asap. We will reschedule his left arm AVF as soon as he is cleared from cardiology. He has dialysis on M,W,F at [place]. They are aware of this postponement of surgery.

## 2018-06-21 ENCOUNTER — Encounter (HOSPITAL_COMMUNITY): Admission: RE | Payer: Self-pay | Source: Ambulatory Visit

## 2018-06-21 ENCOUNTER — Ambulatory Visit (HOSPITAL_COMMUNITY)
Admission: RE | Admit: 2018-06-21 | Payer: Commercial Managed Care - PPO | Source: Ambulatory Visit | Admitting: Vascular Surgery

## 2018-06-21 ENCOUNTER — Telehealth: Payer: Self-pay | Admitting: Cardiovascular Disease

## 2018-06-21 SURGERY — ARTERIOVENOUS (AV) FISTULA CREATION
Anesthesia: Monitor Anesthesia Care | Laterality: Left

## 2018-06-21 NOTE — Telephone Encounter (Signed)
New Message:       [facility] Pre-operative Risk Assessment    Request for surgical clearance:  1. What type of surgery is being performed? Left Arm AVS  2. When is this surgery scheduled? TBD  3. What type of clearance is required (medical clearance vs. Pharmacy clearance to hold med vs. Both)? Medical  4. Are there any medications that need to be held prior to surgery and how long? NA   5. Practice name and name of physician performing surgery? Dr. Doren Custard  6. What is your office phone number [phone]   7.   What is your office fax number [phone]  8.   Anesthesia type (None, local, MAC, general) ? General    Wesley [name] 06/21/2018, 4:07 PM  _________________________________________________________________   (provider comments below)

## 2018-06-22 ENCOUNTER — Encounter: Payer: Self-pay | Admitting: Student

## 2018-06-22 ENCOUNTER — Ambulatory Visit (INDEPENDENT_AMBULATORY_CARE_PROVIDER_SITE_OTHER): Payer: Commercial Managed Care - PPO | Admitting: Student

## 2018-06-22 ENCOUNTER — Encounter: Payer: Self-pay | Admitting: *Deleted

## 2018-06-22 VITALS — BP 166/88 | HR 108 | Ht 70.0 in | Wt 269.0 lb

## 2018-06-22 DIAGNOSIS — Z01810 Encounter for preprocedural cardiovascular examination: Secondary | ICD-10-CM

## 2018-06-22 DIAGNOSIS — D649 Anemia, unspecified: Secondary | ICD-10-CM

## 2018-06-22 DIAGNOSIS — I10 Essential (primary) hypertension: Secondary | ICD-10-CM

## 2018-06-22 DIAGNOSIS — I4891 Unspecified atrial fibrillation: Secondary | ICD-10-CM | POA: Diagnosis not present

## 2018-06-22 DIAGNOSIS — I214 Non-ST elevation (NSTEMI) myocardial infarction: Secondary | ICD-10-CM

## 2018-06-22 DIAGNOSIS — N186 End stage renal disease: Secondary | ICD-10-CM

## 2018-06-22 MED ORDER — METOPROLOL TARTRATE 25 MG PO TABS: 25.0000 mg | ORAL_TABLET | Freq: Two times a day (BID) | ORAL | 3 refills | Status: DC

## 2018-06-22 NOTE — H&P (View-Only) (Signed)
Cardiology Office Note    Date:  06/23/2018   ID:  Wesley [name], DOB Sep 17, 1961, MRN [id number]  PCP:  Arsenio Katz, NP  Cardiologist: Kate Sable, MD    Chief Complaint  Patient presents with  . Pre-op Exam    History of Present Illness:    Wesley [name] is a 57 y.o. male with past medical history of chronic diastolic CHF, Type 2 DM, HTN, ESRD, and chronic anemia who presents to the office today for hospital follow-up and cardiac clearance for upcoming AV fistula placement.   He was recently admitted to [facility] from 8/15 - 05/16/2018 for worsening fatigue and weakness, found to be significantly anemic with Hgb of 6.9 and creatinine elevated to 11.71. Nephrology was consulted upon the time of admission and he underwent placement of a tunneled catheter at [facility] and was started on HD. Cardiology was consulted as he was found to have elevated troponin enzymes, peaking at 8.12. EKG also showed diffuse TWI along the lateral leads which was more prominent than compared to prior tracings. He denied any recent chest pain and it was thought his NSTEMI was due to demand ischemia in the setting of his anemia and renal failure. An echocardiogram was ordered during admission but the patient refused. Therefore, it was recommended he have close follow-up with outpatient stress testing recommended. He was started on Atorvastatin 40mg  daily and Lopressor 25mg  BID. Was not started on antiplatelet medications given his significant anemia. A follow-up visit had been made on 06/02/2018 but the patient cancelled this and did not reschedule.   He was evaluated by Dr. Donnetta Hutching with Vascular Surgery in the interim for plans to undergo radiocephalic AV fistula placement on 06/21/2018 but upon meeting with anesthesia on 06/20/2018, surgery was cancelled due to the need for cardiac clearance.   In talking with the patient today, he initially expresses some frustration that his surgery was postponed due to the  need for cardiac clearance. He denies any recent chest pain and says that his respiratory status has significantly improved since being started on hemodialysis. He is active at baseline and says he hiked a trail this past weekend without any anginal symptoms. He denies any specific palpitations, orthopnea, PND, or lower extremity edema.  He reports having been informed at hemodialysis that his heart rate was "irregular" but does not believe an EKG was ever performed. Says that labs are being obtained on a weekly basis and his hemoglobin is staying in the 8.0 range.  He denies any recent melena, hematochezia, or hematuria.   Past Medical History:  Diagnosis Date  . Anasarca   . Anemia   . Cellulitis   . Diabetes mellitus without complication ([place])   . Hypertension   . Renal disorder     Past Surgical History:  Procedure Laterality Date  . FOOT SURGERY     right  . INCISION AND DRAINAGE OF WOUND  04/03/2012   Procedure: IRRIGATION AND DEBRIDEMENT WOUND;  Surgeon: Scherry Ran, MD;  Location: [facility];  Service: General;  Laterality: Right;  . IR FLUORO GUIDE CV LINE RIGHT  05/13/2018  . IR US GUIDE VASC ACCESS RIGHT  05/13/2018    Current Medications: Outpatient Medications Prior to Visit  Medication Sig Dispense Refill  . FERREX 150 150 MG capsule Take 150 mg by mouth every evening.   5  . Omega-3 Fatty Acids (FISH OIL PO) Take 1 tablet by mouth every evening.     . sevelamer carbonate (RENVELA)  800 MG tablet Take 800 mg by mouth 3 (three) times daily with meals.     . torsemide (DEMADEX) 20 MG tablet Take 3 tablets (60 mg total) by mouth daily. (Patient taking differently: Take 60 mg by mouth every evening. ) 90 tablet 0  . calcitRIOL (ROCALTROL) 0.5 MCG capsule Take 1 capsule (0.5 mcg total) by mouth daily. (Patient not taking: Reported on 06/16/2018) 30 capsule 2  . calcium acetate (PHOSLO) 667 MG capsule Take 2 capsules (1,334 mg total) by mouth 3 (three) times daily with meals.  180 capsule 2  . calcium acetate (PHOSLO) 667 MG capsule Take 1 capsule (667 mg total) by mouth with snacks. 30 capsule 2   No facility-administered medications prior to visit.      Allergies:   Patient has no known allergies.   Social History   Socioeconomic History  . Marital status: Married    Spouse name: Not on file  . Number of children: Not on file  . Years of education: Not on file  . Highest education level: Not on file  Occupational History  . Not on file  Social Needs  . Financial resource strain: Not on file  . Food insecurity:    Worry: Not on file    Inability: Not on file  . Transportation needs:    Medical: Not on file    Non-medical: Not on file  Tobacco Use  . Smoking status: Never Smoker  . Smokeless tobacco: Never Used  Substance and Sexual Activity  . Alcohol use: Not Currently    Alcohol/week: 0.0 standard drinks    Comment: rarely  . Drug use: No  . Sexual activity: Not on file  Lifestyle  . Physical activity:    Days per week: Not on file    Minutes per session: Not on file  . Stress: Not on file  Relationships  . Social connections:    Talks on phone: Not on file    Gets together: Not on file    Attends religious service: Not on file    Active member of club or organization: Not on file    Attends meetings of clubs or organizations: Not on file    Relationship status: Not on file  Other Topics Concern  . Not on file  Social History Narrative  . Not on file     Family History:  The patient's family history includes Cancer - Lung in his father; Cervical cancer in his mother; Congestive Heart Failure in his mother; Dementia in his mother; Diabetes Mellitus II in his mother.   Review of Systems:   Please see the history of present illness.     General:  No chills, fever, night sweats or weight changes.  Cardiovascular:  No chest pain, edema, orthopnea, palpitations, paroxysmal nocturnal dyspnea. Positive for dyspnea on exertion  (improved).  Dermatological: No rash, lesions/masses Respiratory: No cough, dyspnea Urologic: No hematuria, dysuria Abdominal:   No nausea, vomiting, diarrhea, bright red blood per rectum, melena, or hematemesis Neurologic:  No visual changes, wkns, changes in mental status. All other systems reviewed and are otherwise negative except as noted above.   Physical Exam:    VS:  BP (!) 166/88   Pulse (!) 108   Ht 5\' 10"  (1.778 m)   Wt 269 lb (122 kg)   SpO2 98%   BMI 38.60 kg/m    General: Well developed, well nourished Caucasian male appearing in no acute distress. Head: Normocephalic, atraumatic, sclera non-icteric, no xanthomas,  nares are without discharge.  Neck: No carotid bruits. JVD not elevated.  Lungs: Respirations regular and unlabored, without wheezes or rales.  Heart: Tachycardiac, irregularly irregular. No S3 or S4.  No murmur, no rubs, or gallops appreciated. Abdomen: Soft, non-tender, non-distended with normoactive bowel sounds. No hepatomegaly. No rebound/guarding. No obvious abdominal masses. Msk:  Strength and tone appear normal for age. No joint deformities or effusions. Extremities: No clubbing or cyanosis. No edema.  Distal pedal pulses are 2+ bilaterally. Neuro: Alert and oriented X 3. Moves all extremities spontaneously. No focal deficits noted. Psych:  Responds to questions appropriately with a normal affect. Skin: No rashes or lesions noted  Wt Readings from Last 3 Encounters:  06/22/18 269 lb (122 kg)  06/13/18 260 lb (117.9 kg)  05/16/18 257 lb 8 oz (116.8 kg)     Studies/Labs Reviewed:   EKG:  EKG is ordered today. The ekg ordered today demonstrates atrial fibrillation with RVR, HR 121, with LVH and TWI along the inferior and lateral leads.  Recent Labs: 05/12/2018: ALT 37 05/14/2018: Magnesium 2.4 05/16/2018: BUN 74; Creatinine, Ser 7.82; Hemoglobin 8.0; Platelets 202; Potassium 4.4; Sodium 137   Lipid Panel    Component Value Date/Time   CHOL 127  09/13/2014 0520   TRIG 102 09/13/2014 0520   HDL 30 (L) 09/13/2014 0520   CHOLHDL 4.2 09/13/2014 0520   VLDL 20 09/13/2014 0520   LDLCALC 77 09/13/2014 0520    Additional studies/ records that were reviewed today include:   Echocardiogram: 08/2014 Study Conclusions  - Procedure narrative: Transthoracic echocardiography. Image quality was suboptimal. The study was technically difficult, as a result of poor sound wave transmission and body habitus. Intravenous contrast (Definity) was administered. - Left ventricle: The cavity size was normal. There was moderate concentric hypertrophy. Systolic function was normal. The estimated ejection fraction was in the range of 55% to 60%. While wall motion was difficult to assess in spite of contrast enhancement, there were no gross regional wall motion abnormalities. Features are consistent with a pseudonormal left ventricular filling pattern, with concomitant abnormal relaxation and increased filling pressure (grade 2 diastolic dysfunction). Doppler parameters are consistent with high ventricular filling pressure. - Mitral valve: There was mild regurgitation. - Left atrium: The atrium was moderately to severely dilated. - Atrial septum: A patent foramen ovale cannot be excluded. - Tricuspid valve: There was mild regurgitation. - Systemic veins: IVC dilated, with normal respiratory variation. Estimated CVP 8 mmHg.  Assessment:    1. Preoperative cardiovascular examination   2. Non-ST elevation myocardial infarction (NSTEMI), subsequent episode of care ([place])   3. Atrial fibrillation with RVR ([place])   4. Essential hypertension   5. Anemia, unspecified type   6. ESRD (end stage renal disease) ([place])      Plan:   In order of problems listed above:  1. Preoperative Cardiac Clearance for AV Fistula Placement - He was recently found to have an elevated troponin value of 8.12 during his recent admission in the  setting of anemia and ESRD but declined further testing at that time. He is now planning to undergo AV fistula placement and cardiac clearance is warranted. Reviewed with Dr. Bronson Ing prior to the patient's visit today and will plan to obtain an echocardiogram and Lexiscan Myoview for ischemic evaluation as outlined at the time of his recent hospitalization.   2. Recent Type 2 NSTEMI - Troponin values peaked at 8.12 during his recent admission which was thought to be secondary to demand ischemia in the  setting of significant anemia and ESRD. He declined further cardiac testing at that time, including an echocardiogram. - He denies any recent episodes of chest pain or dyspnea on exertion but given his significant cardiac enzyme elevation, will plan for an echocardiogram and nuclear stress test as outlined above.  3. New-Onset Atrial Fibrillation - This is a new diagnosis for the patient during his office visit today as he was in normal sinus rhythm during his recent admission. He does report that he was informed during hemodialysis that his heart rate was very irregular.  Heart rate was initially elevated to 127, improved to 108 on recheck. Reviewed with Dr. Domenic Polite (DOD) and will plan to start Lopressor 25 mg twice daily for rate control. - he has weekly labs during HD. Recommended that a TSH be checked along with repeat BMET and CBC. Likely that his arrhythmia is being driven by his underlying anemia.  - His CHA2DS2-VASc Score and unadjusted Ischemic Stroke Rate (% per year) is at least equal to 2.2 % stroke rate/year from a score of 2 (HTN, DM). Would not start anticoagulation at this time given he has baseline anemia.  3. HTN - BP significantly elevated to 204/90 on initial check, improved to 166/88 on recheck. He reports this has been variable in the 110's -150's/70's - 80's when checked at home and during HD.  - He is not currently on any antihypertensive medications. Will plan to add Lopressor  25 mg twice daily as outlined above.  4. Anemia - Hemoglobin has been staying in the 8.0 range by his report. He denies any evidence of active bleeding. Followed closely by Nephrology.  5. ESRD - undergoing HD on MWF schedule. Will route today's note to Dr. Lowanda Foster.    Medication Adjustments/Labs and Tests Ordered: Current medicines are reviewed at length with the patient today.  Concerns regarding medicines are outlined above.  Medication changes, Labs and Tests ordered today are listed in the Patient Instructions below. Patient Instructions  Medication Instructions:  Your physician recommends that you continue on your current medications as directed. Please refer to the Current Medication list given to you today. Start Lopressor 25 mg Two Times daily   Labwork: NONE   Testing/Procedures: Your physician has requested that you have an echocardiogram. Echocardiography is a painless test that uses sound waves to create images of your heart. It provides your doctor with information about the size and shape of your heart and how well your heart's chambers and valves are working. This procedure takes approximately one hour. There are no restrictions for this procedure.  Your physician has requested that you have a lexiscan myoview. For further information please visit [email or web address]. Please follow instruction sheet, as given.    Follow-Up: Your physician recommends that you schedule a follow-up appointment in: 4 Weeks   Any Other Special Instructions Will Be Listed Below (If Applicable).     If you need a refill on your cardiac medications before your next appointment, please call your pharmacy. Thank you for choosing [facility]!       Signed, Erma Heritage, PA-C  06/23/2018 7:57 AM    [facility] [address] [place], [place] [address] Phone: [phone]

## 2018-06-22 NOTE — Progress Notes (Signed)
Cardiology Office Note    Date:  06/23/2018   ID:  Wesley [name], DOB 1961/05/24, MRN [id number]  PCP:  Arsenio Katz, NP  Cardiologist: Kate Sable, MD    Chief Complaint  Patient presents with  . Pre-op Exam    History of Present Illness:    Wesley [name] is a 57 y.o. male with past medical history of chronic diastolic CHF, Type 2 DM, HTN, ESRD, and chronic anemia who presents to the office today for hospital follow-up and cardiac clearance for upcoming AV fistula placement.   He was recently admitted to [facility] from 8/15 - 05/16/2018 for worsening fatigue and weakness, found to be significantly anemic with Hgb of 6.9 and creatinine elevated to 11.71. Nephrology was consulted upon the time of admission and he underwent placement of a tunneled catheter at [facility] and was started on HD. Cardiology was consulted as he was found to have elevated troponin enzymes, peaking at 8.12. EKG also showed diffuse TWI along the lateral leads which was more prominent than compared to prior tracings. He denied any recent chest pain and it was thought his NSTEMI was due to demand ischemia in the setting of his anemia and renal failure. An echocardiogram was ordered during admission but the patient refused. Therefore, it was recommended he have close follow-up with outpatient stress testing recommended. He was started on Atorvastatin 40mg  daily and Lopressor 25mg  BID. Was not started on antiplatelet medications given his significant anemia. A follow-up visit had been made on 06/02/2018 but the patient cancelled this and did not reschedule.   He was evaluated by Dr. Donnetta Hutching with Vascular Surgery in the interim for plans to undergo radiocephalic AV fistula placement on 06/21/2018 but upon meeting with anesthesia on 06/20/2018, surgery was cancelled due to the need for cardiac clearance.   In talking with the patient today, he initially expresses some frustration that his surgery was postponed due to the  need for cardiac clearance. He denies any recent chest pain and says that his respiratory status has significantly improved since being started on hemodialysis. He is active at baseline and says he hiked a trail this past weekend without any anginal symptoms. He denies any specific palpitations, orthopnea, PND, or lower extremity edema.  He reports having been informed at hemodialysis that his heart rate was "irregular" but does not believe an EKG was ever performed. Says that labs are being obtained on a weekly basis and his hemoglobin is staying in the 8.0 range.  He denies any recent melena, hematochezia, or hematuria.   Past Medical History:  Diagnosis Date  . Anasarca   . Anemia   . Cellulitis   . Diabetes mellitus without complication ([place])   . Hypertension   . Renal disorder     Past Surgical History:  Procedure Laterality Date  . FOOT SURGERY     right  . INCISION AND DRAINAGE OF WOUND  04/03/2012   Procedure: IRRIGATION AND DEBRIDEMENT WOUND;  Surgeon: Scherry Ran, MD;  Location: [facility];  Service: General;  Laterality: Right;  . IR FLUORO GUIDE CV LINE RIGHT  05/13/2018  . IR US GUIDE VASC ACCESS RIGHT  05/13/2018    Current Medications: Outpatient Medications Prior to Visit  Medication Sig Dispense Refill  . FERREX 150 150 MG capsule Take 150 mg by mouth every evening.   5  . Omega-3 Fatty Acids (FISH OIL PO) Take 1 tablet by mouth every evening.     . sevelamer carbonate (RENVELA)  800 MG tablet Take 800 mg by mouth 3 (three) times daily with meals.     . torsemide (DEMADEX) 20 MG tablet Take 3 tablets (60 mg total) by mouth daily. (Patient taking differently: Take 60 mg by mouth every evening. ) 90 tablet 0  . calcitRIOL (ROCALTROL) 0.5 MCG capsule Take 1 capsule (0.5 mcg total) by mouth daily. (Patient not taking: Reported on 06/16/2018) 30 capsule 2  . calcium acetate (PHOSLO) 667 MG capsule Take 2 capsules (1,334 mg total) by mouth 3 (three) times daily with meals.  180 capsule 2  . calcium acetate (PHOSLO) 667 MG capsule Take 1 capsule (667 mg total) by mouth with snacks. 30 capsule 2   No facility-administered medications prior to visit.      Allergies:   Patient has no known allergies.   Social History   Socioeconomic History  . Marital status: Married    Spouse name: Not on file  . Number of children: Not on file  . Years of education: Not on file  . Highest education level: Not on file  Occupational History  . Not on file  Social Needs  . Financial resource strain: Not on file  . Food insecurity:    Worry: Not on file    Inability: Not on file  . Transportation needs:    Medical: Not on file    Non-medical: Not on file  Tobacco Use  . Smoking status: Never Smoker  . Smokeless tobacco: Never Used  Substance and Sexual Activity  . Alcohol use: Not Currently    Alcohol/week: 0.0 standard drinks    Comment: rarely  . Drug use: No  . Sexual activity: Not on file  Lifestyle  . Physical activity:    Days per week: Not on file    Minutes per session: Not on file  . Stress: Not on file  Relationships  . Social connections:    Talks on phone: Not on file    Gets together: Not on file    Attends religious service: Not on file    Active member of club or organization: Not on file    Attends meetings of clubs or organizations: Not on file    Relationship status: Not on file  Other Topics Concern  . Not on file  Social History Narrative  . Not on file     Family History:  The patient's family history includes Cancer - Lung in his father; Cervical cancer in his mother; Congestive Heart Failure in his mother; Dementia in his mother; Diabetes Mellitus II in his mother.   Review of Systems:   Please see the history of present illness.     General:  No chills, fever, night sweats or weight changes.  Cardiovascular:  No chest pain, edema, orthopnea, palpitations, paroxysmal nocturnal dyspnea. Positive for dyspnea on exertion  (improved).  Dermatological: No rash, lesions/masses Respiratory: No cough, dyspnea Urologic: No hematuria, dysuria Abdominal:   No nausea, vomiting, diarrhea, bright red blood per rectum, melena, or hematemesis Neurologic:  No visual changes, wkns, changes in mental status. All other systems reviewed and are otherwise negative except as noted above.   Physical Exam:    VS:  BP (!) 166/88   Pulse (!) 108   Ht 5\' 10"  (1.778 m)   Wt 269 lb (122 kg)   SpO2 98%   BMI 38.60 kg/m    General: Well developed, well nourished Caucasian male appearing in no acute distress. Head: Normocephalic, atraumatic, sclera non-icteric, no xanthomas,  nares are without discharge.  Neck: No carotid bruits. JVD not elevated.  Lungs: Respirations regular and unlabored, without wheezes or rales.  Heart: Tachycardiac, irregularly irregular. No S3 or S4.  No murmur, no rubs, or gallops appreciated. Abdomen: Soft, non-tender, non-distended with normoactive bowel sounds. No hepatomegaly. No rebound/guarding. No obvious abdominal masses. Msk:  Strength and tone appear normal for age. No joint deformities or effusions. Extremities: No clubbing or cyanosis. No edema.  Distal pedal pulses are 2+ bilaterally. Neuro: Alert and oriented X 3. Moves all extremities spontaneously. No focal deficits noted. Psych:  Responds to questions appropriately with a normal affect. Skin: No rashes or lesions noted  Wt Readings from Last 3 Encounters:  06/22/18 269 lb (122 kg)  06/13/18 260 lb (117.9 kg)  05/16/18 257 lb 8 oz (116.8 kg)     Studies/Labs Reviewed:   EKG:  EKG is ordered today. The ekg ordered today demonstrates atrial fibrillation with RVR, HR 121, with LVH and TWI along the inferior and lateral leads.  Recent Labs: 05/12/2018: ALT 37 05/14/2018: Magnesium 2.4 05/16/2018: BUN 74; Creatinine, Ser 7.82; Hemoglobin 8.0; Platelets 202; Potassium 4.4; Sodium 137   Lipid Panel    Component Value Date/Time   CHOL 127  09/13/2014 0520   TRIG 102 09/13/2014 0520   HDL 30 (L) 09/13/2014 0520   CHOLHDL 4.2 09/13/2014 0520   VLDL 20 09/13/2014 0520   LDLCALC 77 09/13/2014 0520    Additional studies/ records that were reviewed today include:   Echocardiogram: 08/2014 Study Conclusions  - Procedure narrative: Transthoracic echocardiography. Image quality was suboptimal. The study was technically difficult, as a result of poor sound wave transmission and body habitus. Intravenous contrast (Definity) was administered. - Left ventricle: The cavity size was normal. There was moderate concentric hypertrophy. Systolic function was normal. The estimated ejection fraction was in the range of 55% to 60%. While wall motion was difficult to assess in spite of contrast enhancement, there were no gross regional wall motion abnormalities. Features are consistent with a pseudonormal left ventricular filling pattern, with concomitant abnormal relaxation and increased filling pressure (grade 2 diastolic dysfunction). Doppler parameters are consistent with high ventricular filling pressure. - Mitral valve: There was mild regurgitation. - Left atrium: The atrium was moderately to severely dilated. - Atrial septum: A patent foramen ovale cannot be excluded. - Tricuspid valve: There was mild regurgitation. - Systemic veins: IVC dilated, with normal respiratory variation. Estimated CVP 8 mmHg.  Assessment:    1. Preoperative cardiovascular examination   2. Non-ST elevation myocardial infarction (NSTEMI), subsequent episode of care ([place])   3. Atrial fibrillation with RVR ([place])   4. Essential hypertension   5. Anemia, unspecified type   6. ESRD (end stage renal disease) ([place])      Plan:   In order of problems listed above:  1. Preoperative Cardiac Clearance for AV Fistula Placement - He was recently found to have an elevated troponin value of 8.12 during his recent admission in the  setting of anemia and ESRD but declined further testing at that time. He is now planning to undergo AV fistula placement and cardiac clearance is warranted. Reviewed with Dr. Bronson Ing prior to the patient's visit today and will plan to obtain an echocardiogram and Lexiscan Myoview for ischemic evaluation as outlined at the time of his recent hospitalization.   2. Recent Type 2 NSTEMI - Troponin values peaked at 8.12 during his recent admission which was thought to be secondary to demand ischemia in the  setting of significant anemia and ESRD. He declined further cardiac testing at that time, including an echocardiogram. - He denies any recent episodes of chest pain or dyspnea on exertion but given his significant cardiac enzyme elevation, will plan for an echocardiogram and nuclear stress test as outlined above.  3. New-Onset Atrial Fibrillation - This is a new diagnosis for the patient during his office visit today as he was in normal sinus rhythm during his recent admission. He does report that he was informed during hemodialysis that his heart rate was very irregular.  Heart rate was initially elevated to 127, improved to 108 on recheck. Reviewed with Dr. Domenic Polite (DOD) and will plan to start Lopressor 25 mg twice daily for rate control. - he has weekly labs during HD. Recommended that a TSH be checked along with repeat BMET and CBC. Likely that his arrhythmia is being driven by his underlying anemia.  - His CHA2DS2-VASc Score and unadjusted Ischemic Stroke Rate (% per year) is at least equal to 2.2 % stroke rate/year from a score of 2 (HTN, DM). Would not start anticoagulation at this time given he has baseline anemia.  3. HTN - BP significantly elevated to 204/90 on initial check, improved to 166/88 on recheck. He reports this has been variable in the 110's -150's/70's - 80's when checked at home and during HD.  - He is not currently on any antihypertensive medications. Will plan to add Lopressor  25 mg twice daily as outlined above.  4. Anemia - Hemoglobin has been staying in the 8.0 range by his report. He denies any evidence of active bleeding. Followed closely by Nephrology.  5. ESRD - undergoing HD on MWF schedule. Will route today's note to Dr. Lowanda Foster.    Medication Adjustments/Labs and Tests Ordered: Current medicines are reviewed at length with the patient today.  Concerns regarding medicines are outlined above.  Medication changes, Labs and Tests ordered today are listed in the Patient Instructions below. Patient Instructions  Medication Instructions:  Your physician recommends that you continue on your current medications as directed. Please refer to the Current Medication list given to you today. Start Lopressor 25 mg Two Times daily   Labwork: NONE   Testing/Procedures: Your physician has requested that you have an echocardiogram. Echocardiography is a painless test that uses sound waves to create images of your heart. It provides your doctor with information about the size and shape of your heart and how well your heart's chambers and valves are working. This procedure takes approximately one hour. There are no restrictions for this procedure.  Your physician has requested that you have a lexiscan myoview. For further information please visit [email or web address]. Please follow instruction sheet, as given.    Follow-Up: Your physician recommends that you schedule a follow-up appointment in: 4 Weeks   Any Other Special Instructions Will Be Listed Below (If Applicable).     If you need a refill on your cardiac medications before your next appointment, please call your pharmacy. Thank you for choosing [facility]!       Signed, Erma Heritage, PA-C  06/23/2018 7:57 AM    [place] [address] [place], [place] [address] Phone: [phone]

## 2018-06-22 NOTE — Patient Instructions (Signed)
Medication Instructions:  Your physician recommends that you continue on your current medications as directed. Please refer to the Current Medication list given to you today. Start Lopressor 25 mg Two Times daily   Labwork: NONE   Testing/Procedures: Your physician has requested that you have an echocardiogram. Echocardiography is a painless test that uses sound waves to create images of your heart. It provides your doctor with information about the size and shape of your heart and how well your heart's chambers and valves are working. This procedure takes approximately one hour. There are no restrictions for this procedure.  Your physician has requested that you have a lexiscan myoview. For further information please visit [email or web address]. Please follow instruction sheet, as given.    Follow-Up: Your physician recommends that you schedule a follow-up appointment in: 4 Weeks   Any Other Special Instructions Will Be Listed Below (If Applicable).     If you need a refill on your cardiac medications before your next appointment, please call your pharmacy. Thank you for choosing [facility]!

## 2018-06-23 ENCOUNTER — Encounter: Payer: Self-pay | Admitting: Student

## 2018-06-28 ENCOUNTER — Encounter (HOSPITAL_COMMUNITY): Payer: Commercial Managed Care - PPO

## 2018-06-28 ENCOUNTER — Ambulatory Visit (HOSPITAL_COMMUNITY)
Admission: RE | Admit: 2018-06-28 | Discharge: 2018-06-28 | Disposition: A | Discharge status: Home / Self Care | Payer: Commercial Managed Care - PPO | Source: Ambulatory Visit | Attending: Student | Admitting: Student

## 2018-06-28 ENCOUNTER — Telehealth: Payer: Self-pay | Admitting: Student

## 2018-06-28 DIAGNOSIS — I110 Hypertensive heart disease with heart failure: Secondary | ICD-10-CM | POA: Diagnosis not present

## 2018-06-28 DIAGNOSIS — E119 Type 2 diabetes mellitus without complications: Secondary | ICD-10-CM | POA: Insufficient documentation

## 2018-06-28 DIAGNOSIS — I214 Non-ST elevation (NSTEMI) myocardial infarction: Secondary | ICD-10-CM | POA: Diagnosis present

## 2018-06-28 DIAGNOSIS — I509 Heart failure, unspecified: Secondary | ICD-10-CM | POA: Diagnosis not present

## 2018-06-28 MED ORDER — PERFLUTREN LIPID MICROSPHERE
1.0000 mL | INTRAVENOUS | Status: AC | PRN
  Administered 2018-06-28: 1 mL via INTRAVENOUS

## 2018-06-28 NOTE — Progress Notes (Signed)
*  PRELIMINARY RESULTS* Echocardiogram 2D Echocardiogram with definity has been performed.  Leavy Cella 06/28/2018, 9:32 AM

## 2018-06-28 NOTE — Telephone Encounter (Signed)
   Patient presented for planned echocardiogram and stress testing today as outlined by prior office notes on 06/22/2018 in regards to preoperative cardiac clearance. Dr. Harl Bowie informed me that his EF was reduced to 20 to 25% by echocardiogram imaging with diffuse hypokinesis. This was also reviewed with Dr. Bronson Ing (patient's primary Cardiologist) and the recommendations were to proceed with a cardiac catheterization for definitive evaluation instead of stress testing given his cardiomyopathy.  The procedure was reviewed in detail with the patient and he agrees to proceed. The patient understands that risks include but are not limited to stroke (1 in 1000), death (1 in 67), kidney failure [usually temporary] (1 in 500), bleeding (1 in 200), allergic reaction [possibly serious] (1 in 200).   Due to the patient's availability and him having HD every MWF, his catheterization has been scheduled for 07/05/2018 with Dr. Irish Lack. Hopefully, he will not require intervention but it was reviewed with the patient that if PCI is indicated, then this could delay AV Fistula placement. Will request labs from his most recent HD session as Hgb was 8.0 in 04/2018 and he reports it has remained in this range (he has denied any evidence of active bleeding - being followed by Nephrology).   Signed, Erma Heritage, PA-C 06/28/2018, 11:05 AM Pager: [phone]

## 2018-07-04 ENCOUNTER — Telehealth: Payer: Self-pay | Admitting: *Deleted

## 2018-07-04 NOTE — Telephone Encounter (Signed)
I called [facility] [phone]), spoke with Amber.  The last lab done there was 06/06/18, included CBC (Hgb 8.1)and renal function, a copy of these results have been faxed to [facility] and [facility].  Dr Irish Lack made aware last Hgb 8.1 done 06/06/18, he asked if Hgb could be done at dialysis today. Per Amber-results from lab done today would not be available until Wednesday, they do not do stat lab. Dr Irish Lack made aware if Hgb is ordered  today at dialysis results would not be available until Wednesday so no Hgb ordered-will plan to get BMP/CBC/PT/INR(Hgb <10) stat on arrival at hospital tomorrow.

## 2018-07-04 NOTE — Telephone Encounter (Signed)
I received voice mail message again when I called pt to discuss procedure instructions for procedure tomorrow.

## 2018-07-04 NOTE — Telephone Encounter (Signed)
I discussed instructions with patient, he verbalized understanding, thanked me for call.

## 2018-07-04 NOTE — Telephone Encounter (Signed)
Follow up   Patient is calling in reference to getting instructions for his procedure on 07/05/2018.

## 2018-07-04 NOTE — Telephone Encounter (Addendum)
Pt contacted pre-catheterization scheduled at [facility] for: Tuesday July 05, 2018 9 AM Verified arrival time and place: [place] Entrance A at: 7 AM  No solid food after midnight prior to cath, clear liquids until 5 AM day of procedure. Verify allergies in Epic Verify no diabetes medications.  Hold: Torsemide -AM of procedure.  AM meds can be  taken pre-cath with sip of water including: ASA 81 mg  Confirm patient has responsible person to drive home post procedure and for 24 hours after you arrive home.   LMTCB for pt to discuss instructions.  [place] is checking to see if they have obtained last lab results from HD.

## 2018-07-05 ENCOUNTER — Encounter (HOSPITAL_COMMUNITY): Payer: Self-pay | Admitting: Interventional Cardiology

## 2018-07-05 ENCOUNTER — Ambulatory Visit (HOSPITAL_COMMUNITY)
Admission: RE | Admit: 2018-07-05 | Discharge: 2018-07-05 | Disposition: A | Discharge status: Home / Self Care | Payer: Commercial Managed Care - PPO | Source: Ambulatory Visit | Attending: Interventional Cardiology | Admitting: Interventional Cardiology

## 2018-07-05 ENCOUNTER — Encounter (HOSPITAL_COMMUNITY)
Admission: RE | Disposition: A | Discharge status: Home / Self Care | Payer: Self-pay | Source: Ambulatory Visit | Attending: Interventional Cardiology

## 2018-07-05 DIAGNOSIS — I272 Pulmonary hypertension, unspecified: Secondary | ICD-10-CM | POA: Diagnosis not present

## 2018-07-05 DIAGNOSIS — I5043 Acute on chronic combined systolic (congestive) and diastolic (congestive) heart failure: Secondary | ICD-10-CM | POA: Insufficient documentation

## 2018-07-05 DIAGNOSIS — Z992 Dependence on renal dialysis: Secondary | ICD-10-CM | POA: Insufficient documentation

## 2018-07-05 DIAGNOSIS — N186 End stage renal disease: Secondary | ICD-10-CM | POA: Diagnosis not present

## 2018-07-05 DIAGNOSIS — D649 Anemia, unspecified: Secondary | ICD-10-CM | POA: Insufficient documentation

## 2018-07-05 DIAGNOSIS — I214 Non-ST elevation (NSTEMI) myocardial infarction: Secondary | ICD-10-CM | POA: Insufficient documentation

## 2018-07-05 DIAGNOSIS — Z8249 Family history of ischemic heart disease and other diseases of the circulatory system: Secondary | ICD-10-CM | POA: Diagnosis not present

## 2018-07-05 DIAGNOSIS — I132 Hypertensive heart and chronic kidney disease with heart failure and with stage 5 chronic kidney disease, or end stage renal disease: Secondary | ICD-10-CM | POA: Insufficient documentation

## 2018-07-05 DIAGNOSIS — I252 Old myocardial infarction: Secondary | ICD-10-CM | POA: Diagnosis not present

## 2018-07-05 DIAGNOSIS — Z833 Family history of diabetes mellitus: Secondary | ICD-10-CM | POA: Insufficient documentation

## 2018-07-05 DIAGNOSIS — I5021 Acute systolic (congestive) heart failure: Secondary | ICD-10-CM | POA: Diagnosis not present

## 2018-07-05 DIAGNOSIS — I2510 Atherosclerotic heart disease of native coronary artery without angina pectoris: Secondary | ICD-10-CM | POA: Insufficient documentation

## 2018-07-05 DIAGNOSIS — E1122 Type 2 diabetes mellitus with diabetic chronic kidney disease: Secondary | ICD-10-CM | POA: Diagnosis not present

## 2018-07-05 DIAGNOSIS — I4891 Unspecified atrial fibrillation: Secondary | ICD-10-CM | POA: Insufficient documentation

## 2018-07-05 DIAGNOSIS — I420 Dilated cardiomyopathy: Secondary | ICD-10-CM | POA: Insufficient documentation

## 2018-07-05 DIAGNOSIS — Z79899 Other long term (current) drug therapy: Secondary | ICD-10-CM | POA: Diagnosis not present

## 2018-07-05 DIAGNOSIS — Z9889 Other specified postprocedural states: Secondary | ICD-10-CM | POA: Insufficient documentation

## 2018-07-05 HISTORY — PX: RIGHT/LEFT HEART CATH AND CORONARY ANGIOGRAPHY: CATH118266

## 2018-07-05 LAB — POCT I-STAT 3, VENOUS BLOOD GAS (G3P V)
Bicarbonate: 25.8 mmol/L (ref 20.0–28.0)
O2 SAT: 58 %
PCO2 VEN: 47.4 mmHg (ref 44.0–60.0)
PO2 VEN: 32 mmHg (ref 32.0–45.0)
TCO2: 27 mmol/L (ref 22–32)
pH, Ven: 7.344 (ref 7.250–7.430)

## 2018-07-05 LAB — POCT I-STAT 3, ART BLOOD GAS (G3+)
Acid-base deficit: 2 mmol/L (ref 0.0–2.0)
BICARBONATE: 24.6 mmol/L (ref 20.0–28.0)
O2 Saturation: 97 %
PCO2 ART: 48.5 mmHg — AB (ref 32.0–48.0)
PH ART: 7.313 — AB (ref 7.350–7.450)
TCO2: 26 mmol/L (ref 22–32)
pO2, Arterial: 103 mmHg (ref 83.0–108.0)

## 2018-07-05 LAB — BASIC METABOLIC PANEL
Anion gap: 11 (ref 5–15)
BUN: 41 mg/dL — AB (ref 6–20)
CHLORIDE: 101 mmol/L (ref 98–111)
CO2: 24 mmol/L (ref 22–32)
CREATININE: 5.54 mg/dL — AB (ref 0.61–1.24)
Calcium: 7.8 mg/dL — ABNORMAL LOW (ref 8.9–10.3)
GFR calc Af Amer: 12 mL/min — ABNORMAL LOW (ref >60)
GFR calc non Af Amer: 10 mL/min — ABNORMAL LOW (ref >60)
Glucose, Bld: 138 mg/dL — ABNORMAL HIGH (ref 70–99)
POTASSIUM: 4.3 mmol/L (ref 3.5–5.1)
Sodium: 136 mmol/L (ref 135–145)

## 2018-07-05 LAB — CBC
HCT: 31.1 % — ABNORMAL LOW (ref 39.0–52.0)
HEMOGLOBIN: 9.3 g/dL — AB (ref 13.0–17.0)
MCH: 29.2 pg (ref 26.0–34.0)
MCHC: 29.9 g/dL — AB (ref 30.0–36.0)
MCV: 97.5 fL (ref 80.0–100.0)
Platelets: 212 10*3/uL (ref 150–400)
RBC: 3.19 MIL/uL — AB (ref 4.22–5.81)
RDW: 16.4 % — ABNORMAL HIGH (ref 11.5–15.5)
WBC: 7.4 10*3/uL (ref 4.0–10.5)

## 2018-07-05 LAB — GLUCOSE, CAPILLARY
GLUCOSE-CAPILLARY: 135 mg/dL — AB (ref 70–99)
Glucose-Capillary: 103 mg/dL — ABNORMAL HIGH (ref 70–99)

## 2018-07-05 SURGERY — RIGHT/LEFT HEART CATH AND CORONARY ANGIOGRAPHY
Anesthesia: LOCAL

## 2018-07-05 MED ORDER — MIDAZOLAM HCL 2 MG/2ML IJ SOLN
INTRAMUSCULAR | Status: AC
  Filled 2018-07-05: qty 2

## 2018-07-05 MED ORDER — FENTANYL CITRATE (PF) 100 MCG/2ML IJ SOLN
INTRAMUSCULAR | Status: DC | PRN
  Administered 2018-07-05 (×2): 25 ug via INTRAVENOUS

## 2018-07-05 MED ORDER — LIDOCAINE HCL (PF) 1 % IJ SOLN
INTRAMUSCULAR | Status: DC | PRN
  Administered 2018-07-05 (×2): 3 mL

## 2018-07-05 MED ORDER — IOHEXOL 350 MG/ML SOLN
INTRAVENOUS | Status: DC | PRN
  Administered 2018-07-05: 65 mL via INTRA_ARTERIAL

## 2018-07-05 MED ORDER — VERAPAMIL HCL 2.5 MG/ML IV SOLN
INTRAVENOUS | Status: AC
  Filled 2018-07-05: qty 2

## 2018-07-05 MED ORDER — SODIUM CHLORIDE 0.9 % IV SOLN: 250.0000 mL | INTRAVENOUS | Status: DC | PRN

## 2018-07-05 MED ORDER — FENTANYL CITRATE (PF) 100 MCG/2ML IJ SOLN
INTRAMUSCULAR | Status: AC
  Filled 2018-07-05: qty 2

## 2018-07-05 MED ORDER — ASPIRIN 81 MG PO CHEW: 81.0000 mg | CHEWABLE_TABLET | ORAL | Status: DC

## 2018-07-05 MED ORDER — HEPARIN (PORCINE) IN NACL 1000-0.9 UT/500ML-% IV SOLN
INTRAVENOUS | Status: AC
  Filled 2018-07-05: qty 1000

## 2018-07-05 MED ORDER — SODIUM CHLORIDE 0.9% FLUSH: 3.0000 mL | Freq: Two times a day (BID) | INTRAVENOUS | Status: DC

## 2018-07-05 MED ORDER — LIDOCAINE HCL (PF) 1 % IJ SOLN
INTRAMUSCULAR | Status: AC
  Filled 2018-07-05: qty 30

## 2018-07-05 MED ORDER — SODIUM CHLORIDE 0.9 % IV SOLN
INTRAVENOUS | Status: DC
  Administered 2018-07-05: 08:00:00 via INTRAVENOUS

## 2018-07-05 MED ORDER — VERAPAMIL HCL 2.5 MG/ML IV SOLN
INTRAVENOUS | Status: DC | PRN
  Administered 2018-07-05: 10 mL via INTRA_ARTERIAL

## 2018-07-05 MED ORDER — MIDAZOLAM HCL 2 MG/2ML IJ SOLN
INTRAMUSCULAR | Status: DC | PRN
  Administered 2018-07-05: 2 mg via INTRAVENOUS
  Administered 2018-07-05: 1 mg via INTRAVENOUS

## 2018-07-05 MED ORDER — SODIUM CHLORIDE 0.9% FLUSH: 3.0000 mL | INTRAVENOUS | Status: DC | PRN

## 2018-07-05 MED ORDER — HEPARIN SODIUM (PORCINE) 1000 UNIT/ML IJ SOLN
INTRAMUSCULAR | Status: DC | PRN
  Administered 2018-07-05: 5000 [IU] via INTRAVENOUS

## 2018-07-05 MED ORDER — ACETAMINOPHEN 325 MG PO TABS: 650.0000 mg | ORAL_TABLET | ORAL | Status: DC | PRN

## 2018-07-05 MED ORDER — HEPARIN (PORCINE) IN NACL 1000-0.9 UT/500ML-% IV SOLN
INTRAVENOUS | Status: DC | PRN
  Administered 2018-07-05 (×2): 500 mL

## 2018-07-05 MED ORDER — ONDANSETRON HCL 4 MG/2ML IJ SOLN: 4.0000 mg | Freq: Four times a day (QID) | INTRAMUSCULAR | Status: DC | PRN

## 2018-07-05 SURGICAL SUPPLY — 11 items

## 2018-07-05 NOTE — Interval H&P Note (Signed)
Cath Lab Visit (complete for each Cath Lab visit)  Clinical Evaluation Leading to the Procedure:   ACS: Yes.    Non-ACS:    Anginal Classification: CCS IV  Anti-ischemic medical therapy: Minimal Therapy (1 class of medications)  Non-Invasive Test Results: Intermediate-risk stress test findings: cardiac mortality 1-3%/year  Prior CABG: No previous CABG  Patient is insistent upon radial approach for cath despite the fact that he will need dialysis access.  I explained to him the issues with radial access and the AV fistula.  He assures me they will use the left arm for dialysis so we can use the right arm for cath.  He does not want femoral approach, and given his significant anemia, radial will give a lower chance of bleeding.     History and Physical Interval Note:  07/05/2018 9:21 AM  Wesley [name]  has presented today for surgery, with the diagnosis of heart failure  The various methods of treatment have been discussed with the patient and family. After consideration of risks, benefits and other options for treatment, the patient has consented to  Procedure(s): RIGHT/LEFT HEART CATH AND CORONARY ANGIOGRAPHY (N/A) as a surgical intervention .  The patient's history has been reviewed, patient examined, no change in status, stable for surgery.  I have reviewed the patient's chart and labs.  Questions were answered to the patient's satisfaction.     Larae Grooms

## 2018-07-05 NOTE — Discharge Instructions (Signed)
Drink plenty of fluids. Keep right arm at or above heart level for 24 hours. Radial Site Care Refer to this sheet in the next few weeks. These instructions provide you with information about caring for yourself after your procedure. Your health care provider may also give you more specific instructions. Your treatment has been planned according to current medical practices, but problems sometimes occur. Call your health care provider if you have any problems or questions after your procedure. What can I expect after the procedure? After your procedure, it is typical to have the following:  Bruising at the radial site that usually fades within 1-2 weeks.  Blood collecting in the tissue (hematoma) that may be painful to the touch. It should usually decrease in size and tenderness within 1-2 weeks.  Follow these instructions at home:  Take medicines only as directed by your health care provider.  You may shower 24-48 hours after the procedure or as directed by your health care provider. Remove the bandage (dressing) and gently wash the site with plain soap and water. Pat the area dry with a clean towel. Do not rub the site, because this may cause bleeding.  Do not take baths, swim, or use a hot tub until your health care provider approves.  Check your insertion site every day for redness, swelling, or drainage.  Do not apply powder or lotion to the site.  Do not flex or bend the affected arm for 24 hours or as directed by your health care provider.  Do not push or pull heavy objects with the affected arm for 24 hours or as directed by your health care provider.  Do not lift over 10 lb (4.5 kg) for 5 days after your procedure or as directed by your health care provider.  Ask your health care provider when it is okay to: ? Return to work or school. ? Resume usual physical activities or sports. ? Resume sexual activity.  Do not drive home if you are discharged the same day as the  procedure. Have someone else drive you.  You may drive 24 hours after the procedure unless otherwise instructed by your health care provider.  Do not operate machinery or power tools for 24 hours after the procedure.  If your procedure was done as an outpatient procedure, which means that you went home the same day as your procedure, a responsible adult should be with you for the first 24 hours after you arrive home.  Keep all follow-up visits as directed by your health care provider. This is important. Contact a health care provider if:  You have a fever.  You have chills.  You have increased bleeding from the radial site. Hold pressure on the site. Get help right away if:  You have unusual pain at the radial site.  You have redness, warmth, or swelling at the radial site.  You have drainage (other than a small amount of blood on the dressing) from the radial site.  The radial site is bleeding, and the bleeding does not stop after 30 minutes of holding steady pressure on the site.  Your arm or hand becomes pale, cool, tingly, or numb. This information is not intended to replace advice given to you by your health care provider. Make sure you discuss any questions you have with your health care provider. Document Released: 10/17/2010 Document Revised: 02/20/2016 Document Reviewed: 04/02/2014 Elsevier Interactive Patient Education  2018 [employer].

## 2018-07-06 ENCOUNTER — Other Ambulatory Visit: Payer: Self-pay | Admitting: *Deleted

## 2018-07-06 ENCOUNTER — Telehealth: Payer: Self-pay | Admitting: Student

## 2018-07-06 NOTE — Progress Notes (Signed)
Spoke with patient's wife to reschedule surgery past heart cath and cardiac clearance. Instructed to be at [facility] admitting at 5:30 am on 07/26/18 for creation of A/V fistula left arm. NPO past MN night prior and to expect a call and follow the detailed  instructions re: meds and this surgery. Verbalized understanding and to call this office if questions.

## 2018-07-06 NOTE — Telephone Encounter (Signed)
Called pt. No answer. Left message to call office. 

## 2018-07-06 NOTE — Telephone Encounter (Signed)
    Catheterization notes reviewed. Noted to have significant CAD but no critical lesions and medical therapy for his cardiomyopathy along with starting ASA 81mg  daily were recommended.  Kisha, please confirm with the patient he has started ASA 81mg  daily. He was prescribed Lopressor 25mg  BID at the time of his last office visit prior to [place] knowing about his cardiomyopathy. Would recommend switching to Toprol-XL 50mg  daily once he finishes his current bottle of Lopressor.   No further preoperative testing is indicated prior to AV Fistula placement. He is of intermediate risk from a cardiac perspective given his CAD and cardiomyopathy.   Will route today's note to Dr. Donnetta Hutching, Dr. Scot Dock and Karoline Caldwell, PA-C.   Signed, Erma Heritage, PA-C 07/06/2018, 7:25 AM Pager: [phone]

## 2018-07-07 ENCOUNTER — Other Ambulatory Visit: Payer: Self-pay | Admitting: *Deleted

## 2018-07-07 MED ORDER — ASPIRIN EC 81 MG PO TBEC: 81.0000 mg | DELAYED_RELEASE_TABLET | Freq: Every day | ORAL | 3 refills | Status: DC

## 2018-07-07 MED ORDER — METOPROLOL SUCCINATE ER 25 MG PO TB24: 25.0000 mg | ORAL_TABLET | Freq: Every day | ORAL | 11 refills | Status: DC

## 2018-07-07 NOTE — Telephone Encounter (Signed)
Pt informed. Orders placed.

## 2018-07-07 NOTE — Addendum Note (Signed)
Addended by: Levonne Hubert on: 07/07/2018 09:57 AM   Modules accepted: Orders

## 2018-07-07 NOTE — Progress Notes (Signed)
Surgery date changed at patient's request to 08/02/2018 arrival time 5:30 am. All other pre-op instructions unchanged. Spoke with patient's wife.

## 2018-07-21 ENCOUNTER — Encounter: Payer: Self-pay | Admitting: Nephrology

## 2018-08-01 ENCOUNTER — Encounter (HOSPITAL_COMMUNITY): Payer: Self-pay | Admitting: *Deleted

## 2018-08-01 ENCOUNTER — Other Ambulatory Visit: Payer: Self-pay

## 2018-08-01 NOTE — Progress Notes (Signed)
Pt denies SOB and chest pain. Pt under the care of Bernerd Pho, [place], Anesthesia. Pt denies having a stress test. Pt made aware to stop taking vitamins, fish oil and herbal medications. Do not take any NSAIDs ie: Ibuprofen, Advil, Naproxen (Aleve), Motrin, BC and Goody Powder.  Pt made aware to check BG every 2 hours prior to arrival to hospital on DOS. Pt made aware to treat a BG < 70 with 4 ounces of apple or cranberry juice, wait 15 minutes after intervention to recheck BG, if BG remains < 70, call Short Stay unit to speak with a nurse. Pt verbalized understanding of all pre-op instructions. See anesthesia note.

## 2018-08-01 NOTE — Anesthesia Preprocedure Evaluation (Addendum)
Anesthesia Evaluation  Patient identified by MRN, date of birth, ID band Patient awake    Reviewed: Allergy & Precautions, NPO status , Patient's Chart, lab work & pertinent test results, reviewed documented beta blocker date and time   Airway Mallampati: III  TM Distance: >3 FB Neck ROM: Full    Dental no notable dental hx.    Pulmonary neg pulmonary ROS,    Pulmonary exam normal breath sounds clear to auscultation       Cardiovascular hypertension, Pt. on home beta blockers + CAD, + Past MI and +CHF  Normal cardiovascular exam+ dysrhythmias Atrial Fibrillation  Rhythm:Regular Rate:Normal  Cardiac cath 07/05/18: [name] 1st Diag to 1st Diag lesion is 80% stenosed. Prox LAD to Mid LAD lesion is 70% stenosed. 1st Mrg lesion is 70% stenosed. Mid RCA lesion is 50% stenosed. Mid RCA to Dist RCA lesion is 80% stenosed. Dist RCA lesion is 100% stenosed. Left to right collaterals. LV end diastolic pressure is mildly elevated. There is no aortic valve stenosis. Hemodynamic findings consistent with moderate pulmonary hypertension. PA 53/28, mean PA 38 mm Hg; PCWP mean 28 mm Hg; CO 6.2 L/min; CI 2.7. PA sat 58%. Significant CAD.  No anginal sx.  No critical lesions noted.  Would continue medical therapy for decreased LVEF.  Compliance will have to be shown with his meds.  He is currently not taking his metoprolol per his preference.  After AV fistula creation, could reconsider cath and then committing him to DAPT if PCI needed.    Echo 06/28/18: Study Conclusions Left ventricle: The cavity size was normal. Wall thickness was increased in a pattern of moderate LVH. Systolic function was severely reduced. The estimated ejection fraction was in the range of 20% to 25%. Diffuse hypokinesis. The study is not technically sufficient to allow evaluation of LV diastolic function. Aortic valve: Mildly calcified annulus. Mildly thickened leaflets. Valve  area (VTI): 3.22 cm^2. Valve area (Vmax): 3.53 cm^2. Mitral valve: Mildly calcified annulus. Normal thickness leaflets. Left atrium: The atrium was moderately dilated. Right ventricle: The cavity size was mildly dilated. Systolic function was mildly reduced. Right atrium: The atrium was mildly dilated. Technically difficult study, echocontrast was used to enhance visualization.  LV EF: 20% -   25%  Sees cardiologist Irish Lack)   Neuro/Psych negative neurological ROS  negative psych ROS   GI/Hepatic negative GI ROS, Neg liver ROS,   Endo/Other  diabetes  Renal/GU Dialysis and ESRFRenal diseaseOn HD M, W, F     Musculoskeletal negative musculoskeletal ROS (+)   Abdominal   Peds  Hematology  (+) anemia ,   Anesthesia Other Findings END STAGE RENAL DISEASE FOR HEMODIALYSIS ACCESS  Reproductive/Obstetrics                           Anesthesia Physical Anesthesia Plan  ASA: IV  Anesthesia Plan: MAC   Post-op Pain Management:    Induction: Intravenous  PONV Risk Score and Plan: 1 and Propofol infusion and Treatment may vary due to age or medical condition  Airway Management Planned: Natural Airway  Additional Equipment:   Intra-op Plan:   Post-operative Plan:   Informed Consent: I have reviewed the patients History and Physical, chart, labs and discussed the procedure including the risks, benefits and alternatives for the proposed anesthesia with the patient or authorized representative who has indicated his/her understanding and acceptance.   Dental advisory given  Plan Discussed with: CRNA  Anesthesia Plan Comments: (See note  by Karoline Caldwell, PA-C from 06/20/18. Since then diagnosed with afib, severe LV dysfunction, significant CAD (no PCI 07/05/18). Echo and cath results outlined above. Per cardiology: "No further preoperative testing is indicated prior to AV Fistula placement. He is of intermediate risk from a cardiac perspective given his  CAD and cardiomyopathy." Myra Gianotti, PA-C . )    Anesthesia Quick Evaluation

## 2018-08-01 NOTE — Progress Notes (Signed)
   08/01/18 1907  OBSTRUCTIVE SLEEP APNEA  Have you ever been diagnosed with sleep apnea through a sleep study? No  Do you snore loudly (loud enough to be heard through closed doors)?  0  Do you often feel tired, fatigued, or sleepy during the daytime (such as falling asleep during driving or talking to someone)? 0  Has anyone observed you stop breathing during your sleep? 0  Do you have, or are you being treated for high blood pressure? 1  BMI more than 35 kg/m2? 1  Age > 50 (1-yes) 1  Neck circumference greater than:Male 16 inches or larger, Male 17inches or larger? 1  Male Gender (Yes=1) 1  Obstructive Sleep Apnea Score 5

## 2018-08-02 ENCOUNTER — Ambulatory Visit: Payer: Commercial Managed Care - PPO | Admitting: Student

## 2018-08-02 ENCOUNTER — Ambulatory Visit (HOSPITAL_COMMUNITY): Payer: Commercial Managed Care - PPO | Admitting: Physician Assistant

## 2018-08-02 ENCOUNTER — Encounter (HOSPITAL_COMMUNITY)
Admission: RE | Disposition: A | Discharge status: Home / Self Care | Payer: Self-pay | Source: Ambulatory Visit | Attending: Vascular Surgery

## 2018-08-02 ENCOUNTER — Encounter (HOSPITAL_COMMUNITY): Payer: Self-pay | Admitting: Certified Registered"

## 2018-08-02 ENCOUNTER — Ambulatory Visit (HOSPITAL_COMMUNITY)
Admission: RE | Admit: 2018-08-02 | Discharge: 2018-08-02 | Disposition: A | Discharge status: Home / Self Care | Payer: Commercial Managed Care - PPO | Source: Ambulatory Visit | Attending: Vascular Surgery | Admitting: Vascular Surgery

## 2018-08-02 DIAGNOSIS — I252 Old myocardial infarction: Secondary | ICD-10-CM | POA: Diagnosis not present

## 2018-08-02 DIAGNOSIS — N186 End stage renal disease: Secondary | ICD-10-CM | POA: Insufficient documentation

## 2018-08-02 DIAGNOSIS — Z79899 Other long term (current) drug therapy: Secondary | ICD-10-CM | POA: Diagnosis not present

## 2018-08-02 DIAGNOSIS — N178 Other acute kidney failure: Secondary | ICD-10-CM

## 2018-08-02 DIAGNOSIS — I132 Hypertensive heart and chronic kidney disease with heart failure and with stage 5 chronic kidney disease, or end stage renal disease: Secondary | ICD-10-CM | POA: Insufficient documentation

## 2018-08-02 DIAGNOSIS — I509 Heart failure, unspecified: Secondary | ICD-10-CM | POA: Diagnosis not present

## 2018-08-02 DIAGNOSIS — E1122 Type 2 diabetes mellitus with diabetic chronic kidney disease: Secondary | ICD-10-CM | POA: Diagnosis not present

## 2018-08-02 DIAGNOSIS — D631 Anemia in chronic kidney disease: Secondary | ICD-10-CM | POA: Diagnosis not present

## 2018-08-02 DIAGNOSIS — I2510 Atherosclerotic heart disease of native coronary artery without angina pectoris: Secondary | ICD-10-CM | POA: Diagnosis not present

## 2018-08-02 DIAGNOSIS — Z992 Dependence on renal dialysis: Secondary | ICD-10-CM | POA: Insufficient documentation

## 2018-08-02 DIAGNOSIS — N185 Chronic kidney disease, stage 5: Secondary | ICD-10-CM

## 2018-08-02 DIAGNOSIS — N184 Chronic kidney disease, stage 4 (severe): Secondary | ICD-10-CM

## 2018-08-02 HISTORY — DX: Atherosclerotic heart disease of native coronary artery without angina pectoris: I25.10

## 2018-08-02 HISTORY — DX: Heart disease, unspecified: I51.9

## 2018-08-02 HISTORY — DX: Unspecified atrial fibrillation: I48.91

## 2018-08-02 HISTORY — PX: AV FISTULA PLACEMENT: SHX1204

## 2018-08-02 LAB — GLUCOSE, CAPILLARY
GLUCOSE-CAPILLARY: 131 mg/dL — AB (ref 70–99)
GLUCOSE-CAPILLARY: 121 mg/dL — AB (ref 70–99)

## 2018-08-02 LAB — POCT I-STAT 4, (NA,K, GLUC, HGB,HCT)
Glucose, Bld: 135 mg/dL — ABNORMAL HIGH (ref 70–99)
HEMATOCRIT: 28 % — AB (ref 39.0–52.0)
Hemoglobin: 9.5 g/dL — ABNORMAL LOW (ref 13.0–17.0)
POTASSIUM: 4.5 mmol/L (ref 3.5–5.1)
SODIUM: 140 mmol/L (ref 135–145)

## 2018-08-02 SURGERY — ARTERIOVENOUS (AV) FISTULA CREATION
Anesthesia: Monitor Anesthesia Care | Site: Arm Lower | Laterality: Left

## 2018-08-02 MED ORDER — OXYCODONE-ACETAMINOPHEN 5-325 MG PO TABS: 1.0000 | ORAL_TABLET | Freq: Four times a day (QID) | ORAL | 0 refills | Status: DC | PRN

## 2018-08-02 MED ORDER — SODIUM CHLORIDE 0.9 % IV SOLN
INTRAVENOUS | Status: DC | PRN
  Administered 2018-08-02: 07:00:00

## 2018-08-02 MED ORDER — CHLORHEXIDINE GLUCONATE 4 % EX LIQD: 60.0000 mL | Freq: Once | CUTANEOUS | Status: DC

## 2018-08-02 MED ORDER — LIDOCAINE HCL (PF) 1 % IJ SOLN
INTRAMUSCULAR | Status: AC
  Filled 2018-08-02: qty 30

## 2018-08-02 MED ORDER — SODIUM CHLORIDE 0.9 % IV SOLN
INTRAVENOUS | Status: DC
  Administered 2018-08-02: 07:00:00 via INTRAVENOUS

## 2018-08-02 MED ORDER — MIDAZOLAM HCL 5 MG/5ML IJ SOLN
INTRAMUSCULAR | Status: DC | PRN
  Administered 2018-08-02: 2 mg via INTRAVENOUS

## 2018-08-02 MED ORDER — HEPARIN SODIUM (PORCINE) 1000 UNIT/ML IJ SOLN
INTRAMUSCULAR | Status: DC | PRN
  Administered 2018-08-02: 5000 [IU] via INTRAVENOUS

## 2018-08-02 MED ORDER — LIDOCAINE HCL (PF) 1 % IJ SOLN
INTRAMUSCULAR | Status: DC | PRN
  Administered 2018-08-02: 10 mL

## 2018-08-02 MED ORDER — 0.9 % SODIUM CHLORIDE (POUR BTL) OPTIME
TOPICAL | Status: DC | PRN
  Administered 2018-08-02: 1000 mL

## 2018-08-02 MED ORDER — SODIUM CHLORIDE 0.9 % IV SOLN
INTRAVENOUS | Status: AC
  Filled 2018-08-02: qty 1.2

## 2018-08-02 MED ORDER — LIDOCAINE-EPINEPHRINE (PF) 1 %-1:200000 IJ SOLN
INTRAMUSCULAR | Status: AC
  Filled 2018-08-02: qty 30

## 2018-08-02 MED ORDER — CEFAZOLIN SODIUM-DEXTROSE 2-4 GM/100ML-% IV SOLN
2.0000 g | INTRAVENOUS | Status: AC
  Administered 2018-08-02: 2 g via INTRAVENOUS

## 2018-08-02 MED ORDER — PROPOFOL 10 MG/ML IV BOLUS
INTRAVENOUS | Status: DC | PRN
  Administered 2018-08-02: 10 mg via INTRAVENOUS

## 2018-08-02 MED ORDER — MIDAZOLAM HCL 2 MG/2ML IJ SOLN
INTRAMUSCULAR | Status: AC
  Filled 2018-08-02: qty 2

## 2018-08-02 MED ORDER — FENTANYL CITRATE (PF) 250 MCG/5ML IJ SOLN
INTRAMUSCULAR | Status: AC
  Filled 2018-08-02: qty 5

## 2018-08-02 MED ORDER — FENTANYL CITRATE (PF) 100 MCG/2ML IJ SOLN
INTRAMUSCULAR | Status: DC | PRN
  Administered 2018-08-02: 50 ug via INTRAVENOUS

## 2018-08-02 MED ORDER — CEFAZOLIN SODIUM-DEXTROSE 2-4 GM/100ML-% IV SOLN
INTRAVENOUS | Status: AC
  Filled 2018-08-02: qty 100

## 2018-08-02 MED ORDER — PROPOFOL 500 MG/50ML IV EMUL
INTRAVENOUS | Status: DC | PRN
  Administered 2018-08-02: 75 ug/kg/min via INTRAVENOUS

## 2018-08-02 SURGICAL SUPPLY — 39 items
ARMBAND PINK RESTRICT EXTREMIT (MISCELLANEOUS) ×4 IMPLANT
CANISTER SUCT 3000ML PPV (MISCELLANEOUS) ×2 IMPLANT
CANNULA VESSEL 3MM 2 BLNT TIP (CANNULA) ×2 IMPLANT
CLIP VESOCCLUDE MED 6/CT (CLIP) ×2 IMPLANT
CLIP VESOCCLUDE SM WIDE 6/CT (CLIP) ×2 IMPLANT
COVER PROBE W GEL 5X96 (DRAPES) IMPLANT
COVER WAND RF STERILE (DRAPES) ×2 IMPLANT
DECANTER SPIKE VIAL GLASS SM (MISCELLANEOUS) ×2 IMPLANT
DERMABOND ADVANCED (GAUZE/BANDAGES/DRESSINGS) ×1
DERMABOND ADVANCED .7 DNX12 (GAUZE/BANDAGES/DRESSINGS) ×1 IMPLANT
DRAIN PENROSE 1/4X12 LTX STRL (WOUND CARE) ×2 IMPLANT
ELECT REM PT RETURN 9FT ADLT (ELECTROSURGICAL) ×2
ELECTRODE REM PT RTRN 9FT ADLT (ELECTROSURGICAL) ×1 IMPLANT
GLOVE BIO SURGEON STRL SZ 6.5 (GLOVE) ×4 IMPLANT
GLOVE BIO SURGEON STRL SZ7 (GLOVE) ×2 IMPLANT
GLOVE BIO SURGEON STRL SZ7.5 (GLOVE) ×2 IMPLANT
GLOVE BIOGEL PI IND STRL 6.5 (GLOVE) ×2 IMPLANT
GLOVE BIOGEL PI IND STRL 7.0 (GLOVE) ×2 IMPLANT
GLOVE BIOGEL PI INDICATOR 6.5 (GLOVE) ×2
GLOVE BIOGEL PI INDICATOR 7.0 (GLOVE) ×2
GLOVE SURG SS PI 6.0 STRL IVOR (GLOVE) ×4 IMPLANT
GLOVE SURG SS PI 6.5 STRL IVOR (GLOVE) ×2 IMPLANT
GOWN STRL REUS W/ TWL LRG LVL3 (GOWN DISPOSABLE) ×5 IMPLANT
GOWN STRL REUS W/TWL LRG LVL3 (GOWN DISPOSABLE) ×5
KIT BASIN OR (CUSTOM PROCEDURE TRAY) ×2 IMPLANT
KIT TURNOVER KIT B (KITS) ×2 IMPLANT
LOOP VESSEL MINI RED (MISCELLANEOUS) IMPLANT
NS IRRIG 1000ML POUR BTL (IV SOLUTION) ×2 IMPLANT
PACK CV ACCESS (CUSTOM PROCEDURE TRAY) ×2 IMPLANT
PAD ARMBOARD 7.5X6 YLW CONV (MISCELLANEOUS) ×4 IMPLANT
SPONGE SURGIFOAM ABS GEL 100 (HEMOSTASIS) IMPLANT
SUT PROLENE 6 0 BV (SUTURE) ×2 IMPLANT
SUT PROLENE 7 0 BV 1 (SUTURE) ×4 IMPLANT
SUT VIC AB 3-0 SH 27 (SUTURE) ×1
SUT VIC AB 3-0 SH 27X BRD (SUTURE) ×1 IMPLANT
SUT VICRYL 4-0 PS2 18IN ABS (SUTURE) ×2 IMPLANT
TOWEL GREEN STERILE (TOWEL DISPOSABLE) ×2 IMPLANT
UNDERPAD 30X30 (UNDERPADS AND DIAPERS) ×2 IMPLANT
WATER STERILE IRR 1000ML POUR (IV SOLUTION) ×2 IMPLANT

## 2018-08-02 NOTE — Anesthesia Procedure Notes (Signed)
Procedure Name: MAC Date/Time: 08/02/2018 7:45 AM Performed by: Moshe Salisbury, CRNA Pre-anesthesia Checklist: Patient identified, Emergency Drugs available, Suction available, Patient being monitored and Timeout performed Patient Re-evaluated:Patient Re-evaluated prior to induction Oxygen Delivery Method: Nasal cannula Ventilation: Nasal airway inserted- appropriate to patient size Dental Injury: Teeth and Oropharynx as per pre-operative assessment

## 2018-08-02 NOTE — Transfer of Care (Signed)
Immediate Anesthesia Transfer of Care Note  Patient: Wesley [name]  Procedure(s) Performed: CREATION RADIOCEPHALIC ARTERIOVENOUS FISTULA LEFT ARM (Left Arm Lower)  Patient Location: PACU  Anesthesia Type:MAC  Level of Consciousness: drowsy and patient cooperative  Airway & Oxygen Therapy: Patient Spontanous Breathing and Patient connected to nasal cannula oxygen  Post-op Assessment: Report given to RN, Post -op Vital signs reviewed and stable and Patient moving all extremities  Post vital signs: Reviewed and stable  Last Vitals:  Vitals Value Taken Time  BP 109/83 08/02/2018  8:52 AM  Temp    Pulse 92 08/02/2018  8:51 AM  Resp 17 08/02/2018  8:51 AM  SpO2 97 % 08/02/2018  8:51 AM  Vitals shown include unvalidated device data.  Last Pain:  Vitals:   08/02/18 0611  TempSrc:   PainSc: 0-No pain      Patients Stated Pain Goal: 2 (70/96/43 8381)  Complications: No apparent anesthesia complications

## 2018-08-02 NOTE — Anesthesia Postprocedure Evaluation (Signed)
Anesthesia Post Note  Patient: Wesley [name]  Procedure(s) Performed: CREATION RADIOCEPHALIC ARTERIOVENOUS FISTULA LEFT ARM (Left Arm Lower)     Patient location during evaluation: PACU Anesthesia Type: MAC Level of consciousness: awake and alert Pain management: pain level controlled Vital Signs Assessment: post-procedure vital signs reviewed and stable Respiratory status: spontaneous breathing, nonlabored ventilation, respiratory function stable and patient connected to nasal cannula oxygen Cardiovascular status: stable and blood pressure returned to baseline Postop Assessment: no apparent nausea or vomiting Anesthetic complications: no    Last Vitals:  Vitals:   08/02/18 0903 08/02/18 0944  BP: 126/73 121/74  Pulse: 91 90  Resp: 18 14  Temp: (!) 36.3 C (!) 36.3 C  SpO2: 100% 99%    Last Pain:  Vitals:   08/02/18 0910  TempSrc:   PainSc: 0-No pain                 Ryan P Ellender

## 2018-08-02 NOTE — Op Note (Signed)
Procedure: Left Radial Cephalic AV fistula   Preop: ESRD   Postop: ESRD   Anesthesia: MAC with local   Assistant: Leontine Locket, PA-c   Findings: 3 mm cephalic vein       2.5 mm radial artery moderate calcification   Procedure Details:  The left upper extremity was prepped and draped in usual sterile fashion. Local anesthesia was infiltrated midway between the cephalic and radial artery anatomically. A longitudinal skin incision was then made in this location at the distal left forearm. The incision was carried into the subcutaneous tissues down to level cephalic vein. The vein had some spasm but was overall reasonable quality accepting a 3 mm dilator. The vein was dissected free circumferentially and small side branches ligated and divided between silk ties. The distal end was ligated and the vein probed and found to accept up to a 3 mm dilator. This was gently distended with heparinized saline, spatulated, and marked for orientation. Next the radial artery was dissected free in the medial portion incision. The artery was 2.5 mm in diameter but had a reasonable pulse. It was moderately calcified. The vessel loops were placed proximal and distal to the planned site of arteriotomy. The patient was given 5000 units of intravenous heparin. After appropriate circulation time, the vessel loops were used to control the artery. A longitudinal opening was made in the left radial artery. The vein was controlled proximally with a fine bulldog clamp. The vein was then swung over to the artery and sewn end of vein to side of artery using a running 7-0 Prolene suture. Just prior to completion, the anastomosis was fore bled back bled and thoroughly flushed. The anastomosis was secured, vessel loops released, and there was a palpable thrill in the fistula immediately. After hemostasis was obtained, the subcutaneous tissues were reapproximated using a running 3-0 Vicryl suture. The skin was then closed with a 4  Vicryl subcuticular stitch. Dermabond was applied to the skin incision. The patient tolerated the procedure well and there were no complications. Instrument sponge and needle count were correct at the end of the case. The patient was taken to PACU in stable condition.   Ruta Hinds, MD  [facility] [place] [facility]  Office: [phone]  Pager: [phone]

## 2018-08-02 NOTE — H&P (Signed)
HPI: Wesley [name] is a 57 y.o. male, who is here today for discussion of access for hemodialysis.  Currently is undergoing dialysis via catheter placed at the time of his admission with acute renal failure at [facility].  He has done well with this access.  He is here today for discussion of permanent access.  He reports that he is essentially ambidextrous.  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  Does have diabetes as the presumed reason for his renal failure.  He is anemic related to his end-stage renal disease as well      Past Medical History:  Diagnosis Date  . Anasarca   . Anemia   . Cellulitis   . Diabetes mellitus without complication ([place])   . Hypertension   . Renal disorder          Family History  Problem Relation Age of Onset  . Cancer - Lung Father   . Congestive Heart Failure Mother   . Diabetes Mellitus II Mother   . Cervical cancer Mother   . Dementia Mother     SOCIAL HISTORY: Social History        Socioeconomic History  . Marital status: Married    Spouse name: Not on file  . Number of children: Not on file  . Years of education: Not on file  . Highest education level: Not on file  Occupational History  . Not on file  Social Needs  . Financial resource strain: Not on file  . Food insecurity:    Worry: Not on file    Inability: Not on file  . Transportation needs:    Medical: Not on file    Non-medical: Not on file  Tobacco Use  . Smoking status: Never Smoker  . Smokeless tobacco: Never Used  Substance and Sexual Activity  . Alcohol use: Not Currently    Alcohol/week: 0.0 standard drinks    Comment: rarely  . Drug use: No  . Sexual activity: Not on file  Lifestyle  . Physical activity:    Days per week: Not on file    Minutes per session: Not on file  . Stress: Not on file  Relationships  . Social connections:    Talks on phone: Not on file    Gets  together: Not on file    Attends religious service: Not on file    Active member of club or organization: Not on file    Attends meetings of clubs or organizations: Not on file    Relationship status: Not on file  . Intimate partner violence:    Fear of current or ex partner: Not on file    Emotionally abused: Not on file    Physically abused: Not on file    Forced sexual activity: Not on file  Other Topics Concern  . Not on file  Social History Narrative  . Not on file    No Known Allergies        Current Outpatient Medications  Medication Sig Dispense Refill  . calcitRIOL (ROCALTROL) 0.5 MCG capsule Take 1 capsule (0.5 mcg total) by mouth daily. 30 capsule 2  . calcium acetate (PHOSLO) 667 MG capsule Take 2 capsules (1,334 mg total) by mouth 3 (three) times daily with meals. 180 capsule 2  . calcium acetate (PHOSLO) 667 MG capsule Take 1 capsule (667 mg total) by mouth with snacks. 30 capsule 2  . FERREX 150 150  MG capsule Take 150 mg by mouth daily.  5  . Omega-3 Fatty Acids (FISH OIL PO) Take 1 tablet by mouth daily.    Marland Kitchen PROLENSA 0.07 % SOLN INSTILL 1 DROP INTO LEFT EYE DAILY, START 5 DAYS PRIOR TO SURGERY AND CONTINUE UNTIL GONE  0  . torsemide (DEMADEX) 20 MG tablet Take 3 tablets (60 mg total) by mouth daily. 90 tablet 0   No current facility-administered medications for this visit.     REVIEW OF SYSTEMS:  [X]  denotes positive finding, [ ]  denotes negative finding Cardiac  Comments:  Chest pain or chest pressure:    Shortness of breath upon exertion: x   Short of breath when lying flat:    Irregular heart rhythm:        Vascular    Pain in calf, thigh, or hip brought on by ambulation:    Pain in feet at night that wakes you up from your sleep:     Blood clot in your veins:    Leg swelling:  x       Pulmonary    Oxygen at home:    Productive cough:     Wheezing:         Neurologic    Sudden  weakness in arms or legs:     Sudden numbness in arms or legs:     Sudden onset of difficulty speaking or slurred speech:    Temporary loss of vision in one eye:     Problems with dizziness:         Gastrointestinal    Blood in stool:     Vomited blood:         Genitourinary    Burning when urinating:     Blood in urine:        Psychiatric    Major depression:         Hematologic    Bleeding problems:    Problems with blood clotting too easily:        Skin    Rashes or ulcers:        Constitutional    Fever or chills:      PHYSICAL EXAM:  Vitals:   08/02/18 0553 08/02/18 0557  BP: (!) 138/98   Pulse: 91   Resp: 20   Temp: 98.3 F (36.8 C)   TempSrc: Oral   SpO2: 98%   Weight:  117.9 kg  Height:  5\' 10"  (1.778 m)   GENERAL: The patient is a well-nourished male, in no acute distress. The vital signs are documented above. CARDIOVASCULAR: 2+ radial pulses bilaterally.  Large cephalic veins palpable at the wrist and antecubital space bilaterally PULMONARY: There is good air exchange  ABDOMEN: Soft and non-tender  MUSCULOSKELETAL: There are no major deformities or cyanosis. NEUROLOGIC: No focal weakness or paresthesias are detected. SKIN: There are no ulcers or rashes noted.  Swelling and hemosiderin deposits circumferentially at his legs PSYCHIATRIC: The patient has a normal affect.  DATA:  Bilateral upper extremity arterial and venous studies were obtained at [facility] today and were reviewed with the patient.  This reveals a large diameter cephalic vein from the wrist proximally bilaterally.  Arterial flow bilateral.  Assessment: Needs HD access   Plan: left radial cephalic AVF today.  Risks and benefits discussed including but not limited to bleeding infection numbness in hand non maturation of fistula other possible revisions he wishes to proceeed  Ruta Hinds, MD [facility] Office: [phone] Pager: [phone]

## 2018-08-02 NOTE — Discharge Instructions (Signed)
° °  [facility]  Discharge Instructions  AV Fistula or Graft Surgery for Dialysis Access  Please refer to the following instructions for your post-procedure care. Your surgeon or physician assistant will discuss any changes with you.  Activity  You may drive the day following your surgery, if you are comfortable and no longer taking prescription pain medication. Resume full activity as the soreness in your incision resolves.  Bathing/Showering  You may shower after you go home. Keep your incision dry for 48 hours. Do not soak in a bathtub, hot tub, or swim until the incision heals completely. You may not shower if you have a hemodialysis catheter.  Incision Care  Clean your incision with mild soap and water after 48 hours. Pat the area dry with a clean towel. You do not need a bandage unless otherwise instructed. Do not apply any ointments or creams to your incision. You may have skin glue on your incision. Do not peel it off. It will come off on its own in about one week. Your arm may swell a bit after surgery. To reduce swelling use pillows to elevate your arm so it is above your heart. Your doctor will tell you if you need to lightly wrap your arm with an ACE bandage.  Diet  Resume your normal diet. There are not special food restrictions following this procedure. In order to heal from your surgery, it is CRITICAL to get adequate nutrition. Your body requires vitamins, minerals, and protein. Vegetables are the best source of vitamins and minerals. Vegetables also provide the perfect balance of protein. Processed food has little nutritional value, so try to avoid this.  Medications  Resume taking all of your medications. If your incision is causing pain, you may take over-the counter pain relievers such as acetaminophen (Tylenol). If you were prescribed a stronger pain medication, please be aware these medications can cause nausea and constipation. Prevent  nausea by taking the medication with a snack or meal. Avoid constipation by drinking plenty of fluids and eating foods with high amount of fiber, such as fruits, vegetables, and grains.  Do not take Tylenol if you are taking prescription pain medications.  Follow up Your surgeon may want to see you in the office following your access surgery. If so, this will be arranged at the time of your surgery.  Please call us immediately for any of the following conditions:  Increased pain, redness, drainage (pus) from your incision site Fever of 101 degrees or higher Severe or worsening pain at your incision site Hand pain or numbness.  Reduce your risk of vascular disease:  Stop smoking. If you would like help, call [facility] at 1-800-QUIT-NOW 910-684-0463) or [facility] [place] your cholesterol Maintain a desired weight Control your diabetes Keep your blood pressure down  Dialysis  It will take several weeks to several months for your new dialysis access to be ready for use. Your surgeon will determine when it is okay to use it. Your nephrologist will continue to direct your dialysis. You can continue to use your Permcath until your new access is ready for use.   08/02/2018 Wesley [name] [id number] June 21, 1961  Surgeon(s): Fields, Jessy Oto, MD  Procedure(s): CREATION RADIOCEPHALIC ARTERIOVENOUS FISTULA LEFT ARM  x Do not stick fistula for 12 weeks    If you have any questions, please call the office at [phone].

## 2018-08-03 ENCOUNTER — Encounter (HOSPITAL_COMMUNITY): Payer: Self-pay | Admitting: Vascular Surgery

## 2018-08-03 ENCOUNTER — Telehealth: Payer: Self-pay | Admitting: Vascular Surgery

## 2018-08-03 NOTE — Telephone Encounter (Signed)
-----   Message from Mena Goes, RN sent at 08/02/2018  8:59 AM EST ----- Regarding: 6 weeks with duplex   ----- Message ----- From: Elam Dutch, MD Sent: 08/02/2018   8:54 AM EST To: V[facility] Charge Pool  Left radial cephalic AVF Can follow up in [place] clinic 6 weeks with [place]  Charles Fields

## 2018-08-03 NOTE — Telephone Encounter (Signed)
sch appt spk to pt mld ltr 09/15/18 1pm Dialysis Duplex 2pm p/o PA

## 2018-08-09 ENCOUNTER — Other Ambulatory Visit: Payer: Self-pay

## 2018-08-09 DIAGNOSIS — Z992 Dependence on renal dialysis: Principal | ICD-10-CM

## 2018-08-09 DIAGNOSIS — N186 End stage renal disease: Secondary | ICD-10-CM

## 2018-08-16 ENCOUNTER — Ambulatory Visit (INDEPENDENT_AMBULATORY_CARE_PROVIDER_SITE_OTHER): Payer: Commercial Managed Care - PPO | Admitting: Student

## 2018-08-16 ENCOUNTER — Encounter: Payer: Self-pay | Admitting: Student

## 2018-08-16 VITALS — BP 140/78 | HR 80 | Ht 70.0 in | Wt 277.0 lb

## 2018-08-16 DIAGNOSIS — I2510 Atherosclerotic heart disease of native coronary artery without angina pectoris: Secondary | ICD-10-CM

## 2018-08-16 DIAGNOSIS — I5042 Chronic combined systolic (congestive) and diastolic (congestive) heart failure: Secondary | ICD-10-CM | POA: Diagnosis not present

## 2018-08-16 DIAGNOSIS — I10 Essential (primary) hypertension: Secondary | ICD-10-CM

## 2018-08-16 DIAGNOSIS — E785 Hyperlipidemia, unspecified: Secondary | ICD-10-CM

## 2018-08-16 DIAGNOSIS — I4819 Other persistent atrial fibrillation: Secondary | ICD-10-CM

## 2018-08-16 DIAGNOSIS — D649 Anemia, unspecified: Secondary | ICD-10-CM

## 2018-08-16 DIAGNOSIS — N186 End stage renal disease: Secondary | ICD-10-CM

## 2018-08-16 MED ORDER — METOPROLOL SUCCINATE ER 50 MG PO TB24: 50.0000 mg | ORAL_TABLET | Freq: Every day | ORAL | 3 refills | Status: DC

## 2018-08-16 NOTE — Progress Notes (Signed)
Cardiology Office Note    Date:  08/16/2018   ID:  Wesley [name], DOB [birth year], MRN [id number]  PCP:  Arsenio Katz, NP  Cardiologist: Kate Sable, MD    Chief Complaint  Patient presents with  . Follow-up    6 week visit    History of Present Illness:    Wesley [name] is a 57 y.o. male with past medical history of chronic combined systolic and diastolic CHF (EF 84-13% by echo in 06/28/2018), CAD (cath in 06/2018 showing moderate to severe CAD --> medical management recommended at that time), PAF, HTN, HLD, ESRD, and chronic anemia who presents to the office today for 6-week follow-up.  He was last examined by myself on 06/22/2018 for preoperative cardiac clearance in regards to upcoming AV fistula placement. Troponin values had recently peaked at 8.12 during an admission but he declined further evaluation at that time. He was in agreement to undergo an echocardiogram and stress testing. Also at the time of his visit, he was noted to be in new-onset atrial fibrillation with HR in the low-100's to 120's. He was started on Lopressor 25mg  BID for rate-control and was not started on anticoagulation given his anemia.   An echocardiogram was obtained on 06/28/2018 and showed his EF was reduced at 20-25% with diffuse HK. Therefore, his NST was cancelled and a cardiac catheterization was recommended for further evaluation. This was performed by Dr. Irish Lack on 07/05/2018 and showed 80% D1 stenosis, 70% Proximal-LAD, 50% mid-RCA, 80% mid to distal-RCA, 100% distal RCA with left to right collaterals, and moderate pulmonary HTN. Continued medical therapy was recommended at that time given no recent anginal symptoms and in the setting of medication noncompliance and upcoming fistula placement. Not felt to be an ideal CABG candidate given his renal dysfunction and cardiomyopathy but could consider PCI if he demonstrates medical compliance.   He did undergo fistula placement by  Dr. Oneida Alar on 08/02/2018 and no immediate complications were noted.  In talking with the patient today, he reports overall doing well from a cardiac perspective since his last office visit. He does report having frequent fatigue due to his dialysis schedule but denies any recent chest pain or dyspnea on exertion. No recent orthopnea, PND, or lower extremity edema. He has been trying to stay active and reports going hiking last week without any anginal symptoms.  He tells me that he has been trying to make dietary changes since his recent cardiac catheterization but also says he is consuming gravy multiple times per week. Notes being under increased stress given all of the medical issues he has faced over the past few months.    Past Medical History:  Diagnosis Date  . Anasarca   . Anemia   . Atrial fibrillation ([place])    diagnosed 05/2018  . Cellulitis   . Coronary artery disease    a. cath in 06/2018 showing 80% D1 stenosis, 70% Proximal-LAD, 50% mid-RCA, 80% mid to distal-RCA, 100% distal RCA with left to right collaterals, and moderate pulmonary HTN. Medical management recommended given upcoming surgery and no recent angina.   . Diabetes mellitus without complication ([place])   . Hypertension   . LV dysfunction    LVEF 20-25% 06/28/18 echo  . Renal disorder     Past Surgical History:  Procedure Laterality Date  . AV FISTULA PLACEMENT Left 08/02/2018   Procedure: CREATION RADIOCEPHALIC ARTERIOVENOUS FISTULA LEFT ARM;  Surgeon: Elam Dutch, MD;  Location: [facility];  Service:  Vascular;  Laterality: Left;  . CATARACT EXTRACTION W/ INTRAOCULAR LENS  IMPLANT, BILATERAL    . FOOT SURGERY     right  . INCISION AND DRAINAGE OF WOUND  04/03/2012   Procedure: IRRIGATION AND DEBRIDEMENT WOUND;  Surgeon: Scherry Ran, MD;  Location: [facility];  Service: General;  Laterality: Right;  . IR FLUORO GUIDE CV LINE RIGHT  05/13/2018  . IR US GUIDE VASC ACCESS RIGHT  05/13/2018  . RIGHT/LEFT HEART CATH AND  CORONARY ANGIOGRAPHY N/A 07/05/2018   Procedure: RIGHT/LEFT HEART CATH AND CORONARY ANGIOGRAPHY;  Surgeon: Jettie Booze, MD;  Location: [facility];  Service: Cardiovascular;  Laterality: N/A;    Current Medications: Outpatient Medications Prior to Visit  Medication Sig Dispense Refill  . aspirin EC 81 MG tablet Take 1 tablet (81 mg total) by mouth daily. 90 tablet 3  . FERREX 150 150 MG capsule Take 150 mg by mouth daily.   5  . Omega-3 Fatty Acids (FISH OIL) 1000 MG CAPS Take 1,000 mg by mouth every evening.     . sevelamer carbonate (RENVELA) 800 MG tablet Take 1,600 mg by mouth 2 (two) times daily with breakfast and lunch.     . torsemide (DEMADEX) 20 MG tablet Take 3 tablets (60 mg total) by mouth daily. 90 tablet 0  . metoprolol succinate (TOPROL XL) 25 MG 24 hr tablet Take 1 tablet (25 mg total) by mouth daily. 30 tablet 11  . metoprolol tartrate (LOPRESSOR) 25 MG tablet Take 25 mg by mouth 2 (two) times daily.    Marland Kitchen oxyCODONE-acetaminophen (PERCOCET) 5-325 MG tablet Take 1 tablet by mouth every 6 (six) hours as needed for severe pain. 8 tablet 0   No facility-administered medications prior to visit.      Allergies:   Patient has no known allergies.   Social History   Socioeconomic History  . Marital status: Married    Spouse name: Not on file  . Number of children: Not on file  . Years of education: Not on file  . Highest education level: Not on file  Occupational History  . Not on file  Social Needs  . Financial resource strain: Not on file  . Food insecurity:    Worry: Not on file    Inability: Not on file  . Transportation needs:    Medical: Not on file    Non-medical: Not on file  Tobacco Use  . Smoking status: Never Smoker  . Smokeless tobacco: Never Used  Substance and Sexual Activity  . Alcohol use: Yes    Alcohol/week: 0.0 standard drinks    Comment: rarely  . Drug use: No  . Sexual activity: Not on file  Lifestyle  . Physical activity:     Days per week: Not on file    Minutes per session: Not on file  . Stress: Not on file  Relationships  . Social connections:    Talks on phone: Not on file    Gets together: Not on file    Attends religious service: Not on file    Active member of club or organization: Not on file    Attends meetings of clubs or organizations: Not on file    Relationship status: Not on file  Other Topics Concern  . Not on file  Social History Narrative  . Not on file     Family History:  The patient's family history includes Cancer - Lung in his father; Cervical cancer in his mother;  Congestive Heart Failure in his mother; Dementia in his mother; Diabetes Mellitus II in his mother.   Review of Systems:   Please see the history of present illness.     General:  No chills, fever, night sweats or weight changes. Positive for fatigue.  Cardiovascular:  No chest pain, dyspnea on exertion, orthopnea, palpitations, paroxysmal nocturnal dyspnea. Positive for edema (improved).  Dermatological: No rash, lesions/masses Respiratory: No cough, dyspnea Urologic: No hematuria, dysuria Abdominal:   No nausea, vomiting, diarrhea, bright red blood per rectum, melena, or hematemesis Neurologic:  No visual changes, wkns, changes in mental status. All other systems reviewed and are otherwise negative except as noted above.   Physical Exam:    VS:  BP 140/78   Pulse 80   Ht 5\' 10"  (1.778 m)   Wt 277 lb (125.6 kg)   SpO2 96%   BMI 39.75 kg/m    General: Well developed, well nourished Caucasian male appearing in no acute distress. Head: Normocephalic, atraumatic, sclera non-icteric, no xanthomas, nares are without discharge.  Neck: No carotid bruits. JVD not elevated.  Lungs: Respirations regular and unlabored, without wheezes or rales.  Heart: Irregularly irregular. No S3 or S4.  No murmur, no rubs, or gallops appreciated. Abdomen: Soft, non-tender, non-distended with normoactive bowel sounds. No hepatomegaly.  No rebound/guarding. No obvious abdominal masses. Msk:  Strength and tone appear normal for age. No joint deformities or effusions. Extremities: No clubbing or cyanosis. Trace ankle edema bilaterally.  Distal pedal pulses are 2+ bilaterally. Neuro: Alert and oriented X 3. Moves all extremities spontaneously. No focal deficits noted. Psych:  Responds to questions appropriately with a normal affect. Skin: No rashes or lesions noted  Wt Readings from Last 3 Encounters:  08/16/18 277 lb (125.6 kg)  08/02/18 260 lb (117.9 kg)  07/05/18 255 lb (115.7 kg)     Studies/Labs Reviewed:   EKG:  EKG is not ordered today.    Recent Labs: 05/12/2018: ALT 37 05/14/2018: Magnesium 2.4 07/05/2018: BUN 41; Creatinine, Ser 5.54; Platelets 212 08/02/2018: Hemoglobin 9.5; Potassium 4.5; Sodium 140   Lipid Panel    Component Value Date/Time   CHOL 127 09/13/2014 0520   TRIG 102 09/13/2014 0520   HDL 30 (L) 09/13/2014 0520   CHOLHDL 4.2 09/13/2014 0520   VLDL 20 09/13/2014 0520   LDLCALC 77 09/13/2014 0520    Additional studies/ records that were reviewed today include:   Echocardiogram: 06/28/2018 Study Conclusions  - Left ventricle: The cavity size was normal. Wall thickness was   increased in a pattern of moderate LVH. Systolic function was   severely reduced. The estimated ejection fraction was in the   range of 20% to 25%. Diffuse hypokinesis. The study is not   technically sufficient to allow evaluation of LV diastolic   function. - Aortic valve: Mildly calcified annulus. Mildly thickened   leaflets. Valve area (VTI): 3.22 cm^2. Valve area (Vmax): 3.53   cm^2. - Mitral valve: Mildly calcified annulus. Normal thickness leaflets   . - Left atrium: The atrium was moderately dilated. - Right ventricle: The cavity size was mildly dilated. Systolic   function was mildly reduced. - Right atrium: The atrium was mildly dilated. - Technically difficult study, echocontrast was used to enhance    visualization.  Cardiac Catheterization: 07/05/2018  Ost 1st Diag to 1st Diag lesion is 80% stenosed.  Prox LAD to Mid LAD lesion is 70% stenosed.  1st Mrg lesion is 70% stenosed.  Mid RCA lesion is 50% stenosed.  Mid RCA to Dist RCA lesion is 80% stenosed.  Dist RCA lesion is 100% stenosed. Left to right collaterals.  LV end diastolic pressure is mildly elevated.  There is no aortic valve stenosis.  Hemodynamic findings consistent with moderate pulmonary hypertension.  PA 53/28, mean PA 38 mm Hg; PCWP mean 28 mm Hg; CO 6.2 L/min; CI 2.7. PA sat 58%.   Significant CAD.  No anginal sx.  No critical lesions noted.  Would continue medical therapy for decreased LVEF.  Compliance will have to be shown with his meds.  He is currently not taking his metoprolol per his preference.  After AV fistula creation, could reconsider cath and then committing him to DAPT if PCI needed.    He is not an ideal candidate for CABG due to his renal dysfunction, low EF.  This could also be considered.  Will discuss with Dr. Bronson Ing.  Recommend Aspirin 81mg  daily for moderate CAD.   Assessment:    1. Chronic combined systolic and diastolic heart failure ([place])   2. Coronary artery disease involving native coronary artery of native heart without angina pectoris   3. Persistent atrial fibrillation   4. Essential hypertension   5. Hyperlipidemia LDL goal <70   6. ESRD (end stage renal disease) ([place])   7. Anemia, unspecified type      Plan:   In order of problems listed above:  1. Chronic Combined Systolic and Diastolic CHF - He has a known reduced EF of 20 to 25% by echocardiogram in 06/2018 which is likely due to a mixed cardiomyopathy in the setting of his CAD and tachycardia with atrial fibrillation. - Volume status is being managed by hemodialysis and he reports they are trying to figure out his dry weight. He has remained on Torsemide 60 mg daily per Nephrology due to him still producing  urine. Will defer diuretic dose adjustment to Nephrology. - He has continued on Lopressor 25 mg twice daily instead of switching to Toprol-XL as previously discussed. Therefore, recommendations for this were again reviewed with the patient today and he is in agreement to switch to Toprol-XL in setting of his cardiomyopathy. Unable to add ACE-I/ARB/ARNI given his ESRD. Could perhaps consider adding Imdur or Hydralazine in the future but he is very hesitant to undergo any medication changes as outlined below which limits his medical therapy. Sodium and fluid restriction reviewed.   2. CAD - recent catheterization showed 80% D1 stenosis, 70% Proximal-LAD, 50% mid-RCA, 80% mid to distal-RCA, 100% distal RCA with left to right collaterals, and moderate pulmonary HTN. Continued medical therapy was recommended at that time given no recent anginal symptoms and in the setting of medication noncompliance and upcoming fistula placement.  - he denies any recent dyspnea or chest pain. Cath report reviewed in detail with the patient. - will continue on ASA and BB therapy. We spent over 15 minutes reviewing the role of cholesterol in CAD and the importance of statin therapy but he declines to be on a statin at this time. I informed the patient that we could consider Crestor 5 mg once weekly initially or Zetia but he still declines this. Therefore, remains on OTC Fish Oil.   3. Persistent Atrial Fibrillation - Heart rate initially elevated into the 120's with him walking into the room but improving to the 80's with rest. He reports that his HR has been well controlled when checked at home and during HD. Will plan to switch Lopressor to Toprol-XL as outlined above. This can  be further titrated at his next office visit pending HR and BP response.  - not on anticoagulation given his noncompliance and anemia. We reviewed the indication for anticoagulation given his increased thromboembolic risk but he states he never wishes  to be on blood thinners. Patient aware of increased risk of a thromboembolic event. Remains on ASA 81mg  daily.   4. HTN - BP is well controlled at 140/78 during today's visit. Plan to switch to Toprol-XL as outlined above which can be further titrated pending HR and BP response.   5. HLD - as outlined in #2, he has declined statin therapy or Zetia. Remains on Fish Oil. Reports having recent labs with his PCP and will request these records. Dietary modification strongly recommended.   6. ESRD - on HD - MWF schedule.   7. Anemia - Hgb stable at 9.5 when checked on 08/02/2018. He denies any evidence of active bleeding. Followed by Nephrology.    Medication Adjustments/Labs and Tests Ordered: Current medicines are reviewed at length with the patient today.  Concerns regarding medicines are outlined above.  Medication changes, Labs and Tests ordered today are listed in the Patient Instructions below. Patient Instructions  Medication Instructions:  Your physician has recommended you make the following change in your medication:  Stop Taking Lopressor  Start Taking Toprol XL 50 Daily   If you need a refill on your cardiac medications before your next appointment, please call your pharmacy.   Lab work: NONE   If you have labs (blood work) drawn today and your tests are completely normal, you will receive your results only by: Marland Kitchen MyChart Message (if you have MyChart) OR . A paper copy in the mail If you have any lab test that is abnormal or we need to change your treatment, we will call you to review the results.  Testing/Procedures: NONE   Follow-Up: At [facility], you and your health needs are our priority.  As part of our continuing mission to provide you with exceptional heart care, we have created designated Provider Care Teams.  These Care Teams include your primary Cardiologist (physician) and Advanced Practice Providers (APPs -  Physician Assistants and Nurse Practitioners) who  all work together to provide you with the care you need, when you need it. You will need a follow up appointment in 6-8  weeks.  Please call our office 2 months in advance to schedule this appointment.  You may see Kate Sable, MD or one of the following Advanced Practice Providers on your designated Care Team:   Bernerd Pho, PA-C [facility]) . Ermalinda Barrios, PA-C ([place])  Any Other Special Instructions Will Be Listed Below (If Applicable). Thank you for choosing [facility]!     Cholesterol Cholesterol is a white, waxy, fat-like substance that is needed by the human body in small amounts. The liver makes all the cholesterol we need. Cholesterol is carried from the liver by the blood through the blood vessels. Deposits of cholesterol (plaques) may build up on blood vessel (artery) walls. Plaques make the arteries narrower and stiffer. Cholesterol plaques increase the risk for heart attack and stroke. You cannot feel your cholesterol level even if it is very high. The only way to know that it is high is to have a blood test. Once you know your cholesterol levels, you should keep a record of the test results. Work with your health care provider to keep your levels in the desired range. What do the results mean?  Total cholesterol is a rough measure of all the cholesterol in your blood.  LDL (low-density lipoprotein) is the "bad" cholesterol. This is the type that causes plaque to build up on the artery walls. You want this level to be low.  HDL (high-density lipoprotein) is the "good" cholesterol because it cleans the arteries and carries the LDL away. You want this level to be high.  Triglycerides are fat that the body can either burn for energy or store. High levels are closely linked to heart disease. What are the desired levels of cholesterol?  Total cholesterol below 200.  LDL below 100 for people who are at risk, below 70 for people at very high  risk.  HDL above 40 is good. A level of 60 or higher is considered to be protective against heart disease.  Triglycerides below 150. How can I lower my cholesterol? Diet Follow your diet program as told by your health care provider.  Choose fish or white meat chicken and [place], roasted or baked. Limit fatty cuts of red meat, fried foods, and processed meats, such as sausage and lunch meats.  Eat lots of fresh fruits and vegetables.  Choose whole grains, beans, pasta, potatoes, and cereals.  Choose olive oil, corn oil, or canola oil, and use only small amounts.  Avoid butter, mayonnaise, shortening, or palm kernel oils.  Avoid foods with trans fats.  Drink skim or nonfat milk and eat low-fat or nonfat yogurt and cheeses. Avoid whole milk, cream, ice cream, egg yolks, and full-fat cheeses.  Healthier desserts include angel food cake, ginger snaps, animal crackers, hard candy, popsicles, and low-fat or nonfat frozen yogurt. Avoid pastries, cakes, pies, and cookies.  Exercise  Follow your exercise program as told by your health care provider. A regular program: ? Helps to decrease LDL and raise HDL. ? Helps with weight control.  Do things that increase your activity level, such as gardening, walking, and taking the stairs.  Ask your health care provider about ways that you can be more active in your daily life.  Medicine  Take over-the-counter and prescription medicines only as told by your health care provider. ? Medicine may be prescribed by your health care provider to help lower cholesterol and decrease the risk for heart disease. This is usually done if diet and exercise have failed to bring down cholesterol levels. ? If you have several risk factors, you may need medicine even if your levels are normal.  This information is not intended to replace advice given to you by your health care provider. Make sure you discuss any questions you have with your health care  provider. Document Released: 06/09/2001 Document Revised: 04/11/2016 Document Reviewed: 03/14/2016 Elsevier Interactive Patient Education  2018 [place], Erma Heritage, [place]  08/16/2018 5:38 PM    [place]. [address] [place], [place] [address] Phone: [phone]

## 2018-08-16 NOTE — Patient Instructions (Addendum)
Medication Instructions:  Your physician has recommended you make the following change in your medication:  Stop Taking Lopressor  Start Taking Toprol XL 50 Daily   If you need a refill on your cardiac medications before your next appointment, please call your pharmacy.   Lab work: NONE   If you have labs (blood work) drawn today and your tests are completely normal, you will receive your results only by: Marland Kitchen MyChart Message (if you have MyChart) OR . A paper copy in the mail If you have any lab test that is abnormal or we need to change your treatment, we will call you to review the results.  Testing/Procedures: NONE   Follow-Up: At [facility], you and your health needs are our priority.  As part of our continuing mission to provide you with exceptional heart care, we have created designated Provider Care Teams.  These Care Teams include your primary Cardiologist (physician) and Advanced Practice Providers (APPs -  Physician Assistants and Nurse Practitioners) who all work together to provide you with the care you need, when you need it. You will need a follow up appointment in 6-8  weeks.  Please call our office 2 months in advance to schedule this appointment.  You may see Kate Sable, MD or one of the following Advanced Practice Providers on your designated Care Team:   Bernerd Pho, PA-C [facility]) . Ermalinda Barrios, PA-C ([place])  Any Other Special Instructions Will Be Listed Below (If Applicable). Thank you for choosing [facility]!     Cholesterol Cholesterol is a white, waxy, fat-like substance that is needed by the human body in small amounts. The liver makes all the cholesterol we need. Cholesterol is carried from the liver by the blood through the blood vessels. Deposits of cholesterol (plaques) may build up on blood vessel (artery) walls. Plaques make the arteries narrower and stiffer. Cholesterol plaques increase the risk for heart  attack and stroke. You cannot feel your cholesterol level even if it is very high. The only way to know that it is high is to have a blood test. Once you know your cholesterol levels, you should keep a record of the test results. Work with your health care provider to keep your levels in the desired range. What do the results mean?  Total cholesterol is a rough measure of all the cholesterol in your blood.  LDL (low-density lipoprotein) is the "bad" cholesterol. This is the type that causes plaque to build up on the artery walls. You want this level to be low.  HDL (high-density lipoprotein) is the "good" cholesterol because it cleans the arteries and carries the LDL away. You want this level to be high.  Triglycerides are fat that the body can either burn for energy or store. High levels are closely linked to heart disease. What are the desired levels of cholesterol?  Total cholesterol below 200.  LDL below 100 for people who are at risk, below 70 for people at very high risk.  HDL above 40 is good. A level of 60 or higher is considered to be protective against heart disease.  Triglycerides below 150. How can I lower my cholesterol? Diet Follow your diet program as told by your health care provider.  Choose fish or white meat chicken and [place], roasted or baked. Limit fatty cuts of red meat, fried foods, and processed meats, such as sausage and lunch meats.  Eat lots of fresh fruits and vegetables.  Choose whole grains, beans,  pasta, potatoes, and cereals.  Choose olive oil, corn oil, or canola oil, and use only small amounts.  Avoid butter, mayonnaise, shortening, or palm kernel oils.  Avoid foods with trans fats.  Drink skim or nonfat milk and eat low-fat or nonfat yogurt and cheeses. Avoid whole milk, cream, ice cream, egg yolks, and full-fat cheeses.  Healthier desserts include angel food cake, ginger snaps, animal crackers, hard candy, popsicles, and low-fat or nonfat  frozen yogurt. Avoid pastries, cakes, pies, and cookies.  Exercise  Follow your exercise program as told by your health care provider. A regular program: ? Helps to decrease LDL and raise HDL. ? Helps with weight control.  Do things that increase your activity level, such as gardening, walking, and taking the stairs.  Ask your health care provider about ways that you can be more active in your daily life.  Medicine  Take over-the-counter and prescription medicines only as told by your health care provider. ? Medicine may be prescribed by your health care provider to help lower cholesterol and decrease the risk for heart disease. This is usually done if diet and exercise have failed to bring down cholesterol levels. ? If you have several risk factors, you may need medicine even if your levels are normal.  This information is not intended to replace advice given to you by your health care provider. Make sure you discuss any questions you have with your health care provider. Document Released: 06/09/2001 Document Revised: 04/11/2016 Document Reviewed: 03/14/2016 Elsevier Interactive Patient Education  [employer].

## 2018-08-26 MED FILL — Perflutren Lipid Microsphere IV Susp 1.1 MG/ML: INTRAVENOUS | Qty: 10 | Status: AC

## 2018-09-15 ENCOUNTER — Ambulatory Visit (HOSPITAL_COMMUNITY)
Admission: RE | Admit: 2018-09-15 | Discharge: 2018-09-15 | Disposition: A | Discharge status: Home / Self Care | Payer: Commercial Managed Care - PPO | Source: Ambulatory Visit | Attending: Family | Admitting: Family

## 2018-09-15 ENCOUNTER — Other Ambulatory Visit: Payer: Self-pay

## 2018-09-15 ENCOUNTER — Ambulatory Visit (INDEPENDENT_AMBULATORY_CARE_PROVIDER_SITE_OTHER): Payer: Self-pay | Admitting: Physician Assistant

## 2018-09-15 VITALS — BP 126/75 | HR 87 | Temp 98.0°F | Resp 20 | Ht 70.0 in | Wt 277.0 lb

## 2018-09-15 DIAGNOSIS — Z992 Dependence on renal dialysis: Secondary | ICD-10-CM

## 2018-09-15 DIAGNOSIS — N186 End stage renal disease: Secondary | ICD-10-CM

## 2018-09-15 NOTE — Progress Notes (Signed)
  POST OPERATIVE OFFICE NOTE    CC:  F/u for surgery  HPI:  This is a 57 y.o. male who is s/p Left Radial Cephalic AV fistula 63/11/3543.  He is here today for follow up exam and fistula duplex to check for maturity of the fistula.  He denise pain, loss of motor or numbness.     No Known Allergies  Current Outpatient Medications  Medication Sig Dispense Refill  . aspirin EC 81 MG tablet Take 1 tablet (81 mg total) by mouth daily. 90 tablet 3  . FERREX 150 150 MG capsule Take 150 mg by mouth daily.   5  . metoprolol succinate (TOPROL-XL) 50 MG 24 hr tablet Take 1 tablet (50 mg total) by mouth daily. Take with or immediately following a meal. 90 tablet 3  . Omega-3 Fatty Acids (FISH OIL) 1000 MG CAPS Take 1,000 mg by mouth every evening.     . sevelamer carbonate (RENVELA) 800 MG tablet Take 1,600 mg by mouth 2 (two) times daily with breakfast and lunch.     . torsemide (DEMADEX) 20 MG tablet Take 3 tablets (60 mg total) by mouth daily. 90 tablet 0   No current facility-administered medications for this visit.      ROS:  See HPI  Physical Exam:  There were no vitals filed for this visit.  Incision:  Healing well with palpable thrill through out distal 1/2 the forearm.  Sensation and grip  Equal B UE.      +------------+----------+-------------+----------+--------+ OUTFLOW VEINPSV (cm/s)Diameter (cm)Depth (cm)Describe +------------+----------+-------------+----------+--------+ Prox Forearm   143        0.50        0.52            +------------+----------+-------------+----------+--------+ Mid Forearm    139        0.49        0.31            +------------+----------+-------------+----------+--------+ Dist Forearm   232        0.44        0.63            +------------+----------+-------------+----------+--------+       Summary: Patent left radio-cephalic AVF. The radial artery is retrograde distal to the anastomosis. There are multiple branches in  the distal and mid forearm measuring 0.356cm, 0.393cm, 0.215cm.  Assessment/Plan:  This is a 57 y.o. male who is s/p: left forearm av fistula RC creation.  The fistula is maturing, but it needs to be at least 0.6 cm or larger to access.  He is on HD via right IJ TDC.  He does not want another surgery if he can help it.  He is a Chief Executive Officer.    I scheduled him a return visit in 3 weeks for repeat duplex.  If the fistula does not increase in size we will plan branch ligation surgery or move up to a brachial cephalic fistula pending his next visit.       Roxy Horseman PA-C [facility] [phone]

## 2018-09-30 ENCOUNTER — Other Ambulatory Visit: Payer: Self-pay

## 2018-09-30 DIAGNOSIS — N186 End stage renal disease: Secondary | ICD-10-CM

## 2018-09-30 DIAGNOSIS — Z992 Dependence on renal dialysis: Principal | ICD-10-CM

## 2018-10-07 ENCOUNTER — Encounter: Payer: Self-pay | Admitting: Cardiovascular Disease

## 2018-10-07 ENCOUNTER — Ambulatory Visit (INDEPENDENT_AMBULATORY_CARE_PROVIDER_SITE_OTHER): Payer: Commercial Managed Care - PPO | Admitting: Cardiovascular Disease

## 2018-10-07 ENCOUNTER — Encounter

## 2018-10-07 VITALS — BP 140/66 | HR 66 | Ht 70.0 in | Wt 269.0 lb

## 2018-10-07 DIAGNOSIS — N186 End stage renal disease: Secondary | ICD-10-CM

## 2018-10-07 DIAGNOSIS — I4819 Other persistent atrial fibrillation: Secondary | ICD-10-CM | POA: Diagnosis not present

## 2018-10-07 DIAGNOSIS — I10 Essential (primary) hypertension: Secondary | ICD-10-CM

## 2018-10-07 DIAGNOSIS — I25118 Atherosclerotic heart disease of native coronary artery with other forms of angina pectoris: Secondary | ICD-10-CM | POA: Diagnosis not present

## 2018-10-07 DIAGNOSIS — I5042 Chronic combined systolic (congestive) and diastolic (congestive) heart failure: Secondary | ICD-10-CM | POA: Diagnosis not present

## 2018-10-07 DIAGNOSIS — E785 Hyperlipidemia, unspecified: Secondary | ICD-10-CM

## 2018-10-07 DIAGNOSIS — D649 Anemia, unspecified: Secondary | ICD-10-CM

## 2018-10-07 NOTE — Progress Notes (Signed)
SUBJECTIVE: The patient presents for routine follow up. He has a history of chronic combined systolic and diastolic heart failure, significant CAD, medication noncompliance, persistent atrial fibrillation, and end stage renal disease on hemodialysis.  He saw B. Strader PA-C on 08/16/2018.  I last saw him while he was hospitalized on 05/16/2018.  In summary, echocardiogram on 06/28/2018 demonstrated severely reduced left ventricular systolic function, LVEF 20 to 25%, with diffuse hypokinesis.  Cardiac catheterization was performed on 07/05/2018 and showed 80% D1 stenosis, 70% Proximal-LAD, 50% mid-RCA, 80% mid to distal-RCA, 100% distal RCA with left to right collaterals, and moderate pulmonary HTN. Continued medical therapy was recommended at that time given no recent anginal symptoms and in the setting of medication noncompliance and upcoming fistula placement. Not felt to be an ideal CABG candidate given his renal dysfunction and cardiomyopathy but could consider PCI if he demonstrates medical compliance.   He underwent fistula placement by vascular surgery on 08/02/2018.  He is known to be noncompliant with dietary recommendations as well and consumes a high sodium diet.  He said he feels reasonably well overall.  He denies chest pain.  Chronic exertional dyspnea is stable.  He denies palpitations.  He wants to lose weight.  He is trying to control portion sizes.  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  He said he does not have the vocal range that he once had last year.  He still makes urine but says the flow has considerably diminished since starting dialysis.    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   Review of Systems: As per "subjective", otherwise negative.  No Known Allergies  Current Outpatient Medications  Medication Sig Dispense Refill  . aspirin  EC 81 MG tablet Take 1 tablet (81 mg total) by mouth daily. 90 tablet 3  . FERREX 150 150 MG capsule Take 150 mg by mouth daily.   5  . metoprolol succinate (TOPROL-XL) 50 MG 24 hr tablet Take 1 tablet (50 mg total) by mouth daily. Take with or immediately following a meal. 90 tablet 3  . Omega-3 Fatty Acids (FISH OIL) 1000 MG CAPS Take 1,000 mg by mouth every evening.     . sevelamer carbonate (RENVELA) 800 MG tablet Take 1,600 mg by mouth 2 (two) times daily with breakfast and lunch.     . torsemide (DEMADEX) 20 MG tablet Take 3 tablets (60 mg total) by mouth daily. 90 tablet 0   No current facility-administered medications for this visit.     Past Medical History:  Diagnosis Date  . Anasarca   . Anemia   . Atrial fibrillation ([place])    diagnosed 05/2018  . Cellulitis   . Coronary artery disease    a. cath in 06/2018 showing 80% D1 stenosis, 70% Proximal-LAD, 50% mid-RCA, 80% mid to distal-RCA, 100% distal RCA with left to right collaterals, and moderate pulmonary HTN. Medical management recommended given upcoming surgery and no recent angina.   . Diabetes mellitus without complication ([place])   . Hypertension   . LV dysfunction    LVEF 20-25% 06/28/18 echo  . Renal disorder     Past Surgical History:  Procedure Laterality Date  . AV FISTULA PLACEMENT Left 08/02/2018   Procedure: CREATION RADIOCEPHALIC ARTERIOVENOUS FISTULA LEFT ARM;  Surgeon: Elam Dutch, MD;  Location: [facility];  Service: Vascular;  Laterality: Left;  . CATARACT EXTRACTION W/ INTRAOCULAR LENS  IMPLANT, BILATERAL    . FOOT SURGERY     right  . INCISION AND DRAINAGE OF WOUND  04/03/2012   Procedure: IRRIGATION AND DEBRIDEMENT WOUND;  Surgeon: Scherry Ran, MD;  Location: [facility];  Service: General;  Laterality: Right;  . IR FLUORO GUIDE CV LINE RIGHT  05/13/2018  . IR US GUIDE VASC ACCESS RIGHT  05/13/2018  . RIGHT/LEFT HEART CATH AND CORONARY ANGIOGRAPHY N/A 07/05/2018   Procedure: RIGHT/LEFT HEART CATH AND  CORONARY ANGIOGRAPHY;  Surgeon: Jettie Booze, MD;  Location: [facility];  Service: Cardiovascular;  Laterality: N/A;    Social History   Socioeconomic History  . Marital status: Married    Spouse name: Not on file  . Number of children: Not on file  . Years of education: Not on file  . Highest education level: Not on file  Occupational History  . Not on file  Social Needs  . Financial resource strain: Not on file  . Food insecurity:    Worry: Not on file    Inability: Not on file  . Transportation needs:    Medical: Not on file    Non-medical: Not on file  Tobacco Use  . Smoking status: Never Smoker  . Smokeless tobacco: Never Used  Substance and Sexual Activity  . Alcohol use: Yes    Alcohol/week: 0.0 standard drinks    Comment: rarely  . Drug use: No  . Sexual activity: Not on file  Lifestyle  . Physical activity:    Days per week: Not on file    Minutes per session: Not on file  . Stress: Not on file  Relationships  . Social connections:    Talks on phone: Not on file    Gets together: Not on file    Attends religious service: Not on file    Active member of club or organization: Not on file    Attends meetings of clubs or organizations: Not on file    Relationship status: Not on file  . Intimate partner violence:    Fear of current or ex partner: Not on file    Emotionally abused: Not on file    Physically abused: Not on file    Forced sexual activity: Not on file  Other Topics Concern  . Not on file  Social History Narrative  . Not on file     Vitals:   10/07/18 1301  BP: 140/66  Pulse: 66  SpO2: 94%  Weight: 269 lb (122 kg)  Height: 5\' 10"  (1.778 m)    Wt Readings from Last 3 Encounters:  10/07/18 269 lb (122 kg)  09/15/18 277 lb (125.6 kg)  08/16/18 277 lb (125.6 kg)     PHYSICAL EXAM General: NAD HEENT: Normal. Neck: No JVD, no thyromegaly. Lungs: Clear to auscultation bilaterally with normal respiratory effort. CV:  Regular rate and irregular rhythm, normal S1/S2, no S3, no murmur. No pretibial or periankle edema.  Abdomen: Soft, nontender, no distention.  Neurologic: Alert and oriented.  Psych: Normal affect. Skin: Normal. Musculoskeletal: No gross deformities.    ECG: Reviewed above under Subjective   Labs: Lab Results  Component Value Date/Time   K 4.5 08/02/2018 06:29 AM   BUN 41 (H) 07/05/2018 07:31 AM   CREATININE 5.54 (H) 07/05/2018 07:31 AM   ALT 37 05/12/2018 01:06 PM   TSH 3.140 09/12/2014 07:59 PM   HGB 9.5 (L) 08/02/2018 06:29 AM  Lipids: Lab Results  Component Value Date/Time   LDLCALC 77 09/13/2014 05:20 AM   CHOL 127 09/13/2014 05:20 AM   TRIG 102 09/13/2014 05:20 AM   HDL 30 (L) 09/13/2014 05:20 AM       ASSESSMENT AND PLAN: 1.  Chronic combined systolic and diastolic heart failure: NYHA class II symptoms.  LVEF 20 to 25% representing a mixed cardiomyopathy with both coronary artery disease and rapid atrial fibrillation contributing.  Volume management per hemodialysis.  He remains on torsemide 60 mg daily as per nephrology.  Also on Toprol-XL 50 mg daily. I am unable to add ACE inhibitors, angiotensin receptor blockers, or angiotensin receptor-neprilysin inhibitors due to advanced chronic kidney disease.  He needs dietary compliance.  2.  Coronary artery disease: Symptomatically stable.  Cardiac catheterization results reviewed above.  Continue aspirin and beta-blocker.  He declined statin therapy.  3.  Persistent atrial fibrillation: Heart rates controlled on Toprol-XL 50 mg daily.  He is not on anticoagulation given his noncompliance and anemia.  Elevated thromboembolic risk previously discussed with the patient but he is adamant about avoiding anticoagulation therapy altogether.  4.  Hypertension: Blood pressure is reasonably controlled.  Continue to monitor.  5.  Hyperlipidemia: He declines statin therapy and Zetia altogether.  Dietary modification  recommended.  6.  Anemia: Likely due to end-stage renal disease.  Hemoglobin most recently 9.3 on 07/05/2018.  7.  End-stage renal disease: He dialyzes every Monday, Wednesday, and Friday.    Disposition: Follow up 6 months   Kate Sable, M.D., F.A.C.C.

## 2018-10-07 NOTE — Patient Instructions (Signed)
Medication Instructions:  Your physician recommends that you continue on your current medications as directed. Please refer to the Current Medication list given to you today.  If you need a refill on your cardiac medications before your next appointment, please call your pharmacy.   Lab work: None If you have labs (blood work) drawn today and your tests are completely normal, you will receive your results only by: Marland Kitchen MyChart Message (if you have MyChart) OR . A paper copy in the mail If you have any lab test that is abnormal or we need to change your treatment, we will call you to review the results.  Testing/Procedures: None  Follow-Up: At [facility], you and your health needs are our priority.  As part of our continuing mission to provide you with exceptional heart care, we have created designated Provider Care Teams.  These Care Teams include your primary Cardiologist (physician) and Advanced Practice Providers (APPs -  Physician Assistants and Nurse Practitioners) who all work together to provide you with the care you need, when you need it. You will need a follow up appointment in 6 months.  Please call our office 2 months in advance to schedule this appointment.  You may see Kate Sable, MD or one of the following Advanced Practice Providers on your designated Care Team:   Bernerd Pho, PA-C [facility]) . Ermalinda Barrios, PA-C ([place])  Any Other Special Instructions Will Be Listed Below (If Applicable). NONE

## 2018-10-12 ENCOUNTER — Ambulatory Visit (HOSPITAL_COMMUNITY)
Admission: RE | Admit: 2018-10-12 | Discharge: 2018-10-12 | Disposition: A | Discharge status: Home / Self Care | Payer: Commercial Managed Care - PPO | Source: Ambulatory Visit | Attending: Vascular Surgery | Admitting: Vascular Surgery

## 2018-10-12 ENCOUNTER — Ambulatory Visit (INDEPENDENT_AMBULATORY_CARE_PROVIDER_SITE_OTHER): Payer: Self-pay | Admitting: Physician Assistant

## 2018-10-12 ENCOUNTER — Other Ambulatory Visit: Payer: Self-pay

## 2018-10-12 DIAGNOSIS — N186 End stage renal disease: Secondary | ICD-10-CM

## 2018-10-12 DIAGNOSIS — Z992 Dependence on renal dialysis: Secondary | ICD-10-CM | POA: Diagnosis not present

## 2018-10-12 NOTE — Progress Notes (Signed)
    Postoperative Access Visit   History of Present Illness   Wesley [name] is a 58 y.o. year old male who presents for postoperative follow-up for: left radiocephalic arteriovenous fistula by Dr. Oneida Alar (Date: 08/02/18).  The patient's wounds are healed.  The patient denies steal symptoms.  The patient is able to complete their activities of daily living.  He is currently dialyzing from R IJ TDC on a TTS schedule.  Fistula is slowly maturing due to competing branches.   ESRD is managed by Dr. Lowanda Foster.  Patient is a Chief Executive Officer for a living and is unwilling to undergo further surgery currently.    Physical Examination   Vitals:   10/12/18 1358  BP: (!) 148/73  Pulse: (!) 113  Resp: 20  Temp: 97.8 F (36.6 C)  TempSrc: Oral  SpO2: 92%  Weight: 267 lb (121.1 kg)  Height: 5\' 10"  (1.778 m)   Body mass index is 38.31 kg/m.  left arm Incision is  healed, hand grip is 5/5, sensation in digits is intact, palpable thrill, bruit can be auscultated     Medical Decision Making   Wesley [name] is a 57 y.o. year old male who presents s/p left radiocephalic arteriovenous fistula   Patent radiocephalic fistula however slow to mature  Based on physical exam, I am not confident that L radiocephalic fistula is adequate enough size for cannulation  Patient insists on giving fistula more due to financial concerns of missing more time at work  He will consent to branch ligation if fistula unchanged after another month   Wesley Ligas PA-C [facility] [place] [place] Office: [phone]

## 2018-10-20 ENCOUNTER — Other Ambulatory Visit: Payer: Self-pay

## 2018-10-20 DIAGNOSIS — Z992 Dependence on renal dialysis: Principal | ICD-10-CM

## 2018-10-20 DIAGNOSIS — N186 End stage renal disease: Secondary | ICD-10-CM

## 2018-10-22 ENCOUNTER — Other Ambulatory Visit: Payer: Self-pay | Admitting: Internal Medicine

## 2018-11-11 NOTE — H&P (View-Only) (Signed)
  POST OPERATIVE OFFICE NOTE    CC:  F/u for surgery  HPI:  This is a 58 y.o. male who is s/p left radial cephalic AVF on 70/1/77 by Dr. Oneida Alar.  He is currently dialyzing via a [place] placed 05/13/18 by IR.  He was seen 10/12/2018 by PA and fistula was slow to mature and there were competing branches present.  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  If not maturing at next visit, pt will consent to surgery.   His ESRD is managed by Dr. Lowanda Foster.    He presents today for follow up.  He states that he is doing well.  No evidence of steal sx.  He states that he is still trying to work as much as possible but it is more difficult now that he is on dialysis.     No Known Allergies  Current Outpatient Medications  Medication Sig Dispense Refill  . aspirin EC 81 MG tablet Take 1 tablet (81 mg total) by mouth daily. 90 tablet 3  . FERREX 150 150 MG capsule Take 150 mg by mouth daily.   5  . metoprolol succinate (TOPROL-XL) 50 MG 24 hr tablet Take 1 tablet (50 mg total) by mouth daily. Take with or immediately following a meal. 90 tablet 3  . Omega-3 Fatty Acids (FISH OIL) 1000 MG CAPS Take 1,000 mg by mouth every evening.     . sevelamer carbonate (RENVELA) 800 MG tablet Take 1,600 mg by mouth 2 (two) times daily with breakfast and lunch.     . torsemide (DEMADEX) 20 MG tablet Take 3 tablets (60 mg total) by mouth daily. 90 tablet 0   No current facility-administered medications for this visit.      ROS:  See HPI  Physical Exam:  Today's Vitals   11/14/18 1447  BP: 136/68  Pulse: 94  Resp: 14  Temp: (!) 97.2 F (36.2 C)  TempSrc: Oral  SpO2: 92%  Weight: 270 lb 15.1 oz (122.9 kg)  Height: 5\' 10"  (1.778 m)   Body mass index is 38.88 kg/m.   Incision:  Well healed Extremities:  Easily palpable; +thrill within fistula; radial artery is palpable Right IJ TDC in place.    Assessment/Plan:  This is a 59 y.o. male who is s/p:  left  radial cephalic AVF on 93/9/03 by Dr. Oneida Alar.  -pt's fistula still not maturing adequately.  It was discussed at his last visit to come back in a month to evaluate and if still not maturing, would proceed with branch ligation.  Discussed with pt fistulogram.and possible intervention that could help the fistula along.  He is willing to proceed.  He understands that he still may need branch ligation as well.  He would like to schedule this on an afternoon he has dialysis.  He is not on blood thinners.     Leontine Locket, PA-C [facility] [phone]  Clinic MD:  Trula Slade

## 2018-11-11 NOTE — Progress Notes (Signed)
  POST OPERATIVE OFFICE NOTE    CC:  F/u for surgery  HPI:  This is a 58 y.o. male who is s/p left radial cephalic AVF on 94/7/65 by Dr. Oneida Alar.  He is currently dialyzing via a [place] placed 05/13/18 by IR.  He was seen 10/12/2018 by PA and fistula was slow to mature and there were competing branches present.  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  If not maturing at next visit, pt will consent to surgery.   His ESRD is managed by Dr. Lowanda Foster.    He presents today for follow up.  He states that he is doing well.  No evidence of steal sx.  He states that he is still trying to work as much as possible but it is more difficult now that he is on dialysis.     No Known Allergies  Current Outpatient Medications  Medication Sig Dispense Refill  . aspirin EC 81 MG tablet Take 1 tablet (81 mg total) by mouth daily. 90 tablet 3  . FERREX 150 150 MG capsule Take 150 mg by mouth daily.   5  . metoprolol succinate (TOPROL-XL) 50 MG 24 hr tablet Take 1 tablet (50 mg total) by mouth daily. Take with or immediately following a meal. 90 tablet 3  . Omega-3 Fatty Acids (FISH OIL) 1000 MG CAPS Take 1,000 mg by mouth every evening.     . sevelamer carbonate (RENVELA) 800 MG tablet Take 1,600 mg by mouth 2 (two) times daily with breakfast and lunch.     . torsemide (DEMADEX) 20 MG tablet Take 3 tablets (60 mg total) by mouth daily. 90 tablet 0   No current facility-administered medications for this visit.      ROS:  See HPI  Physical Exam:  Today's Vitals   11/14/18 1447  BP: 136/68  Pulse: 94  Resp: 14  Temp: (!) 97.2 F (36.2 C)  TempSrc: Oral  SpO2: 92%  Weight: 270 lb 15.1 oz (122.9 kg)  Height: 5\' 10"  (1.778 m)   Body mass index is 38.88 kg/m.   Incision:  Well healed Extremities:  Easily palpable; +thrill within fistula; radial artery is palpable Right IJ TDC in place.    Assessment/Plan:  This is a 58 y.o. male who is s/p:  left  radial cephalic AVF on 46/5/03 by Dr. Oneida Alar.  -pt's fistula still not maturing adequately.  It was discussed at his last visit to come back in a month to evaluate and if still not maturing, would proceed with branch ligation.  Discussed with pt fistulogram.and possible intervention that could help the fistula along.  He is willing to proceed.  He understands that he still may need branch ligation as well.  He would like to schedule this on an afternoon he has dialysis.  He is not on blood thinners.     Wesley Locket, PA-C [facility] [phone]  Clinic MD:  Wesley [name]

## 2018-11-11 NOTE — H&P (View-Only) (Signed)
  POST OPERATIVE OFFICE NOTE    CC:  F/u for surgery  HPI:  This is a 58 y.o. male who is s/p left radial cephalic AVF on 69/4/85 by Dr. Oneida Alar.  He is currently dialyzing via a [place] placed 05/13/18 by IR.  He was seen 10/12/2018 by PA and fistula was slow to mature and there were competing branches present.  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  If not maturing at next visit, pt will consent to surgery.   His ESRD is managed by Dr. Lowanda Foster.    He presents today for follow up.  He states that he is doing well.  No evidence of steal sx.  He states that he is still trying to work as much as possible but it is more difficult now that he is on dialysis.     No Known Allergies  Current Outpatient Medications  Medication Sig Dispense Refill  . aspirin EC 81 MG tablet Take 1 tablet (81 mg total) by mouth daily. 90 tablet 3  . FERREX 150 150 MG capsule Take 150 mg by mouth daily.   5  . metoprolol succinate (TOPROL-XL) 50 MG 24 hr tablet Take 1 tablet (50 mg total) by mouth daily. Take with or immediately following a meal. 90 tablet 3  . Omega-3 Fatty Acids (FISH OIL) 1000 MG CAPS Take 1,000 mg by mouth every evening.     . sevelamer carbonate (RENVELA) 800 MG tablet Take 1,600 mg by mouth 2 (two) times daily with breakfast and lunch.     . torsemide (DEMADEX) 20 MG tablet Take 3 tablets (60 mg total) by mouth daily. 90 tablet 0   No current facility-administered medications for this visit.      ROS:  See HPI  Physical Exam:  Today's Vitals   11/14/18 1447  BP: 136/68  Pulse: 94  Resp: 14  Temp: (!) 97.2 F (36.2 C)  TempSrc: Oral  SpO2: 92%  Weight: 270 lb 15.1 oz (122.9 kg)  Height: 5\' 10"  (1.778 m)   Body mass index is 38.88 kg/m.   Incision:  Well healed Extremities:  Easily palpable; +thrill within fistula; radial artery is palpable Right IJ TDC in place.    Assessment/Plan:  This is a 58 y.o. male who is s/p:  left  radial cephalic AVF on 46/2/70 by Dr. Oneida Alar.  -pt's fistula still not maturing adequately.  It was discussed at his last visit to come back in a month to evaluate and if still not maturing, would proceed with branch ligation.  Discussed with pt fistulogram.and possible intervention that could help the fistula along.  He is willing to proceed.  He understands that he still may need branch ligation as well.  He would like to schedule this on an afternoon he has dialysis.  He is not on blood thinners.     Leontine Locket, PA-C [facility] [phone]  Clinic MD:  Trula Slade

## 2018-11-14 ENCOUNTER — Ambulatory Visit (INDEPENDENT_AMBULATORY_CARE_PROVIDER_SITE_OTHER): Payer: Self-pay | Admitting: Physician Assistant

## 2018-11-14 ENCOUNTER — Other Ambulatory Visit: Payer: Self-pay | Admitting: *Deleted

## 2018-11-14 ENCOUNTER — Encounter: Payer: Self-pay | Admitting: Family

## 2018-11-14 ENCOUNTER — Encounter: Payer: Self-pay | Admitting: *Deleted

## 2018-11-14 ENCOUNTER — Ambulatory Visit (HOSPITAL_COMMUNITY)
Admission: RE | Admit: 2018-11-14 | Discharge: 2018-11-14 | Disposition: A | Discharge status: Home / Self Care | Payer: Commercial Managed Care - PPO | Source: Ambulatory Visit | Attending: Family | Admitting: Family

## 2018-11-14 VITALS — BP 136/68 | HR 94 | Temp 97.2°F | Resp 14 | Ht 70.0 in | Wt 270.9 lb

## 2018-11-14 DIAGNOSIS — Z992 Dependence on renal dialysis: Secondary | ICD-10-CM

## 2018-11-14 DIAGNOSIS — N186 End stage renal disease: Secondary | ICD-10-CM | POA: Diagnosis present

## 2018-11-29 ENCOUNTER — Ambulatory Visit (HOSPITAL_COMMUNITY)
Admission: RE | Admit: 2018-11-29 | Discharge: 2018-11-29 | Disposition: A | Discharge status: Home / Self Care | Payer: Commercial Managed Care - PPO | Attending: Surgery | Admitting: Surgery

## 2018-11-29 ENCOUNTER — Encounter (HOSPITAL_COMMUNITY): Payer: Self-pay | Admitting: Surgery

## 2018-11-29 ENCOUNTER — Encounter (HOSPITAL_COMMUNITY)
Admission: RE | Disposition: A | Discharge status: Home / Self Care | Payer: Self-pay | Source: Home / Self Care | Attending: Surgery

## 2018-11-29 ENCOUNTER — Other Ambulatory Visit: Payer: Self-pay

## 2018-11-29 DIAGNOSIS — Z79899 Other long term (current) drug therapy: Secondary | ICD-10-CM | POA: Diagnosis not present

## 2018-11-29 DIAGNOSIS — Z992 Dependence on renal dialysis: Secondary | ICD-10-CM | POA: Insufficient documentation

## 2018-11-29 DIAGNOSIS — N186 End stage renal disease: Secondary | ICD-10-CM

## 2018-11-29 DIAGNOSIS — Z7982 Long term (current) use of aspirin: Secondary | ICD-10-CM | POA: Insufficient documentation

## 2018-11-29 DIAGNOSIS — T82898A Other specified complication of vascular prosthetic devices, implants and grafts, initial encounter: Secondary | ICD-10-CM | POA: Diagnosis not present

## 2018-11-29 DIAGNOSIS — Z4931 Encounter for adequacy testing for hemodialysis: Secondary | ICD-10-CM | POA: Insufficient documentation

## 2018-11-29 HISTORY — PX: A/V FISTULAGRAM: CATH118298

## 2018-11-29 LAB — POCT I-STAT CREATININE: Creatinine, Ser: 6.3 mg/dL — ABNORMAL HIGH (ref 0.61–1.24)

## 2018-11-29 LAB — POCT I-STAT 4, (NA,K, GLUC, HGB,HCT)
Glucose, Bld: 93 mg/dL (ref 70–99)
HCT: 35 % — ABNORMAL LOW (ref 39.0–52.0)
Hemoglobin: 11.9 g/dL — ABNORMAL LOW (ref 13.0–17.0)
Potassium: 4.3 mmol/L (ref 3.5–5.1)
Sodium: 140 mmol/L (ref 135–145)

## 2018-11-29 SURGERY — A/V FISTULAGRAM
Anesthesia: LOCAL

## 2018-11-29 MED ORDER — HEPARIN (PORCINE) IN NACL 1000-0.9 UT/500ML-% IV SOLN
INTRAVENOUS | Status: AC
  Filled 2018-11-29: qty 500

## 2018-11-29 MED ORDER — LIDOCAINE HCL (PF) 1 % IJ SOLN
INTRAMUSCULAR | Status: DC | PRN
  Administered 2018-11-29: 2 mL via INTRADERMAL

## 2018-11-29 MED ORDER — SODIUM CHLORIDE 0.9 % IV SOLN: 250.0000 mL | INTRAVENOUS | Status: DC | PRN

## 2018-11-29 MED ORDER — SODIUM CHLORIDE 0.9% FLUSH: 3.0000 mL | Freq: Two times a day (BID) | INTRAVENOUS | Status: DC

## 2018-11-29 MED ORDER — SODIUM CHLORIDE 0.9% FLUSH: 3.0000 mL | INTRAVENOUS | Status: DC | PRN

## 2018-11-29 MED ORDER — IODIXANOL 320 MG/ML IV SOLN
INTRAVENOUS | Status: DC | PRN
  Administered 2018-11-29: 70 mL via INTRAVENOUS

## 2018-11-29 MED ORDER — LIDOCAINE HCL (PF) 1 % IJ SOLN
INTRAMUSCULAR | Status: AC
  Filled 2018-11-29: qty 30

## 2018-11-29 MED ORDER — HEPARIN (PORCINE) IN NACL 1000-0.9 UT/500ML-% IV SOLN
INTRAVENOUS | Status: DC | PRN
  Administered 2018-11-29: 500 mL

## 2018-11-29 SURGICAL SUPPLY — 10 items
BAG SNAP BAND KOVER 36X36 (MISCELLANEOUS) ×2 IMPLANT
COVER DOME SNAP 22 D (MISCELLANEOUS) ×2 IMPLANT
KIT MICROPUNCTURE NIT STIFF (SHEATH) ×1 IMPLANT
PROTECTION STATION PRESSURIZED (MISCELLANEOUS) ×2
SHEATH PROBE COVER 6X72 (BAG) ×3 IMPLANT
STATION PROTECTION PRESSURIZED (MISCELLANEOUS) ×1 IMPLANT
STOPCOCK MORSE 400PSI 3WAY (MISCELLANEOUS) ×2 IMPLANT
TRAY PV CATH (CUSTOM PROCEDURE TRAY) ×2 IMPLANT
TUBING CIL FLEX 10 FLL-RA (TUBING) ×2 IMPLANT
WIRE TORQFLEX AUST .018X40CM (WIRE) ×1 IMPLANT

## 2018-11-29 NOTE — Interval H&P Note (Signed)
History and Physical Interval Note:  11/29/2018 9:59 AM  Wesley [name]  has presented today for surgery, with the diagnosis of poor flow  The various methods of treatment have been discussed with the patient and family. After consideration of risks, benefits and other options for treatment, the patient has consented to  Procedure(s): A/V FISTULAGRAM - left arm (N/A) as a surgical intervention .  The patient's history has been reviewed, patient examined, no change in status, stable for surgery.  I have reviewed the patient's chart and labs.  Questions were answered to the patient's satisfaction.     [name]

## 2018-11-29 NOTE — Discharge Instructions (Signed)

## 2018-11-29 NOTE — Op Note (Signed)
    Patient name: Wesley [name] MRN: [id number] DOB: [birth year] Sex: male  11/29/2018 Pre-operative Diagnosis: ESRD Post-operative diagnosis:  Same Surgeon:  [name] Procedure Performed:  1.  Ultrasound-guided access, left cephalic vein  2.  Fistulogram  3.  Conscious sedation (15 minutes)     Indications: The patient has a non-maturing fistula.  He is here today for further evaluation.  Procedure:  The patient was identified in the holding area and taken to room 8.  The patient was then placed supine on the table and prepped and draped in the usual sterile fashion.  A time out was called.  Ultrasound was used to evaluate the fistula.  The vein was patent and compressible.  A digital ultrasound image was acquired.  The fistula was then accessed under ultrasound guidance using a micropuncture needle.  An 018 wire was then asvanced without resistance and a micropuncture sheath was placed.  Contrast injections were then performed through the sheath.  Findings: The central venous system is widely patent.  The cephalic vein fistula is patent throughout the arm.  The arterial venous anastomosis is widely patent.  There are several branches, one near the arterial venous anastomosis, and one towards the deep system antecubital crease.   Intervention: None  Impression:  #1  Non-maturing left radiocephalic fistula  #2  There is a branch towards the deep system near the antecubital crease another one in the mid fistula, and one near the anastomosis.  #3  The patient will be scheduled for branch ligation of his radiocephalic fistula.  If this does not allow the fistula to mature, he would be a candidate for left brachiocephalic fistula    V. [name], M.D. [facility] [place] Office: [phone] Pager:  [phone]

## 2018-11-30 ENCOUNTER — Telehealth: Payer: Self-pay | Admitting: *Deleted

## 2018-11-30 NOTE — Telephone Encounter (Signed)
I have left 2 messages for patient to return call to schedule procedure. One on home phone and another with [facility].

## 2018-12-02 ENCOUNTER — Other Ambulatory Visit: Payer: Self-pay | Admitting: *Deleted

## 2018-12-02 NOTE — Progress Notes (Signed)
Patient called to schedule procedure. Instructed to be at [facility] admitting at 6:45 am on 12/08/2018(patient's choice) . NPO past MN night prior and must have a driver and caregiver for discharge. Expect a call and follow the detailed surgery instructions received from the hospital pre-admission department. Verbalized understanding.

## 2018-12-06 ENCOUNTER — Encounter (HOSPITAL_COMMUNITY): Payer: Self-pay | Admitting: *Deleted

## 2018-12-06 ENCOUNTER — Other Ambulatory Visit: Payer: Self-pay

## 2018-12-06 NOTE — Progress Notes (Signed)
Pt denies SOB and chest pain. Pt under the care of Bernerd Pho, [place], Cardiology. Pt denies having a stress test. Pt made aware to stop taking vitamins, fish oil and herbal medications. Do not take any NSAIDs ie: Ibuprofen, Advil, Naproxen (Aleve), Motrin, BC and Goody Powder.  Pt made aware to check BG every 2 hours prior to arrival to hospital on DOS. Pt made aware to treat a BG < 70 with 4 ounces of apple or cranberry juice, wait 15 minutes after intervention to recheck BG, if BG remains < 70, call Short Stay unit to speak with a nurse. Pt verbalized understanding of all pre-op instructions. PA, Anesthesiology,  asked to review pt history.

## 2018-12-07 MED ORDER — DEXTROSE 5 % IV SOLN
3.0000 g | INTRAVENOUS | Status: AC
  Administered 2018-12-08: 3 g via INTRAVENOUS
  Filled 2018-12-07: qty 3

## 2018-12-07 NOTE — Anesthesia Preprocedure Evaluation (Addendum)
Anesthesia Evaluation  Patient identified by MRN, date of birth, ID band Patient awake    Reviewed: Allergy & Precautions, H&P , NPO status , Patient's Chart, lab work & pertinent test results  Airway Mallampati: II  TM Distance: <3 FB Neck ROM: full  Mouth opening: Limited Mouth Opening  Dental  (+) Poor Dentition, Chipped, Missing, Dental Advisory Given   Pulmonary neg pulmonary ROS,    breath sounds clear to auscultation       Cardiovascular hypertension, + CAD, + Past MI and +CHF  + dysrhythmias Atrial Fibrillation  Rhythm:regular Rate:Normal     Neuro/Psych    GI/Hepatic   Endo/Other  diabetes, Type 2  Renal/GU ESRFRenal disease     Musculoskeletal   Abdominal   Peds  Hematology   Anesthesia Other Findings   Reproductive/Obstetrics                           Anesthesia Physical Anesthesia Plan  ASA: III  Anesthesia Plan: MAC   Post-op Pain Management:    Induction: Intravenous  PONV Risk Score and Plan: 1 and Ondansetron, Propofol infusion, Treatment may vary due to age or medical condition and Midazolam  Airway Management Planned: Simple Face Mask  Additional Equipment:   Intra-op Plan:   Post-operative Plan:   Informed Consent: I have reviewed the patients History and Physical, chart, labs and discussed the procedure including the risks, benefits and alternatives for the proposed anesthesia with the patient or authorized representative who has indicated his/her understanding and acceptance.       Plan Discussed with: CRNA, Anesthesiologist and Surgeon  Anesthesia Plan Comments: (Echocardiogram on 06/28/2018 demonstrated severely reduced left ventricular systolic function, LVEF 20 to 25%, with diffuse hypokinesis.  Cardiac catheterization was performed on 07/05/2018 and showed 80% D1 stenosis, 70% Proximal-LAD, 50% mid-RCA, 80% mid to distal-RCA, 100% distal RCA with  left to right collaterals, and moderate pulmonary HTN. Continued medical therapy was recommended at that time given no recent anginal symptoms and in the setting of medication noncompliance and upcoming fistula placement. Not felt to be an ideal CABG candidate given his renal dysfunction and cardiomyopathy but could consider PCI if he demonstrates medical compliance.   He was cleared by cardiology to undergo fistula placement 08/02/2018 "No further preoperative testing is indicated prior to AV Fistula placement. He is of intermediate risk from a cardiac perspective given his CAD and cardiomyopathy." Underwent procedure without complication.  )       Anesthesia Quick Evaluation

## 2018-12-08 ENCOUNTER — Encounter (HOSPITAL_COMMUNITY): Payer: Self-pay | Admitting: *Deleted

## 2018-12-08 ENCOUNTER — Ambulatory Visit (HOSPITAL_COMMUNITY)
Admission: RE | Admit: 2018-12-08 | Discharge: 2018-12-08 | Disposition: A | Discharge status: Home / Self Care | Payer: Commercial Managed Care - PPO | Attending: Surgery | Admitting: Surgery

## 2018-12-08 ENCOUNTER — Other Ambulatory Visit: Payer: Self-pay

## 2018-12-08 ENCOUNTER — Encounter (HOSPITAL_COMMUNITY)
Admission: RE | Disposition: A | Discharge status: Home / Self Care | Payer: Self-pay | Source: Home / Self Care | Attending: Surgery

## 2018-12-08 ENCOUNTER — Ambulatory Visit (HOSPITAL_COMMUNITY): Payer: Commercial Managed Care - PPO | Admitting: Physician Assistant

## 2018-12-08 DIAGNOSIS — Z992 Dependence on renal dialysis: Secondary | ICD-10-CM

## 2018-12-08 DIAGNOSIS — T82898A Other specified complication of vascular prosthetic devices, implants and grafts, initial encounter: Secondary | ICD-10-CM | POA: Insufficient documentation

## 2018-12-08 DIAGNOSIS — E1122 Type 2 diabetes mellitus with diabetic chronic kidney disease: Secondary | ICD-10-CM | POA: Insufficient documentation

## 2018-12-08 DIAGNOSIS — I509 Heart failure, unspecified: Secondary | ICD-10-CM | POA: Insufficient documentation

## 2018-12-08 DIAGNOSIS — Y832 Surgical operation with anastomosis, bypass or graft as the cause of abnormal reaction of the patient, or of later complication, without mention of misadventure at the time of the procedure: Secondary | ICD-10-CM | POA: Diagnosis not present

## 2018-12-08 DIAGNOSIS — I132 Hypertensive heart and chronic kidney disease with heart failure and with stage 5 chronic kidney disease, or end stage renal disease: Secondary | ICD-10-CM | POA: Insufficient documentation

## 2018-12-08 DIAGNOSIS — I2510 Atherosclerotic heart disease of native coronary artery without angina pectoris: Secondary | ICD-10-CM | POA: Diagnosis not present

## 2018-12-08 DIAGNOSIS — Z7982 Long term (current) use of aspirin: Secondary | ICD-10-CM | POA: Insufficient documentation

## 2018-12-08 DIAGNOSIS — N186 End stage renal disease: Secondary | ICD-10-CM | POA: Insufficient documentation

## 2018-12-08 DIAGNOSIS — I4891 Unspecified atrial fibrillation: Secondary | ICD-10-CM | POA: Diagnosis not present

## 2018-12-08 DIAGNOSIS — Z79899 Other long term (current) drug therapy: Secondary | ICD-10-CM | POA: Diagnosis not present

## 2018-12-08 DIAGNOSIS — I252 Old myocardial infarction: Secondary | ICD-10-CM | POA: Diagnosis not present

## 2018-12-08 HISTORY — PX: LIGATION OF ARTERIOVENOUS  FISTULA: SHX5948

## 2018-12-08 LAB — POCT I-STAT 4, (NA,K, GLUC, HGB,HCT)
Glucose, Bld: 100 mg/dL — ABNORMAL HIGH (ref 70–99)
HCT: 35 % — ABNORMAL LOW (ref 39.0–52.0)
Hemoglobin: 11.9 g/dL — ABNORMAL LOW (ref 13.0–17.0)
Potassium: 4.7 mmol/L (ref 3.5–5.1)
SODIUM: 140 mmol/L (ref 135–145)

## 2018-12-08 LAB — GLUCOSE, CAPILLARY
Glucose-Capillary: 90 mg/dL (ref 70–99)
Glucose-Capillary: 79 mg/dL (ref 70–99)
Glucose-Capillary: 80 mg/dL (ref 70–99)

## 2018-12-08 LAB — SURGICAL PCR SCREEN
MRSA, PCR: NEGATIVE
Staphylococcus aureus: NEGATIVE

## 2018-12-08 SURGERY — LIGATION OF ARTERIOVENOUS  FISTULA
Anesthesia: General | Site: Arm Lower | Laterality: Left

## 2018-12-08 MED ORDER — DEXAMETHASONE SODIUM PHOSPHATE 10 MG/ML IJ SOLN
INTRAMUSCULAR | Status: AC
  Filled 2018-12-08: qty 1

## 2018-12-08 MED ORDER — SUCCINYLCHOLINE CHLORIDE 200 MG/10ML IV SOSY
PREFILLED_SYRINGE | INTRAVENOUS | Status: AC
  Filled 2018-12-08: qty 10

## 2018-12-08 MED ORDER — PROPOFOL 10 MG/ML IV BOLUS
INTRAVENOUS | Status: AC
  Filled 2018-12-08: qty 20

## 2018-12-08 MED ORDER — ONDANSETRON HCL 4 MG/2ML IJ SOLN: 4.0000 mg | Freq: Four times a day (QID) | INTRAMUSCULAR | Status: DC | PRN

## 2018-12-08 MED ORDER — ONDANSETRON HCL 4 MG/2ML IJ SOLN
INTRAMUSCULAR | Status: DC | PRN
  Administered 2018-12-08: 4 mg via INTRAVENOUS

## 2018-12-08 MED ORDER — OXYCODONE HCL 5 MG PO TABS: 5.0000 mg | ORAL_TABLET | Freq: Once | ORAL | Status: DC | PRN

## 2018-12-08 MED ORDER — FENTANYL CITRATE (PF) 250 MCG/5ML IJ SOLN
INTRAMUSCULAR | Status: DC | PRN
  Administered 2018-12-08 (×2): 50 ug via INTRAVENOUS

## 2018-12-08 MED ORDER — SODIUM CHLORIDE 0.9 % IV SOLN
INTRAVENOUS | Status: DC | PRN
  Administered 2018-12-08: 50 ug/min via INTRAVENOUS

## 2018-12-08 MED ORDER — PHENYLEPHRINE 40 MCG/ML (10ML) SYRINGE FOR IV PUSH (FOR BLOOD PRESSURE SUPPORT)
PREFILLED_SYRINGE | INTRAVENOUS | Status: DC | PRN
  Administered 2018-12-08 (×5): 80 ug via INTRAVENOUS

## 2018-12-08 MED ORDER — SODIUM CHLORIDE 0.9 % IV SOLN
INTRAVENOUS | Status: AC
  Filled 2018-12-08: qty 1.2

## 2018-12-08 MED ORDER — PROPOFOL 10 MG/ML IV BOLUS
INTRAVENOUS | Status: DC | PRN
  Administered 2018-12-08 (×4): 50 mg via INTRAVENOUS

## 2018-12-08 MED ORDER — CHLORHEXIDINE GLUCONATE 4 % EX LIQD: 60.0000 mL | Freq: Once | CUTANEOUS | Status: DC

## 2018-12-08 MED ORDER — SUCCINYLCHOLINE CHLORIDE 200 MG/10ML IV SOSY
PREFILLED_SYRINGE | INTRAVENOUS | Status: DC | PRN
  Administered 2018-12-08: 120 mg via INTRAVENOUS

## 2018-12-08 MED ORDER — FENTANYL CITRATE (PF) 100 MCG/2ML IJ SOLN: 25.0000 ug | INTRAMUSCULAR | Status: DC | PRN

## 2018-12-08 MED ORDER — LIDOCAINE 2% (20 MG/ML) 5 ML SYRINGE
INTRAMUSCULAR | Status: AC
  Filled 2018-12-08: qty 5

## 2018-12-08 MED ORDER — LIDOCAINE-EPINEPHRINE (PF) 1 %-1:200000 IJ SOLN
INTRAMUSCULAR | Status: AC
  Filled 2018-12-08: qty 30

## 2018-12-08 MED ORDER — ETOMIDATE 2 MG/ML IV SOLN
INTRAVENOUS | Status: DC | PRN
  Administered 2018-12-08: 20 mg via INTRAVENOUS

## 2018-12-08 MED ORDER — MIDAZOLAM HCL 2 MG/2ML IJ SOLN
INTRAMUSCULAR | Status: AC
  Filled 2018-12-08: qty 2

## 2018-12-08 MED ORDER — SODIUM CHLORIDE 0.9 % IV SOLN
INTRAVENOUS | Status: DC
  Administered 2018-12-08 (×3): via INTRAVENOUS

## 2018-12-08 MED ORDER — FENTANYL CITRATE (PF) 250 MCG/5ML IJ SOLN
INTRAMUSCULAR | Status: AC
  Filled 2018-12-08: qty 5

## 2018-12-08 MED ORDER — OXYCODONE HCL 5 MG/5ML PO SOLN: 5.0000 mg | Freq: Once | ORAL | Status: DC | PRN

## 2018-12-08 MED ORDER — OXYCODONE HCL 5 MG PO TABS: 5.0000 mg | ORAL_TABLET | Freq: Three times a day (TID) | ORAL | 0 refills | Status: DC | PRN

## 2018-12-08 MED ORDER — ETOMIDATE 2 MG/ML IV SOLN
INTRAVENOUS | Status: AC
  Filled 2018-12-08: qty 10

## 2018-12-08 MED ORDER — 0.9 % SODIUM CHLORIDE (POUR BTL) OPTIME
TOPICAL | Status: DC | PRN
  Administered 2018-12-08: 1000 mL

## 2018-12-08 MED ORDER — PHENYLEPHRINE 40 MCG/ML (10ML) SYRINGE FOR IV PUSH (FOR BLOOD PRESSURE SUPPORT)
PREFILLED_SYRINGE | INTRAVENOUS | Status: AC
  Filled 2018-12-08: qty 10

## 2018-12-08 MED ORDER — LIDOCAINE 2% (20 MG/ML) 5 ML SYRINGE
INTRAMUSCULAR | Status: DC | PRN
  Administered 2018-12-08: 60 mg via INTRAVENOUS

## 2018-12-08 MED ORDER — ONDANSETRON HCL 4 MG/2ML IJ SOLN
INTRAMUSCULAR | Status: AC
  Filled 2018-12-08: qty 2

## 2018-12-08 MED ORDER — HEMOSTATIC AGENTS (NO CHARGE) OPTIME
TOPICAL | Status: DC | PRN
  Administered 2018-12-08: 1 via TOPICAL

## 2018-12-08 SURGICAL SUPPLY — 36 items
CANISTER SUCT 3000ML PPV (MISCELLANEOUS) ×3 IMPLANT
CATH EMB 3FR 40CM (CATHETERS) ×3 IMPLANT
CLIP VESOCCLUDE MED 6/CT (CLIP) ×3 IMPLANT
CLIP VESOCCLUDE SM WIDE 6/CT (CLIP) ×6 IMPLANT
COVER PROBE W GEL 5X96 (DRAPES) ×3 IMPLANT
COVER WAND RF STERILE (DRAPES) ×3 IMPLANT
DERMABOND ADVANCED (GAUZE/BANDAGES/DRESSINGS) ×2
DERMABOND ADVANCED .7 DNX12 (GAUZE/BANDAGES/DRESSINGS) ×1 IMPLANT
ELECT REM PT RETURN 9FT ADLT (ELECTROSURGICAL) ×3
ELECTRODE REM PT RTRN 9FT ADLT (ELECTROSURGICAL) ×1 IMPLANT
GLOVE BIOGEL PI IND STRL 7.5 (GLOVE) ×1 IMPLANT
GLOVE BIOGEL PI INDICATOR 7.5 (GLOVE) ×2
GLOVE SURG SS PI 7.5 STRL IVOR (GLOVE) ×3 IMPLANT
GOWN STRL REUS W/ TWL LRG LVL3 (GOWN DISPOSABLE) ×2 IMPLANT
GOWN STRL REUS W/ TWL XL LVL3 (GOWN DISPOSABLE) ×1 IMPLANT
GOWN STRL REUS W/TWL LRG LVL3 (GOWN DISPOSABLE) ×4
GOWN STRL REUS W/TWL XL LVL3 (GOWN DISPOSABLE) ×2
HEMOSTAT SNOW SURGICEL 2X4 (HEMOSTASIS) IMPLANT
KIT BASIN OR (CUSTOM PROCEDURE TRAY) ×3 IMPLANT
KIT TURNOVER KIT B (KITS) ×3 IMPLANT
MARKER PEN SURG W/LABELS BLK (STERILIZATION PRODUCTS) ×3 IMPLANT
NS IRRIG 1000ML POUR BTL (IV SOLUTION) ×3 IMPLANT
PACK CV ACCESS (CUSTOM PROCEDURE TRAY) ×3 IMPLANT
PAD ARMBOARD 7.5X6 YLW CONV (MISCELLANEOUS) ×6 IMPLANT
SUT ETHILON 3 0 PS 1 (SUTURE) IMPLANT
SUT PROLENE 6 0 BV (SUTURE) ×3 IMPLANT
SUT SILK 0 TIES 10X30 (SUTURE) ×3 IMPLANT
SUT SILK 3 0 (SUTURE) ×2
SUT SILK 3-0 18XBRD TIE 12 (SUTURE) ×1 IMPLANT
SUT VIC AB 3-0 SH 27 (SUTURE) ×4
SUT VIC AB 3-0 SH 27X BRD (SUTURE) ×2 IMPLANT
SUT VICRYL 4-0 PS2 18IN ABS (SUTURE) IMPLANT
SYR TB 1ML LUER SLIP (SYRINGE) ×3 IMPLANT
TOWEL GREEN STERILE (TOWEL DISPOSABLE) ×3 IMPLANT
UNDERPAD 30X30 (UNDERPADS AND DIAPERS) ×3 IMPLANT
WATER STERILE IRR 1000ML POUR (IV SOLUTION) ×3 IMPLANT

## 2018-12-08 NOTE — Transfer of Care (Signed)
Immediate Anesthesia Transfer of Care Note  Patient: Wesley [name]  Procedure(s) Performed: BRANCH LIGATION OF LEFT RADIOCEPHALIC ARTERIOVENOUS FISTULA (Left Arm Lower)  Patient Location: PACU  Anesthesia Type:General  Level of Consciousness: awake and drowsy  Airway & Oxygen Therapy: Patient Spontanous Breathing and Patient connected to face mask oxygen  Post-op Assessment: Report given to RN, Post -op Vital signs reviewed and stable and Patient moving all extremities X 4  Post vital signs: Reviewed and stable  Last Vitals:  Vitals Value Taken Time  BP 125/104 12/08/2018 11:47 AM  Temp    Pulse 100 12/08/2018 11:47 AM  Resp 13 12/08/2018 11:47 AM  SpO2 93 % 12/08/2018 11:47 AM  Vitals shown include unvalidated device data.  Last Pain:  Vitals:   12/08/18 0724  TempSrc:   PainSc: 0-No pain      Patients Stated Pain Goal: 3 (83/46/21 9471)  Complications: No apparent anesthesia complications

## 2018-12-08 NOTE — Anesthesia Procedure Notes (Signed)
Procedure Name: Intubation Date/Time: 12/08/2018 9:47 AM Performed by: Harden Mo, CRNA Pre-anesthesia Checklist: Patient identified, Emergency Drugs available, Suction available and Patient being monitored Patient Re-evaluated:Patient Re-evaluated prior to induction Oxygen Delivery Method: Circle System Utilized Preoxygenation: Pre-oxygenation with 100% oxygen Induction Type: IV induction Ventilation: Mask ventilation with difficulty, Two handed mask ventilation required and Oral airway inserted - appropriate to patient size Laryngoscope Size: Sabra Heck and 2 Grade View: Grade I Tube type: Oral Tube size: 7.5 mm Number of attempts: 1 Airway Equipment and Method: Stylet and Oral airway Placement Confirmation: ETT inserted through vocal cords under direct vision,  positive ETCO2 and breath sounds checked- equal and bilateral Secured at: 24 cm Tube secured with: Tape Dental Injury: Teeth and Oropharynx as per pre-operative assessment

## 2018-12-08 NOTE — Interval H&P Note (Signed)
History and Physical Interval Note:  12/08/2018 8:54 AM  Wesley [name]  has presented today for surgery, with the diagnosis of COMPLICATION WITH ARTERIOVENOUS FISTULA LEFT UPPER EXTREMITY.  The various methods of treatment have been discussed with the patient and family. After consideration of risks, benefits and other options for treatment, the patient has consented to  Procedure(s): BRANCH LIGATION OF LEFT RADIOCEPHALIC ARTERIOVENOUS FISTULA (Left) as a surgical intervention.  The patient's history has been reviewed, patient examined, no change in status, stable for surgery.  I have reviewed the patient's chart and labs.  Questions were answered to the patient's satisfaction.     [name]

## 2018-12-08 NOTE — Op Note (Signed)
    Patient name: Zaire Levesque MRN: [id number] DOB: 1961-09-10 Sex: male  12/08/2018 Pre-operative Diagnosis: ESRD Post-operative diagnosis:  Same Surgeon:  [name] Assistants:  Delena Serve Procedure:   Branch ligation x5 of left radiocephalic fistula Anesthesia: General Blood Loss: Minimal Specimens: None  Findings: After ligation of multiple venous branches through 3 separate incisions, the patient had a significant improvement and a thrill within the fistula.  Indications: The patient has non-maturity of the left radiocephalic fistula.  He is undergone a duplex as well as a venogram which showed that branches with the most likely cause of non-maturation.  He comes in today for branch ligation.  I discussed with the patient and his wife that if this does not allow the fistula to mature that he would be a candidate for a brachiocephalic fistula.  Procedure:  The patient was identified in the holding area and taken to [place] [facility]  The patient was then placed supine on the table. general anesthesia was administered.  The patient was prepped and draped in the usual sterile fashion.  A time out was called and antibiotics were administered.  Ultrasound was used to evaluate the cephalic vein in the forearm.  Multiple branches were identified.  I made 3 longitudinal incisions over the fistula 1 near the arterial anastomosis 1 in the mid forearm and 1 just distal to the antecubital crease.  In the 2 distal incisions, I ligated 2 branches within each incision.  At the incision near the antecubital crease, there was a branch going to the deep system.  I ligated this branch and used the opening on the branch of the fistula to pass a Fogarty catheter which went all the way across the anastomosis without difficulty.  I then ligated the branch after removing the catheter with a 3-0 silk tie.  After this maneuver, there was a significant improvement in the thrill within the fistula.  I  then obtained hemostasis and irrigated the wounds.  The incisions were closed with 2 layers with 3-0 Vicryl followed by Dermabond.  There were no immediate complications.   Disposition: To PACU stable.   Theotis Burrow, M.D. [facility] [place] Office: [phone] Pager:  [phone]

## 2018-12-09 ENCOUNTER — Encounter (HOSPITAL_COMMUNITY): Payer: Self-pay | Admitting: Surgery

## 2018-12-09 ENCOUNTER — Telehealth: Payer: Self-pay | Admitting: Surgery

## 2018-12-09 NOTE — Anesthesia Postprocedure Evaluation (Signed)
Anesthesia Post Note  Patient: Wesley [name]  Procedure(s) Performed: BRANCH LIGATION OF LEFT RADIOCEPHALIC ARTERIOVENOUS FISTULA (Left Arm Lower)     Patient location during evaluation: PACU Anesthesia Type: General Level of consciousness: awake and alert Pain management: pain level controlled Vital Signs Assessment: post-procedure vital signs reviewed and stable Respiratory status: spontaneous breathing, nonlabored ventilation, respiratory function stable and patient connected to nasal cannula oxygen Cardiovascular status: blood pressure returned to baseline and stable Postop Assessment: no apparent nausea or vomiting Anesthetic complications: no    Last Vitals:  Vitals:   12/08/18 1415 12/08/18 1420  BP:  137/64  Pulse: 82 84  Resp: 18 18  Temp:    SpO2: 96% 94%    Last Pain:  Vitals:   12/08/18 1420  TempSrc:   PainSc: 0-No pain                 Oz Gammel S

## 2018-12-09 NOTE — Telephone Encounter (Signed)
sch appt [facility] ltr 01/09/2019 2pm Dialysis duplex 3pm p/o MD

## 2018-12-09 NOTE — Telephone Encounter (Signed)
-----   Message from Mena Goes, RN sent at 12/09/2018  9:03 AM EDT ----- Regarding: 4 weeks  ----- Message ----- From: Serafina Mitchell, MD Sent: 12/08/2018   9:06 PM EDT To: [facility] Charge Pool  12/08/2018:  Surgeon:  [name] Assistants:  Delena Serve Procedure:   Branch ligation x5 of left radiocephalic fistula   Schedule patient for follow-up in 4 weeks with duplex of fistula to see me or PA

## 2019-01-09 ENCOUNTER — Encounter: Payer: Commercial Managed Care - PPO | Admitting: Surgery

## 2019-01-09 ENCOUNTER — Encounter (HOSPITAL_COMMUNITY): Payer: Commercial Managed Care - PPO

## 2019-01-16 ENCOUNTER — Other Ambulatory Visit: Payer: Self-pay

## 2019-01-16 DIAGNOSIS — Z992 Dependence on renal dialysis: Secondary | ICD-10-CM

## 2019-01-16 DIAGNOSIS — N186 End stage renal disease: Secondary | ICD-10-CM

## 2019-01-27 ENCOUNTER — Telehealth (HOSPITAL_COMMUNITY): Payer: Self-pay | Admitting: Rehabilitation

## 2019-01-27 NOTE — Telephone Encounter (Signed)
The above patient or their representative was contacted and gave the following answers to these questions:         Do you have any of the following symptoms? No  Fever                    Cough                   Shortness of breath  Do  you have any of the following other symptoms? No   muscle pain         vomiting,        diarrhea        rash         weakness        red eye        abdominal pain         bruising          bruising or bleeding              joint pain           severe headache    Have you been in contact with someone who was or has been sick in the past 2 weeks? No  Yes                 Unsure                         Unable to assess   Does the person that you were in contact with have any of the following symptoms?   Cough         shortness of breath           muscle pain         vomiting,            diarrhea            rash            weakness           fever            red eye           abdominal pain           bruising  or  bleeding                joint pain                severe headache               Have you  or someone you have been in contact with traveled internationally in th last month? No        If yes, which countries?   Have you  or someone you have been in contact with traveled outside [place] in th last month? No         If yes, which state and city?   COMMENTS OR ACTION PLAN FOR THIS PATIENT:  Pt was advised to wear mask to appt

## 2019-01-30 ENCOUNTER — Other Ambulatory Visit: Payer: Self-pay

## 2019-01-30 ENCOUNTER — Encounter: Payer: Self-pay | Admitting: Family

## 2019-01-30 ENCOUNTER — Ambulatory Visit (HOSPITAL_COMMUNITY)
Admission: RE | Admit: 2019-01-30 | Discharge: 2019-01-30 | Disposition: A | Discharge status: Home / Self Care | Payer: Commercial Managed Care - PPO | Source: Ambulatory Visit | Attending: Surgery | Admitting: Surgery

## 2019-01-30 ENCOUNTER — Ambulatory Visit (INDEPENDENT_AMBULATORY_CARE_PROVIDER_SITE_OTHER): Payer: Self-pay | Admitting: Family

## 2019-01-30 VITALS — BP 138/77 | HR 79 | Temp 97.0°F | Resp 16 | Ht 71.0 in | Wt 269.2 lb

## 2019-01-30 DIAGNOSIS — Z992 Dependence on renal dialysis: Secondary | ICD-10-CM | POA: Insufficient documentation

## 2019-01-30 DIAGNOSIS — I770 Arteriovenous fistula, acquired: Secondary | ICD-10-CM

## 2019-01-30 DIAGNOSIS — N186 End stage renal disease: Secondary | ICD-10-CM

## 2019-01-30 NOTE — Progress Notes (Signed)
CC: follow up ligation of multiple branches of AVF, ESRD on hemodialysis   History of Present Illness  Wesley [name] is a 58 y.o. (1960/10/04) male who is s/p branch ligation x5 of left radiocephalic fistula on 0-81-44 by Dr. Trula Slade. The patient has non-maturity of the left radiocephalic fistula.  He is undergone a duplex as well as a venogram which showed that branches were the most likely cause of non-maturation. After ligation of multiple venous branches through 3 separate incisions, the patient had a significant improvement and a thrill within the fistula.   He had creation of a left Radial Cephalic AV fistula on 81-8-56 by Dr. Oneida Alar.   He returns today for AVF duplex and exam.   He dialyzes M-W-F via right upper chest TDC at [facility] in [place].  Pt states he has had 2 HD sessions of one needle in his [facility] and one needle in the AVF.  At one session it sounds like there was an infiltration with swelling and ecchymosis, this has resolved.   He denies any steal type sx's in his left upper extremity.   This is his first permanent access placed.  He is right hand dominant, and also somewhat ambidexterous.  He loves to be around and in bodies of water: kayak, swim, canoeing. He used to do [employer].    Past Medical History:  Diagnosis Date  . Anasarca   . Anemia   . Atrial fibrillation ([place])    diagnosed 05/2018  . Cellulitis   . Coronary artery disease    a. cath in 06/2018 showing 80% D1 stenosis, 70% Proximal-LAD, 50% mid-RCA, 80% mid to distal-RCA, 100% distal RCA with left to right collaterals, and moderate pulmonary HTN. Medical management recommended given upcoming surgery and no recent angina.   . Diabetes mellitus without complication ([place])   . Hypertension   . LV dysfunction    LVEF 20-25% 06/28/18 echo  . Renal disorder     Social History Social History   Tobacco Use  . Smoking status: Never Smoker  . Smokeless tobacco: Never Used  Substance  Use Topics  . Alcohol use: Yes    Alcohol/week: 0.0 standard drinks    Comment: rarely  . Drug use: No    Family History Family History  Problem Relation Age of Onset  . Cancer - Lung Father   . Congestive Heart Failure Mother   . Diabetes Mellitus II Mother   . Cervical cancer Mother   . Dementia Mother     Surgical History Past Surgical History:  Procedure Laterality Date  . A/V FISTULAGRAM N/A 11/29/2018   Procedure: A/V FISTULAGRAM - left arm;  Surgeon: Serafina Mitchell, MD;  Location: [facility];  Service: Cardiovascular;  Laterality: N/A;  . AV FISTULA PLACEMENT Left 08/02/2018   Procedure: CREATION RADIOCEPHALIC ARTERIOVENOUS FISTULA LEFT ARM;  Surgeon: Elam Dutch, MD;  Location: [place];  Service: Vascular;  Laterality: Left;  . CATARACT EXTRACTION W/ INTRAOCULAR LENS  IMPLANT, BILATERAL    . FOOT SURGERY     right  . INCISION AND DRAINAGE OF WOUND  04/03/2012   Procedure: IRRIGATION AND DEBRIDEMENT WOUND;  Surgeon: Scherry Ran, MD;  Location: [facility];  Service: General;  Laterality: Right;  . IR FLUORO GUIDE CV LINE RIGHT  05/13/2018  . IR US GUIDE VASC ACCESS RIGHT  05/13/2018  . LIGATION OF ARTERIOVENOUS  FISTULA Left 12/08/2018   Procedure: BRANCH LIGATION OF LEFT RADIOCEPHALIC ARTERIOVENOUS FISTULA;  Surgeon: Serafina Mitchell, MD;  Location: [facility];  Service: Vascular;  Laterality: Left;  . RIGHT/LEFT HEART CATH AND CORONARY ANGIOGRAPHY N/A 07/05/2018   Procedure: RIGHT/LEFT HEART CATH AND CORONARY ANGIOGRAPHY;  Surgeon: Jettie Booze, MD;  Location: [place] [facility];  Service: Cardiovascular;  Laterality: N/A;    No Known Allergies  Current Outpatient Medications  Medication Sig Dispense Refill  . acetaminophen (TYLENOL) 500 MG tablet Take 500 mg by mouth every 6 (six) hours as needed (for pain.).    Marland Kitchen aspirin EC 81 MG tablet Take 1 tablet (81 mg total) by mouth daily. 90 tablet 3  . metoprolol succinate (TOPROL-XL) 50 MG 24 hr tablet Take 1  tablet (50 mg total) by mouth daily. Take with or immediately following a meal. 90 tablet 3  . Omega-3 Fatty Acids (FISH OIL) 1000 MG CAPS Take 1,000 mg by mouth daily.     . sevelamer carbonate (RENVELA) 800 MG tablet Take 2,400-3,200 mg by mouth 2 (two) times daily. Take 3-4 tablets (2400-3200 mg) by mouth 2 times daily with meals (dose varies based on size of meal)    . torsemide (DEMADEX) 20 MG tablet Take 3 tablets (60 mg total) by mouth daily. (Patient taking differently: Take 60 mg by mouth daily at 2 PM. ) 90 tablet 0   No current facility-administered medications for this visit.      REVIEW OF SYSTEMS: see HPI for pertinent positives and negatives    PHYSICAL EXAMINATION:  Vitals:   01/30/19 1431  BP: 138/77  Pulse: 79  Resp: 16  Temp: (!) 97 F (36.1 C)  TempSrc: Oral  SpO2: 91%  Weight: 269 lb 3.2 oz (122.1 kg)  Height: 5\' 11"  (1.803 m)   Body mass index is 37.55 kg/m.  General: Obese male appears his stated age.   HEENT:  No gross abnormalities. Pulmonary: Respirations are non-labored Abdomen: Soft and non-tender with normal bowel sounds. Musculoskeletal: There are no major deformities.   Neurologic: No focal weakness or paresthesias are detected, bilateral hand grasp strength is 5/5. Skin: There are no ulcer or rashes noted. Psychiatric: The patient has normal affect. Cardiovascular: There is a regular rate and rhythm with low grade murmur appreciated.  Bilateral radial pulses are 2+ palpable.  Left forearm AVF with palpable thrill and audible bruit. No swelling or ecchymosis.   Left forearm incisions are mostly healed, no signs of infection.    Non-Invasive Vascular Imaging  Left arm Access Duplex  (Date: 01/30/2019):  Findings: +--------------------+----------+-----------------+--------+ AVF                 PSV (cm/s)Flow Vol (mL/min)Comments +--------------------+----------+-----------------+--------+ Native artery inflow   108           115                 +--------------------+----------+-----------------+--------+ AVF Anastomosis        171                              +--------------------+----------+-----------------+--------+   +------------+----------+-------------+----------+--------+ OUTFLOW VEINPSV (cm/s)Diameter (cm)Depth (cm)Describe +------------+----------+-------------+----------+--------+ AC Fossa       148        0.78        0.38            +------------+----------+-------------+----------+--------+ Prox Forearm   125        0.68        0.56            +------------+----------+-------------+----------+--------+  Mid Forearm    251        0.62        0.47            +------------+----------+-------------+----------+--------+ Dist Forearm   177        0.48        0.37            +------------+----------+-------------+----------+--------+   Summary: Patent arteriovenous fistula.  Increased maximum diameters of the cephalic outflow vein when compared to the previous exam (11/14/2018) performed prior to branch ligation (12/08/2018).   Medical Decision Making  Wesley [name] is a 58 y.o. male who who is s/p branch ligation x5 of left radiocephalic fistula on 5-99-35 by Dr. Trula Slade. The patient has non-maturity of the left radiocephalic fistula.  He is undergone a duplex as well as a venogram which showed that branches were the most likely cause of non-maturation. After ligation of multiple venous branches through 3 separate incisions, the patient had a significant improvement and a thrill within the fistula.   He had creation of a left Radial Cephalic AV fistula on 70-1-77 by Dr. Oneida Alar.    Left Radial Cephalic AV fistula duplex today shows depth is shallow enough, and diameters are large enough to access, good flow throughout.   I spoke with Dr. Trula Slade, may access AVF one month after branch ligation, which was attempted last week.  When the [facility] is satisfied with the  function of the AVF, may remove TDC.   Follow up with [place] as needed.    Clemon Chambers, RN, MSN, FNP-C [facility] Office: [phone]  01/30/2019, 3:07 PM  Clinic MD: Trula Slade

## 2019-02-07 IMAGING — DX DG CHEST 2V
2 series · 2 of 2 positions shown · non-contrast
Comparison: December 17, 2017

CLINICAL DATA: Shortness of breath

EXAM:
CHEST - 2 VIEW

[chest pa]
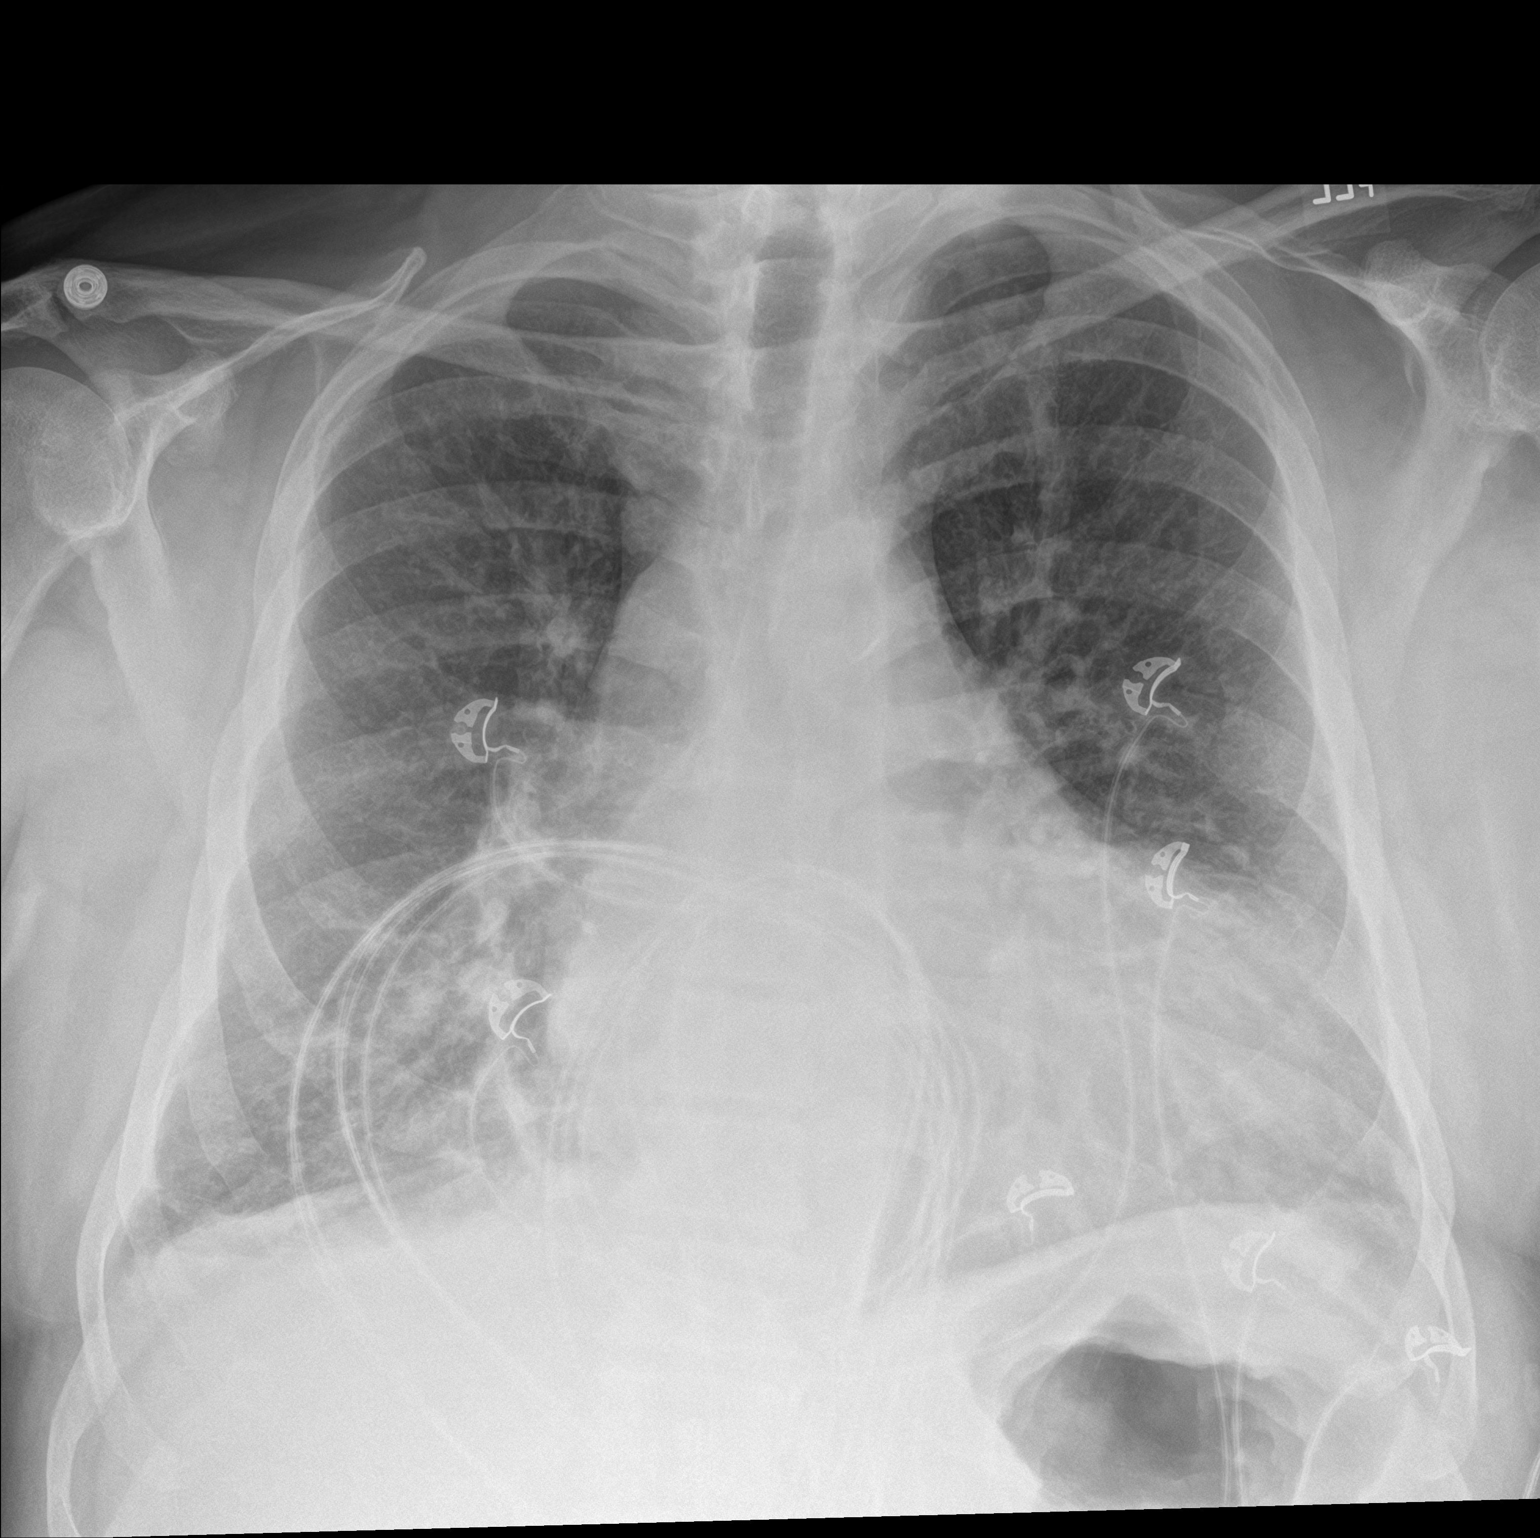

[chest lat]
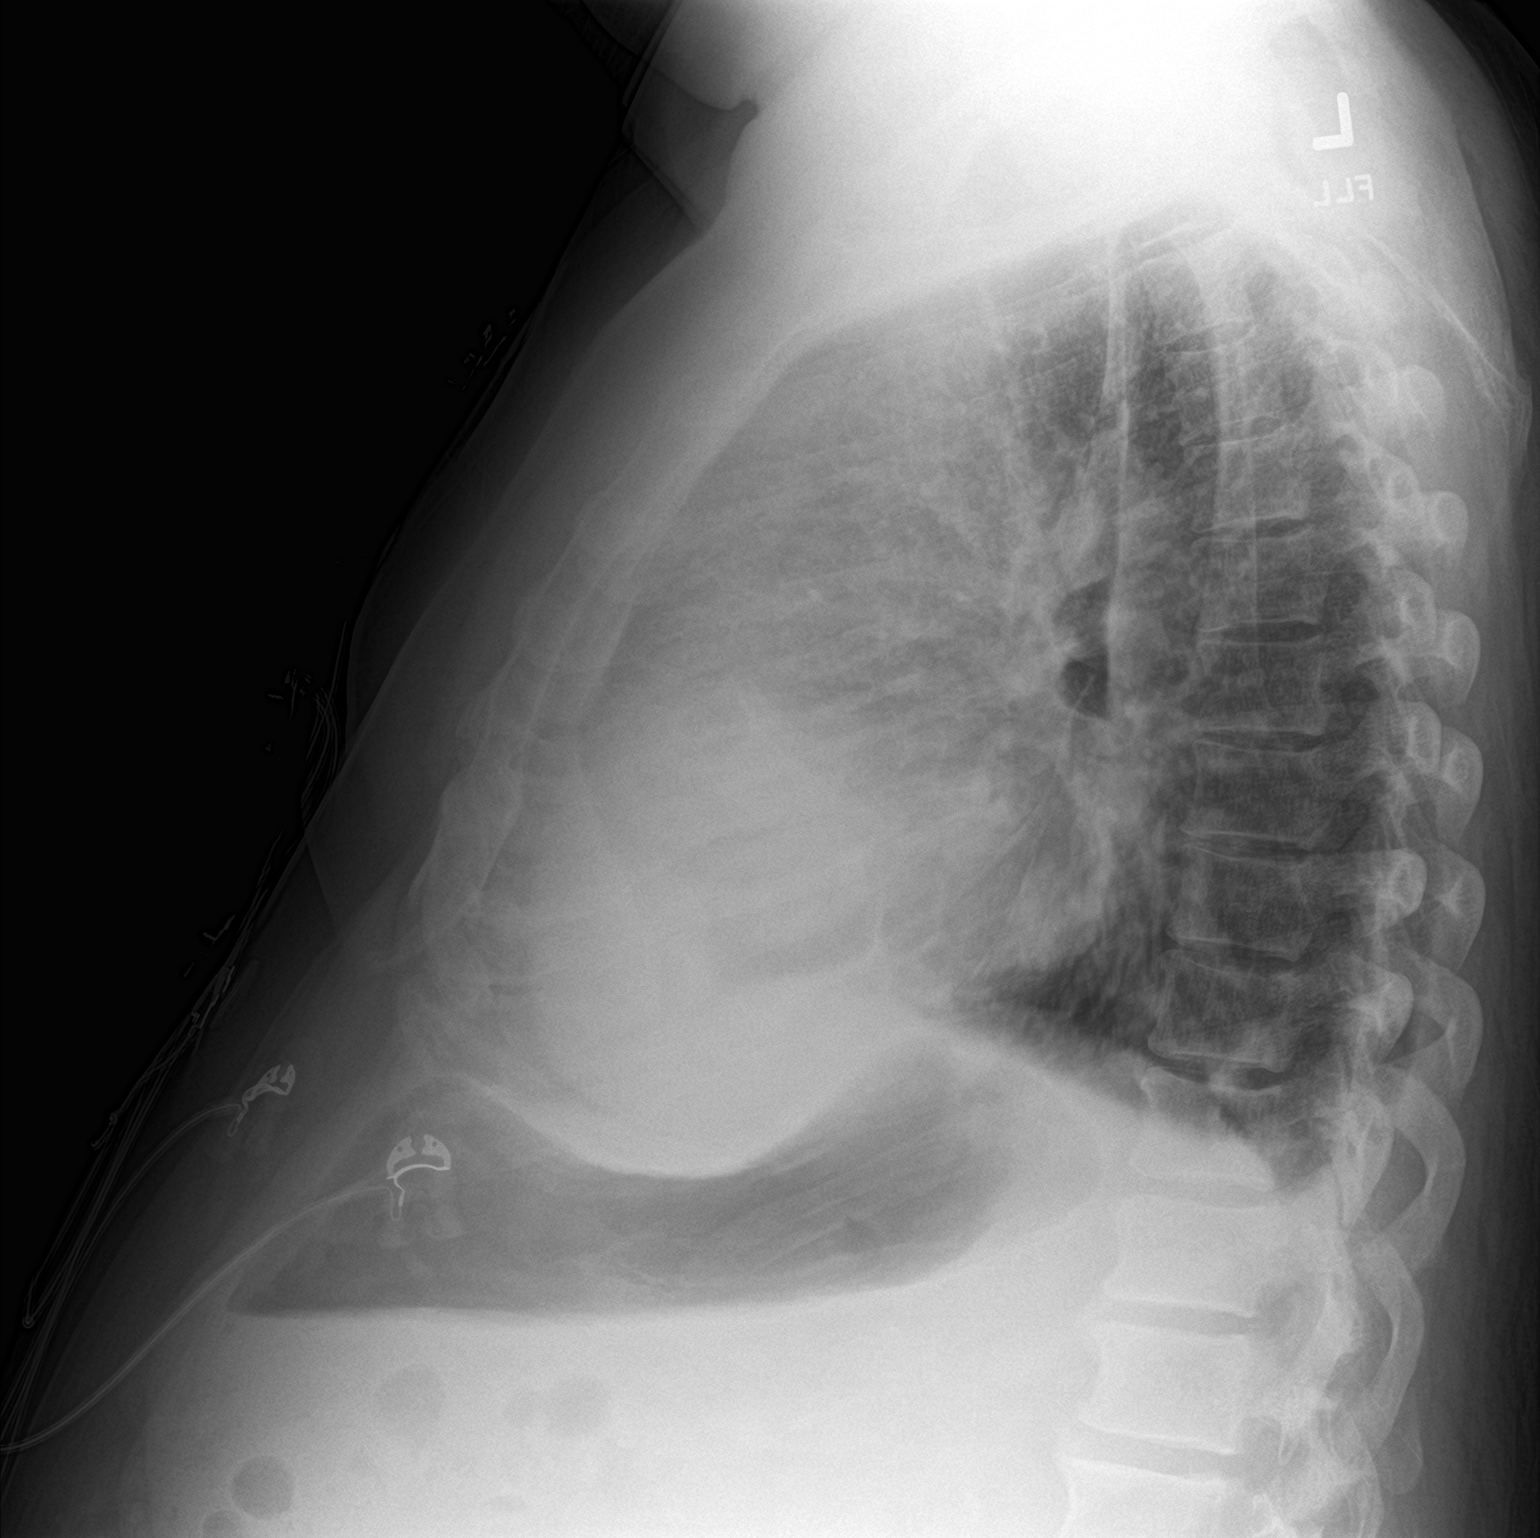

[2 of 2 positions shown; findings below may reference images not displayed]

FINDINGS: There is no appreciable edema or consolidation. There is
cardiomegaly with pulmonary venous hypertension. No adenopathy.
There is aortic atherosclerosis. No bone lesions.
IMPRESSION: Persistent pulmonary vascular congestion without frank edema or
consolidation. There is aortic atherosclerosis.

Aortic Atherosclerosis ([id number]).

## 2019-02-08 IMAGING — US IR FLUORO GUIDE CV LINE*R*
1 series · 2 of 2 positions shown · non-contrast
Comparison: none

INDICATION: 56-year-old with acute on chronic renal failure. Patient needs a
catheter to start hemodialysis.

[Series 1: ir fluoro guide cv line*right* · 2 of 2 slices shown]
[im 1/2]
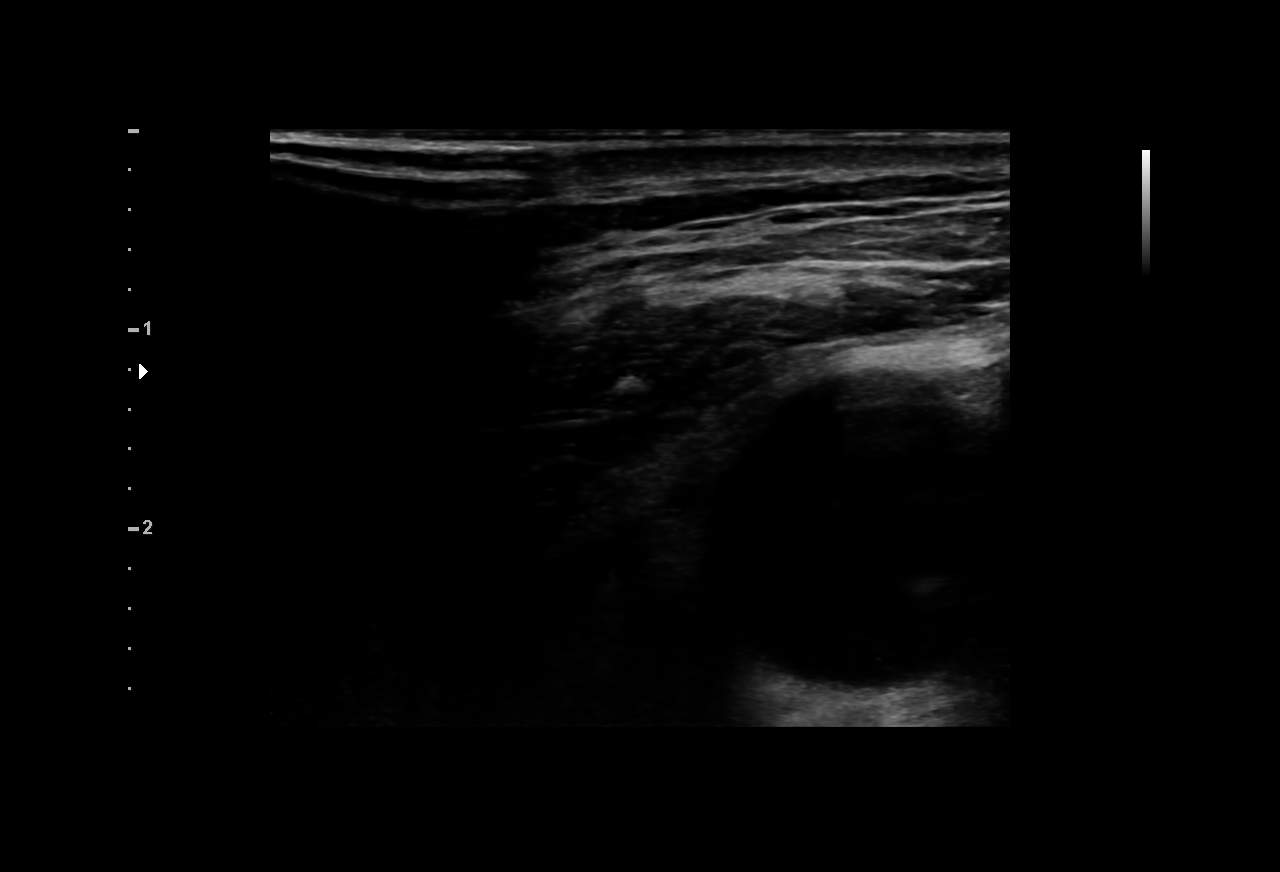
[im 2/2]
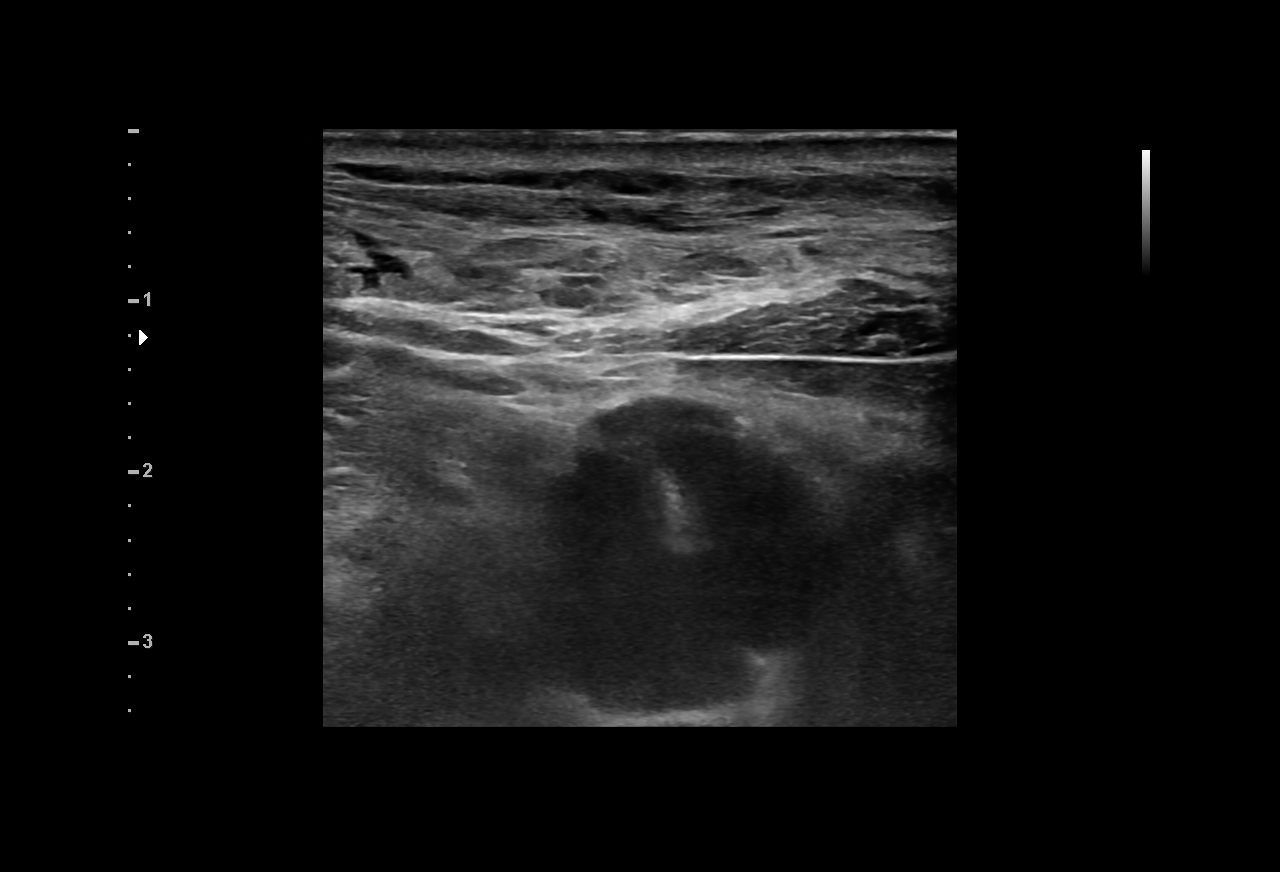

[2 of 2 positions shown; findings below may reference images not displayed]

EXAM:
FLUOROSCOPIC AND ULTRASOUND GUIDED PLACEMENT OF A TUNNELED DIALYSIS
CATHETER

MEDICATIONS:
Ancef 2 g; The antibiotic was administered within an appropriate
time interval prior to skin puncture.

ANESTHESIA/SEDATION:
Versed 1.0 mg IV; Fentanyl 50 mcg IV;

Moderate Sedation Time:  20 minutes

The patient was continuously monitored during the procedure by the
interventional radiology nurse under my direct supervision.

FLUOROSCOPY TIME:  Fluoroscopy Time: 1 minutes and 18 seconds, 14
mGy

COMPLICATIONS:
None immediate.

PROCEDURE:
Informed consent was obtained for placement of a tunneled dialysis
catheter. The patient was placed supine on the interventional table.
Ultrasound confirmed a patent right internal jugular vein.
Ultrasound images were obtained for documentation. The right side of
the neck was prepped and draped in a sterile fashion. The right side
of the neck was anesthetized with 1% lidocaine. Maximal barrier
sterile technique was utilized including caps, mask, sterile gowns,
sterile gloves, sterile drape, hand hygiene and skin antiseptic. A
small incision was made with #11 blade scalpel. A 21 gauge needle
directed into the right internal jugular vein with ultrasound
guidance. A micropuncture dilator set was placed. A 23 cm tip to
cuff Palindrome catheter was selected. The skin below the right
clavicle was anesthetized and a small incision was made with an #11
blade scalpel. A subcutaneous tunnel was formed to the vein
dermatotomy site. The catheter was brought through the tunnel. The
vein dermatotomy site was dilated to accommodate a peel-away sheath.
The catheter was placed through the peel-away sheath and directed
into the central venous structures. The tip of the catheter was
placed at the superior cavoatrial junction with fluoroscopy.
Fluoroscopic images were obtained for documentation. Both lumens
were found to aspirate and flush well. The proper amount of heparin
was flushed in both lumens. The vein dermatotomy site was closed
using a single layer of absorbable suture and Dermabond. Gel-Foam
was placed in the subcutaneous tract. The catheter was secured to
the skin using Prolene suture.
FINDINGS: Catheter tip at the superior cavoatrial junction.
IMPRESSION: Successful placement of a right jugular tunneled dialysis catheter
using ultrasound and fluoroscopic guidance.

## 2019-03-17 ENCOUNTER — Other Ambulatory Visit: Payer: Commercial Managed Care - PPO

## 2019-03-17 ENCOUNTER — Other Ambulatory Visit: Payer: Self-pay | Admitting: Internal Medicine

## 2019-03-17 DIAGNOSIS — Z20822 Contact with and (suspected) exposure to covid-19: Secondary | ICD-10-CM

## 2019-03-23 LAB — NOVEL CORONAVIRUS, NAA: SARS-CoV-2, NAA: NOT DETECTED

## 2019-03-27 ENCOUNTER — Telehealth: Payer: Self-pay | Admitting: Nurse Practitioner

## 2019-03-27 NOTE — Telephone Encounter (Signed)
Patient was notified that his COVID 19 test results where negative.

## 2019-06-08 ENCOUNTER — Telehealth (INDEPENDENT_AMBULATORY_CARE_PROVIDER_SITE_OTHER): Payer: Commercial Managed Care - PPO | Admitting: Cardiovascular Disease

## 2019-06-08 ENCOUNTER — Encounter: Payer: Self-pay | Admitting: Cardiovascular Disease

## 2019-06-08 VITALS — BP 120/70 | HR 80 | Ht 70.0 in | Wt 223.0 lb

## 2019-06-08 DIAGNOSIS — I5042 Chronic combined systolic (congestive) and diastolic (congestive) heart failure: Secondary | ICD-10-CM | POA: Diagnosis not present

## 2019-06-08 DIAGNOSIS — I25118 Atherosclerotic heart disease of native coronary artery with other forms of angina pectoris: Secondary | ICD-10-CM

## 2019-06-08 DIAGNOSIS — Z992 Dependence on renal dialysis: Secondary | ICD-10-CM

## 2019-06-08 DIAGNOSIS — E785 Hyperlipidemia, unspecified: Secondary | ICD-10-CM

## 2019-06-08 DIAGNOSIS — D649 Anemia, unspecified: Secondary | ICD-10-CM

## 2019-06-08 DIAGNOSIS — I4819 Other persistent atrial fibrillation: Secondary | ICD-10-CM

## 2019-06-08 DIAGNOSIS — N186 End stage renal disease: Secondary | ICD-10-CM

## 2019-06-08 DIAGNOSIS — I10 Essential (primary) hypertension: Secondary | ICD-10-CM

## 2019-06-08 NOTE — Progress Notes (Signed)
Virtual Visit via Telephone Note   This visit type was conducted due to national recommendations for restrictions regarding the COVID-19 Pandemic (e.g. social distancing) in an effort to limit this patient's exposure and mitigate transmission in our community.  Due to his co-morbid illnesses, this patient is at least at moderate risk for complications without adequate follow up.  This format is felt to be most appropriate for this patient at this time.  The patient did not have access to video technology/had technical difficulties with video requiring transitioning to audio format only (telephone).  All issues noted in this document were discussed and addressed.  No physical exam could be performed with this format.  Please refer to the patient's chart for his  consent to telehealth for [facility].   Date:  06/08/2019   ID:  Wesley [name], DOB 11-13-1960, MRN [id number]  Patient Location: Home Provider Location: Home  PCP:  Arsenio Katz, NP  Cardiologist:  Kate Sable, MD  Electrophysiologist:  None   Evaluation Performed:  Follow-Up Visit  Chief Complaint:  CHF  History of Present Illness:    Wesley [name] (prefers to be called Wesley [name]) is a 58 y.o. male with a history of chronic combined systolic and diastolic heart failure, significant CAD, medication noncompliance, persistent atrial fibrillation, and end stage renal disease on hemodialysis.  In summary, echocardiogram on 06/28/2018 demonstrated severely reduced left ventricular systolic function, LVEF 20 to 25%, with diffuse hypokinesis.  Cardiac catheterization was performed on 07/05/2018 and showed 80% D1 stenosis, 70% Proximal-LAD, 50% mid-RCA, 80% mid to distal-RCA, 100% distal RCA with left to right collaterals, and moderate pulmonary HTN. Continued medical therapy was recommended at that time given no recent anginal symptoms and in the setting of medication noncompliance and upcoming fistula  placement. Not felt to be an ideal CABG candidate given his renal dysfunction and cardiomyopathy but could consider PCI if he demonstrates medical compliance.   Chronic exertional dyspnea is stable and has actually improved. He denies chest pain. He's lost a considerable amount of weight and feels much better.  He recently bought a [employer] 3000 GT. He initially couldn't fit into it and this was what prompted him to decrease portion sizes and lose weight.  Because of COVID shutting buffets down, he was forced to eat less when going out to eat.  He is walking more and also kayaking.  The patient does not have symptoms concerning for COVID-19 infection (fever, chills, cough).   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  Past Medical History:  Diagnosis Date   Anasarca    Anemia    Atrial fibrillation ([place])    diagnosed 05/2018   Cellulitis    Coronary artery disease    a. cath in 06/2018 showing 80% D1 stenosis, 70% Proximal-LAD, 50% mid-RCA, 80% mid to distal-RCA, 100% distal RCA with left to right collaterals, and moderate pulmonary HTN. Medical management recommended given upcoming surgery and no recent angina.    Diabetes mellitus without complication ([place])    Hypertension    LV dysfunction    LVEF 20-25% 06/28/18 echo   Renal disorder    Past Surgical History:  Procedure Laterality Date   A/V FISTULAGRAM N/A 11/29/2018   Procedure: A/V FISTULAGRAM - left arm;  Surgeon: Serafina Mitchell, MD;  Location: [place] [facility];  Service: Cardiovascular;  Laterality: N/A;   AV FISTULA PLACEMENT Left 08/02/2018   Procedure:  CREATION RADIOCEPHALIC ARTERIOVENOUS FISTULA LEFT ARM;  Surgeon: Elam Dutch, MD;  Location: [facility] OR;  Service: Vascular;  Laterality: Left;   CATARACT EXTRACTION W/ INTRAOCULAR LENS  IMPLANT, BILATERAL     FOOT SURGERY     right   INCISION AND DRAINAGE OF WOUND  04/03/2012   Procedure:  IRRIGATION AND DEBRIDEMENT WOUND;  Surgeon: Scherry Ran, MD;  Location: [facility];  Service: General;  Laterality: Right;   IR FLUORO GUIDE CV LINE RIGHT  05/13/2018   IR US GUIDE VASC ACCESS RIGHT  05/13/2018   LIGATION OF ARTERIOVENOUS  FISTULA Left 12/08/2018   Procedure: BRANCH LIGATION OF LEFT RADIOCEPHALIC ARTERIOVENOUS FISTULA;  Surgeon: Serafina Mitchell, MD;  Location: [facility];  Service: Vascular;  Laterality: Left;   RIGHT/LEFT HEART CATH AND CORONARY ANGIOGRAPHY N/A 07/05/2018   Procedure: RIGHT/LEFT HEART CATH AND CORONARY ANGIOGRAPHY;  Surgeon: Jettie Booze, MD;  Location: [place] [facility];  Service: Cardiovascular;  Laterality: N/A;     Current Meds  Medication Sig   acetaminophen (TYLENOL) 500 MG tablet Take 500 mg by mouth every 6 (six) hours as needed (for pain.).   aspirin EC 81 MG tablet Take 1 tablet (81 mg total) by mouth daily.   Omega-3 Fatty Acids (FISH OIL) 1000 MG CAPS Take 1,000 mg by mouth daily.    sevelamer carbonate (RENVELA) 800 MG tablet Take 2,400-3,200 mg by mouth 2 (two) times daily. Take 3-4 tablets (2400-3200 mg) by mouth 2 times daily with meals (dose varies based on size of meal)   torsemide (DEMADEX) 20 MG tablet Take 3 tablets (60 mg total) by mouth daily. (Patient taking differently: Take 60 mg by mouth daily at 2 PM. )     Allergies:   Patient has no known allergies.   Social History   Tobacco Use   Smoking status: Never Smoker   Smokeless tobacco: Never Used  Substance Use Topics   Alcohol use: Yes    Alcohol/week: 0.0 standard drinks    Comment: rarely   Drug use: No     Family Hx: The patient's family history includes Cancer - Lung in his father; Cervical cancer in his mother; Congestive Heart Failure in his mother; Dementia in his mother; Diabetes Mellitus II in his mother.  ROS:   Please see the history of present illness.     All other systems reviewed and are negative.   Prior CV studies:   The following  studies were reviewed today:  See above  Labs/Other Tests and Data Reviewed:    EKG:  No ECG reviewed.  Recent Labs: 07/05/2018: BUN 41; Platelets 212 11/29/2018: Creatinine, Ser 6.30 12/08/2018: Hemoglobin 11.9; Potassium 4.7; Sodium 140   Recent Lipid Panel Lab Results  Component Value Date/Time   CHOL 127 09/13/2014 05:20 AM   TRIG 102 09/13/2014 05:20 AM   HDL 30 (L) 09/13/2014 05:20 AM   CHOLHDL 4.2 09/13/2014 05:20 AM   LDLCALC 77 09/13/2014 05:20 AM    Wt Readings from Last 3 Encounters:  06/08/19 223 lb (101.2 kg)  01/30/19 269 lb 3.2 oz (122.1 kg)  12/08/18 260 lb (117.9 kg)     Objective:    Vital Signs:  BP 120/70    Pulse 80    Ht 5\' 10"  (1.778 m)    Wt 223 lb (101.2 kg)    BMI 32.00 kg/m    VITAL SIGNS:  reviewed  ASSESSMENT & PLAN:    1.  Chronic combined systolic and diastolic  heart failure: NYHA class II symptoms.  LVEF 20 to 25% representing a mixed cardiomyopathy with both coronary artery disease and rapid atrial fibrillation contributing.  Volume management per hemodialysis.  He remains on torsemide 60 mg daily as per nephrology.  He is not taking Toprol-XL 50 mg daily as it led to low blood pressures and lightheadedness and it was stopped by nephrology. I am unable to add ACE inhibitors, angiotensin receptor blockers, or angiotensin receptor-neprilysin inhibitors due to advanced chronic kidney disease. Congratulated on weight loss.  2.  Coronary artery disease: Symptomatically stable.  Cardiac catheterization results reviewed above.  Continue aspirin. He is not taking Toprol-XL 50 mg daily as it led to low blood pressures and lightheadedness and it was stopped by nephrology. He previously declined statin therapy.  3.  Persistent atrial fibrillation: Heart rates reportedly controlled now off of Toprol-XL 50 mg daily. Stopped by nephrology due to symptomatic hypotension.  He is not on anticoagulation given his noncompliance and anemia. We talked again about  this but he declined anticoagulation.  Elevated thromboembolic risk previously discussed with the patient but he is adamant about avoiding anticoagulation therapy altogether.  4.  Hypertension: Blood pressure is normal.  He is not taking Toprol-XL 50 mg daily as it led to low blood pressures and lightheadedness and it was stopped by nephrology.  5.  Hyperlipidemia: He declines statin therapy and Zetia altogether. Congratulated on weight loss.  6.  Anemia: Likely due to end-stage renal disease.  Hemoglobin most recently 9.3 on 07/05/2018.  7.  End-stage renal disease: He dialyzes every Monday, Wednesday, and Friday.   COVID-19 Education: The signs and symptoms of COVID-19 were discussed with the patient and how to seek care for testing (follow up with PCP or arrange E-visit).  The importance of social distancing was discussed today.  Time:   Today, I have spent 15 minutes with the patient with telehealth technology discussing the above problems.     Medication Adjustments/Labs and Tests Ordered: Current medicines are reviewed at length with the patient today.  Concerns regarding medicines are outlined above.   Tests Ordered: No orders of the defined types were placed in this encounter.   Medication Changes: No orders of the defined types were placed in this encounter.   Follow Up:  Virtual Visit or In Person in 6 month(s)  Signed, Kate Sable, MD  06/08/2019 10:21 AM    [place]

## 2019-06-08 NOTE — Patient Instructions (Signed)
Medication Instructions: Your physician recommends that you continue on your current medications as directed. Please refer to the Current Medication list given to you today.   Labwork: None  Procedures/Testing: None  Follow-Up:  6 months "Virtual Visit" with Dr.Koneswaran  Any Additional Special Instructions Will Be Listed Below (If Applicable).     If you need a refill on your cardiac medications before your next appointment, please call your pharmacy.      Thank you for choosing [facility] !

## 2019-12-27 ENCOUNTER — Other Ambulatory Visit: Payer: Self-pay

## 2019-12-27 ENCOUNTER — Emergency Department (HOSPITAL_COMMUNITY): Payer: Commercial Managed Care - PPO

## 2019-12-27 ENCOUNTER — Inpatient Hospital Stay (HOSPITAL_COMMUNITY)
Admission: EM | Admit: 2019-12-27 | Discharge: 2019-12-30 | DRG: 314 | Disposition: A | Discharge status: Home / Self Care | Payer: Commercial Managed Care - PPO | Attending: Internal Medicine | Admitting: Internal Medicine

## 2019-12-27 ENCOUNTER — Encounter (HOSPITAL_COMMUNITY): Payer: Self-pay

## 2019-12-27 DIAGNOSIS — Z20822 Contact with and (suspected) exposure to covid-19: Secondary | ICD-10-CM | POA: Diagnosis present

## 2019-12-27 DIAGNOSIS — I5042 Chronic combined systolic (congestive) and diastolic (congestive) heart failure: Secondary | ICD-10-CM | POA: Diagnosis present

## 2019-12-27 DIAGNOSIS — Z6834 Body mass index (BMI) 34.0-34.9, adult: Secondary | ICD-10-CM | POA: Diagnosis not present

## 2019-12-27 DIAGNOSIS — Z79899 Other long term (current) drug therapy: Secondary | ICD-10-CM

## 2019-12-27 DIAGNOSIS — D631 Anemia in chronic kidney disease: Secondary | ICD-10-CM | POA: Diagnosis present

## 2019-12-27 DIAGNOSIS — I4891 Unspecified atrial fibrillation: Secondary | ICD-10-CM | POA: Diagnosis present

## 2019-12-27 DIAGNOSIS — Z66 Do not resuscitate: Secondary | ICD-10-CM | POA: Diagnosis present

## 2019-12-27 DIAGNOSIS — I272 Pulmonary hypertension, unspecified: Secondary | ICD-10-CM | POA: Diagnosis present

## 2019-12-27 DIAGNOSIS — I255 Ischemic cardiomyopathy: Secondary | ICD-10-CM | POA: Diagnosis present

## 2019-12-27 DIAGNOSIS — I2510 Atherosclerotic heart disease of native coronary artery without angina pectoris: Secondary | ICD-10-CM | POA: Diagnosis present

## 2019-12-27 DIAGNOSIS — I132 Hypertensive heart and chronic kidney disease with heart failure and with stage 5 chronic kidney disease, or end stage renal disease: Secondary | ICD-10-CM | POA: Diagnosis present

## 2019-12-27 DIAGNOSIS — N186 End stage renal disease: Secondary | ICD-10-CM

## 2019-12-27 DIAGNOSIS — I4819 Other persistent atrial fibrillation: Secondary | ICD-10-CM | POA: Diagnosis present

## 2019-12-27 DIAGNOSIS — Y832 Surgical operation with anastomosis, bypass or graft as the cause of abnormal reaction of the patient, or of later complication, without mention of misadventure at the time of the procedure: Secondary | ICD-10-CM | POA: Diagnosis present

## 2019-12-27 DIAGNOSIS — D696 Thrombocytopenia, unspecified: Secondary | ICD-10-CM | POA: Diagnosis present

## 2019-12-27 DIAGNOSIS — L03114 Cellulitis of left upper limb: Secondary | ICD-10-CM | POA: Diagnosis present

## 2019-12-27 DIAGNOSIS — T82868A Thrombosis of vascular prosthetic devices, implants and grafts, initial encounter: Principal | ICD-10-CM | POA: Diagnosis present

## 2019-12-27 DIAGNOSIS — T82898A Other specified complication of vascular prosthetic devices, implants and grafts, initial encounter: Secondary | ICD-10-CM

## 2019-12-27 DIAGNOSIS — E8889 Other specified metabolic disorders: Secondary | ICD-10-CM | POA: Diagnosis present

## 2019-12-27 DIAGNOSIS — Z992 Dependence on renal dialysis: Secondary | ICD-10-CM

## 2019-12-27 DIAGNOSIS — D72829 Elevated white blood cell count, unspecified: Secondary | ICD-10-CM

## 2019-12-27 DIAGNOSIS — Z9889 Other specified postprocedural states: Secondary | ICD-10-CM | POA: Diagnosis not present

## 2019-12-27 DIAGNOSIS — D539 Nutritional anemia, unspecified: Secondary | ICD-10-CM | POA: Diagnosis present

## 2019-12-27 DIAGNOSIS — I5020 Unspecified systolic (congestive) heart failure: Secondary | ICD-10-CM | POA: Diagnosis not present

## 2019-12-27 DIAGNOSIS — E8770 Fluid overload, unspecified: Secondary | ICD-10-CM

## 2019-12-27 DIAGNOSIS — E1122 Type 2 diabetes mellitus with diabetic chronic kidney disease: Secondary | ICD-10-CM | POA: Diagnosis present

## 2019-12-27 DIAGNOSIS — D62 Acute posthemorrhagic anemia: Secondary | ICD-10-CM | POA: Diagnosis not present

## 2019-12-27 DIAGNOSIS — E669 Obesity, unspecified: Secondary | ICD-10-CM | POA: Diagnosis present

## 2019-12-27 LAB — CBC WITH DIFFERENTIAL/PLATELET
Abs Immature Granulocytes: 0.22 10*3/uL — ABNORMAL HIGH (ref 0.00–0.07)
Basophils Absolute: 0 10*3/uL (ref 0.0–0.1)
Basophils Relative: 0 %
Eosinophils Absolute: 0 10*3/uL (ref 0.0–0.5)
Eosinophils Relative: 0 %
HCT: 24.1 % — ABNORMAL LOW (ref 39.0–52.0)
Hemoglobin: 7.7 g/dL — ABNORMAL LOW (ref 13.0–17.0)
Immature Granulocytes: 1 %
Lymphocytes Relative: 2 %
Lymphs Abs: 0.4 10*3/uL — ABNORMAL LOW (ref 0.7–4.0)
MCH: 34.2 pg — ABNORMAL HIGH (ref 26.0–34.0)
MCHC: 32 g/dL (ref 30.0–36.0)
MCV: 107.1 fL — ABNORMAL HIGH (ref 80.0–100.0)
Monocytes Absolute: 1.3 10*3/uL — ABNORMAL HIGH (ref 0.1–1.0)
Monocytes Relative: 7 %
Neutro Abs: 17.2 10*3/uL — ABNORMAL HIGH (ref 1.7–7.7)
Neutrophils Relative %: 90 %
Platelets: 150 10*3/uL (ref 150–400)
RBC: 2.25 MIL/uL — ABNORMAL LOW (ref 4.22–5.81)
RDW: 13.4 % (ref 11.5–15.5)
WBC: 19.1 10*3/uL — ABNORMAL HIGH (ref 4.0–10.5)
nRBC: 0 % (ref 0.0–0.2)

## 2019-12-27 LAB — HIV ANTIBODY (ROUTINE TESTING W REFLEX): HIV Screen 4th Generation wRfx: NONREACTIVE

## 2019-12-27 LAB — COMPREHENSIVE METABOLIC PANEL
ALT: 14 U/L (ref 0–44)
AST: 23 U/L (ref 15–41)
Albumin: 3.1 g/dL — ABNORMAL LOW (ref 3.5–5.0)
Alkaline Phosphatase: 72 U/L (ref 38–126)
Anion gap: 17 — ABNORMAL HIGH (ref 5–15)
BUN: 84 mg/dL — ABNORMAL HIGH (ref 6–20)
CO2: 20 mmol/L — ABNORMAL LOW (ref 22–32)
Calcium: 8.9 mg/dL (ref 8.9–10.3)
Chloride: 101 mmol/L (ref 98–111)
Creatinine, Ser: 10.88 mg/dL — ABNORMAL HIGH (ref 0.61–1.24)
GFR calc Af Amer: 5 mL/min — ABNORMAL LOW (ref >60)
GFR calc non Af Amer: 5 mL/min — ABNORMAL LOW (ref >60)
Glucose, Bld: 138 mg/dL — ABNORMAL HIGH (ref 70–99)
Potassium: 4.9 mmol/L (ref 3.5–5.1)
Sodium: 138 mmol/L (ref 135–145)
Total Bilirubin: 1 mg/dL (ref 0.3–1.2)
Total Protein: 7.2 g/dL (ref 6.5–8.1)

## 2019-12-27 LAB — HEMOGLOBIN A1C
Hgb A1c MFr Bld: 6.1 % — ABNORMAL HIGH (ref 4.8–5.6)
Mean Plasma Glucose: 128.37 mg/dL

## 2019-12-27 LAB — RESPIRATORY PANEL BY RT PCR (FLU A&B, COVID)
Influenza A by PCR: NEGATIVE
Influenza B by PCR: NEGATIVE
SARS Coronavirus 2 by RT PCR: NEGATIVE

## 2019-12-27 LAB — LACTIC ACID, PLASMA
Lactic Acid, Venous: 1.3 mmol/L (ref 0.5–1.9)
Lactic Acid, Venous: 1.8 mmol/L (ref 0.5–1.9)

## 2019-12-27 LAB — LIPASE, BLOOD: Lipase: 21 U/L (ref 11–51)

## 2019-12-27 LAB — POC OCCULT BLOOD, ED: Fecal Occult Bld: NEGATIVE

## 2019-12-27 MED ORDER — VANCOMYCIN HCL 10 G IV SOLR
2250.0000 mg | Freq: Once | INTRAVENOUS | Status: AC
  Administered 2019-12-27: 2250 mg via INTRAVENOUS
  Filled 2019-12-27: qty 2250

## 2019-12-27 MED ORDER — ACETAMINOPHEN 325 MG PO TABS
650.0000 mg | ORAL_TABLET | Freq: Once | ORAL | Status: AC
  Administered 2019-12-27: 650 mg via ORAL
  Filled 2019-12-27: qty 2

## 2019-12-27 MED ORDER — FUROSEMIDE 10 MG/ML IJ SOLN: 40.0000 mg | Freq: Once | INTRAMUSCULAR | Status: DC

## 2019-12-27 MED ORDER — CALCIUM ACETATE (PHOS BINDER) 667 MG PO CAPS
1334.0000 mg | ORAL_CAPSULE | ORAL | Status: DC
  Administered 2019-12-29 – 2019-12-30 (×2): 1334 mg via ORAL
  Filled 2019-12-27 (×2): qty 2

## 2019-12-27 MED ORDER — METOPROLOL TARTRATE 5 MG/5ML IV SOLN
5.0000 mg | Freq: Once | INTRAVENOUS | Status: AC
  Administered 2019-12-28: 5 mg via INTRAVENOUS
  Filled 2019-12-27: qty 5

## 2019-12-27 MED ORDER — VANCOMYCIN HCL IN DEXTROSE 1-5 GM/200ML-% IV SOLN
1000.0000 mg | Freq: Once | INTRAVENOUS | Status: DC
  Filled 2019-12-27: qty 200

## 2019-12-27 MED ORDER — SODIUM CHLORIDE 0.9 % IV SOLN
1.0000 g | INTRAVENOUS | Status: DC
  Administered 2019-12-28 – 2019-12-29 (×2): 1 g via INTRAVENOUS
  Filled 2019-12-27 (×3): qty 1

## 2019-12-27 MED ORDER — CHLORHEXIDINE GLUCONATE CLOTH 2 % EX PADS
6.0000 | MEDICATED_PAD | Freq: Every day | CUTANEOUS | Status: DC
  Administered 2019-12-28: 6 via TOPICAL

## 2019-12-27 MED ORDER — ACETAMINOPHEN 325 MG PO TABS: 650.0000 mg | ORAL_TABLET | Freq: Four times a day (QID) | ORAL | Status: DC | PRN

## 2019-12-27 MED ORDER — PROMETHAZINE HCL 25 MG PO TABS: 12.5000 mg | ORAL_TABLET | Freq: Four times a day (QID) | ORAL | Status: DC | PRN

## 2019-12-27 MED ORDER — CALCIUM ACETATE (PHOS BINDER) 667 MG PO CAPS: 1334.0000 mg | ORAL_CAPSULE | ORAL | Status: DC

## 2019-12-27 MED ORDER — FUROSEMIDE 10 MG/ML IJ SOLN
80.0000 mg | Freq: Once | INTRAMUSCULAR | Status: AC
  Administered 2019-12-27: 80 mg via INTRAVENOUS
  Filled 2019-12-27: qty 8

## 2019-12-27 MED ORDER — INSULIN ASPART 100 UNIT/ML ~~LOC~~ SOLN
0.0000 [IU] | Freq: Three times a day (TID) | SUBCUTANEOUS | Status: DC
  Administered 2019-12-29 – 2019-12-30 (×2): 2 [IU] via SUBCUTANEOUS

## 2019-12-27 MED ORDER — ACETAMINOPHEN 650 MG RE SUPP: 650.0000 mg | Freq: Four times a day (QID) | RECTAL | Status: DC | PRN

## 2019-12-27 MED ORDER — HEPARIN SODIUM (PORCINE) 5000 UNIT/ML IJ SOLN
5000.0000 [IU] | Freq: Three times a day (TID) | INTRAMUSCULAR | Status: AC
  Administered 2019-12-27 – 2019-12-28 (×4): 5000 [IU] via SUBCUTANEOUS
  Filled 2019-12-27 (×4): qty 1

## 2019-12-27 MED ORDER — SODIUM CHLORIDE 0.9 % IV SOLN
1.0000 g | Freq: Once | INTRAVENOUS | Status: AC
  Administered 2019-12-27: 1 g via INTRAVENOUS
  Filled 2019-12-27: qty 1

## 2019-12-27 MED ORDER — CALCIUM ACETATE (PHOS BINDER) 667 MG PO CAPS
2668.0000 mg | ORAL_CAPSULE | Freq: Three times a day (TID) | ORAL | Status: DC
  Administered 2019-12-28 – 2019-12-30 (×5): 2668 mg via ORAL
  Filled 2019-12-27 (×7): qty 4

## 2019-12-27 MED ORDER — SENNOSIDES-DOCUSATE SODIUM 8.6-50 MG PO TABS: 1.0000 | ORAL_TABLET | Freq: Every evening | ORAL | Status: DC | PRN

## 2019-12-27 NOTE — ED Provider Notes (Signed)
Attestation: Medical screening examination/treatment/procedure(s) were conducted as a shared visit with non-physician practitioner(s) and myself.  I personally evaluated the patient during the encounter.  Patient here from dialysis with fistula issue.  Patient had recent clot in his fistula in his left upper extremity.  However upon arrival patient appears to be in atrial fibrillation with RVR.  Has a low-grade temperature 100.2 orally but 99.5 rectally.  Per patient he was given antibiotics at dialysis today and sent here for evaluation for his fistula.  He has not had dialysis.  No history of atrial fibrillation but states that he has had it before when he had a lot of fluid on him.  He appears volume overloaded today.  However no significant respiratory distress.  Suspect that A. fib may be from sepsis or could be from volume overload.  Vascular surgery has been consulted to come evaluate fistula.  Patient will likely need admission given he likely needs sepsis work-up and needs definitive care to his fistula as he does appear to need dialysis given his A. fib.  We will hold off on any heart rate medication at this time as he is tolerating this well and this is likely due to either infectious or volume overload.  Lab work shows decreased hemoglobin but could be from fluid.  Patient per my physician assistant had dark brown stool on exam.  Broad-spectrum antibiotics have been given.  Patient to be admitted but care given to oncoming ED team.  Please see their note for further results, evaluation, disposition with patient.  Patient denies any cough, shortness of breath, abdominal pain.  Does still make some urine.  Will evaluate with blood cultures, urine studies.  Chest x-ray is consistent with volume overload.  No obvious infection.  EKG shows atrial fibrillation with RVR.  Marland KitchenCritical Care Performed by: Lennice Sites, DO Authorized by: Lennice Sites, DO   Critical care provider statement:    Critical  care time (minutes):  35   Critical care was necessary to treat or prevent imminent or life-threatening deterioration of the following conditions:  Sepsis and cardiac failure   Critical care was time spent personally by me on the following activities:  Blood draw for specimens, development of treatment plan with patient or surrogate, examination of patient, obtaining history from patient or surrogate, ordering and performing treatments and interventions, ordering and review of laboratory studies, ordering and review of radiographic studies, pulse oximetry, re-evaluation of patient's condition and review of old charts   I assumed direction of critical care for this patient from another provider in my specialty: no        Lennice Sites, DO 12/27/19 1612

## 2019-12-27 NOTE — H&P (Addendum)
Date: 12/27/2019               Patient Name:  Wesley [name] MRN: [id number]  DOB: 02-Jul-1961 Age / Sex: 59 y.o., male   PCP: Arsenio Katz, NP         Medical Service: Internal Medicine Teaching Service         Attending Physician: Dr. Velna Ochs, MD    First Contact: Dr. Marva Panda Pager: [phone]  Second Contact: Dr. Myrtie Hawk Pager: [phone]       After Hours (After 5p/  First Contact Pager: [phone]  weekends / holidays): Second Contact Pager: [phone]   Chief Complaint: Vascular Access  History of Present Illness:  Wesley [name] is a 59 y/o male with a PMH of atrial fibrillation, HTN, CAD, ESRD, who presents after having difficulty accessing his AV Fistula for dialysis. Patient states that on 12/22/2019, while at his dialysis session, his AVF developed a clot and he was instructed to [facility] on 12/25/2019. While at [facility], he underwent an angioplasty and stent placement and received dialysis that day. On 12/27/2019 he went back for dialysis treatment and initially the center could not access his fistula, but after a re- attempt, the site began to bleed. He was instructed to go the Emergency Room. Patient endorses feeling "feverish," but has no other complaints.  He denies headaches, vision changes, nausea/vomitting, palpitations, chest pain, abdominal pain/swelling, constipation/diarrhea, or lower extremity swelling.   ED Course: Patient was found to have leukocytosis (IHK74.2), with mild elevated temperature of 100.2 and recheck of 99.44F rectal. Patient was started on vancomycin and cefepime BCs pending.   Meds:  Current Meds  Medication Sig  . acetaminophen (TYLENOL) 500 MG tablet Take 500 mg by mouth every 6 (six) hours as needed (for pain.).  Marland Kitchen calcium acetate (PHOSLO) 667 MG capsule Take 2-4 capsules by mouth See admin instructions. Take 4 capsules with meals and 2 capsules with snacks.  . torsemide (DEMADEX) 20 MG tablet Take 3 tablets (60  mg total) by mouth daily. (Patient taking differently: Take 60-80 mg by mouth daily as needed (fluid). )     Allergies: Allergies as of 12/27/2019  . (No Known Allergies)   Past Medical History:  Diagnosis Date  . Anasarca   . Anemia   . Atrial fibrillation ([place])    diagnosed 05/2018  . Cellulitis   . Coronary artery disease    a. cath in 06/2018 showing 80% D1 stenosis, 70% Proximal-LAD, 50% mid-RCA, 80% mid to distal-RCA, 100% distal RCA with left to right collaterals, and moderate pulmonary HTN. Medical management recommended given upcoming surgery and no recent angina.   . Diabetes mellitus without complication ([place])   . Hypertension   . LV dysfunction    LVEF 20-25% 06/28/18 echo  . Renal disorder     Family History:  Family History  Problem Relation Age of Onset  . Cancer - Lung Father   . Congestive Heart Failure Mother   . Diabetes Mellitus II Mother   . Cervical cancer Mother   . Dementia Mother     Social History: Lives at home with wife. Enjoys hiking and kayaking.  Tobacco: Denies ETOH: 1 drink socially Substances: Denies illicit substances.   Review of Systems: A complete ROS was negative except as per HPI.  Physical Exam: Blood pressure (!) 141/65, pulse (!) 114, temperature 99.5 F (37.5 C), temperature source Rectal, resp. rate (!) 22, height 5' 10.5" (1.791 m), weight 104.3  kg, SpO2 98 %. Physical Exam Constitutional:      General: He is not in acute distress.    Appearance: Normal appearance.  HENT:     Head: Normocephalic and atraumatic.  Neck:     Comments: Elevated JVD Cardiovascular:     Rate and Rhythm: Tachycardia present. Rhythm irregular.  Musculoskeletal:        General: No tenderness or signs of injury.     Cervical back: No tenderness.     Comments: Trace pitting edema noted in the lower extremities bilaterally.   Skin:    General: Skin is warm.     Findings: No bruising, lesion or rash.     Comments: Erythema around the access  site in the L upper extremity. No signs of purulence or bleeding. Palpable thrill.   Neurological:     Mental Status: He is oriented to person, place, and time.  Psychiatric:        Mood and Affect: Mood normal.        Behavior: Behavior normal.     EKG: personally reviewed my interpretation is atrial fibrillation with RVR.   CXR: personally reviewed my interpretation is vascular congestion with trace effusions bilaterally.   Assessment & Plan by Problem: Active Problems:   Hemodialysis AV fistula thrombosis [facility])  [name] is a 59 y/o male with a PMH of A. Fib, HTN, CAD, HFrEF, ESRD, who is being admitted for likely AV fistula thrombosis.   L Radiocephalic AV Fistula Thrombosis:  Patient with AV fistula presents due to being unable to draw blood from AV fistula for his dialysis session.  - Vascular consulted, we appreciate their recommendations.  - Patient will attempt to undergo Dialysis 12/28/2019 to attempt if current AVF is adequate for dialysis. Possible surgical revision if current site is not adequate.   ESRD: Patient ESRD on a MWF schedule. Unable to complete W dialysis session. Signs of fluid overload on physical examination, trace pitting edema, elevated JVD, and congestion on the CXR. Patient still produces urine. Promotes dry weight of 102-103 kg.  - Nephrology consulted, we appreciate their recommendations.  - Dialysis 12/28/2019 - 80 mg IV Lasix Once. - Daily weights - I&O  Atrial Fibrillation with RVR:  A. Fib Hypervolemia v chronic issue. Patient not currently on rate/rhythm control or anticoagulation. Rate control due to low pressures after dialysis and weight loss, and refusing anticoagulation in the outpatient setting. Currently hemodynamically stable.  - IV Lasix 80mg  Once - Holding DOAC due to possible surgical revision pending results of dialysis treatment.  - Will hold rate control, continue to monitor patient HR while he diuresis.  - IV  Heparin  Leukocytosis:  Patient presenting with elevated WBC of 19.1, subjective fevers, and temperatures of 100.32F and 99.57F. erythema noted in LUE could be possible source. Patient is tachycardic, but this is likely 2/2 to A. Fib v volume overload.  - Blood cultures pending - Cefepime - Vancomycin  HFrEF:  Last echo 2019 EF 20-25% and showed moderate LVH, diffuse hypokinesis, and additionally underwent hearth catheterization which showed significant CAD. Patient shows signs of fluid overload likely 2/2 to being behind on dialysis sessions. R/O worsening of HF.  - Echo ordered.   Hypertension:  Vitals within normal limits 510-258 systolic.  - Continue to monitor  Macrocytic Anemia:  MCV 107 with drop in Hgb 7.7 from 11.9 a year ago. Patient is ESRD, which typically does not present as macrocytic anemia.  - B12  - Folate  Dispo: Admit patient to Inpatient with expected length of stay greater than 2 midnights.  Signed: Maudie Mercury, MD 12/27/2019, 9:59 PM

## 2019-12-27 NOTE — ED Notes (Signed)
X-ray at bedside

## 2019-12-27 NOTE — ED Provider Notes (Addendum)
3:51 PM Signout from [employer] at shift change. Sent in from vascular due to poor fistula function. Recent clot in fistula. Angioplasty and stent at [facility].   Here has afib with RVR, elevated WBC due to unclear cause, drop in hemoglobin from baseline chronic anemia.   Has been started on abx. Tolerating HR -- no rate control 2/2 concern for sepsis. Dark brown stool on rectal.   Will need admit, currently awaiting vascular reccs.   BP 125/71 (BP Location: Right Arm)   Pulse (!) 120   Temp 99.5 F (37.5 C) (Rectal)   Resp 20   Ht 5' 10.5" (1.791 m)   Wt 104.3 kg   SpO2 97%   BMI 32.54 kg/m   7:01 PM Reviewed Dr. Luther Parody note. In agreement with admission for treatment, he will obtain additional information and recommends additional attempt at dialysis thru current fistula in the left forearm.  Patient states that he was unable to have dialysis last Friday, had 5 L off on Monday but was still several kilograms above his dry weight.  Unable to dialyze today.  He dialyzes in [place].  PCP is in [place], [place].  BP 101/66   Pulse (!) 122   Temp 99.5 F (37.5 C) (Rectal)   Resp 19   Ht 5' 10.5" (1.791 m)   Wt 104.3 kg   SpO2 98%   BMI 32.54 kg/m   7:32 PM I spoke with Dr. Maricela Bo of [facility] who will see patient. Will touch base with nephrology.   7:45 PM Spoke with Dr. Royce Macadamia of nephrology -- requested non-urgent consult for dialysis tomorrow.   BP 109/80   Pulse (!) 123   Temp 99.5 F (37.5 C) (Rectal)   Resp 16   Ht 5' 10.5" (1.791 m)   Wt 104.3 kg   SpO2 96%   BMI 32.54 kg/m       Carlisle Cater, Hershal Coria 12/27/19 1946    Quintella Reichert, MD 12/28/19 [phone]

## 2019-12-27 NOTE — Consult Note (Addendum)
Wesley [name] Renal Consultation Note  Requesting MD: Wesley Sites DO  Indication for Consultation:  ESRD  Chief complaint: fistula not working  HPI:  Wesley [name] is a 59 y.o. male with a history of end-stage renal disease on hemodialysis Monday Wednesday Friday at [facility], afib, CAD, DM, and HTN who presented to the ER after directed here by his dialysis unit after his fistula was not functional today.  The patient states he had angioplasty with [facility] recently and his fistula worked well on Monday, 3/29 however it was not able to be accessed today.  The patient does not know if this was due to the cannulator.  He states that he does feel that he has extra fluid but is not overtly short of breath.  He states that he has a run time of 4 hours and 15 minutes and he thinks his dry weight is 103 kg - states sometimes gets closer to 102 kg.  Vascular has seen the patient and would like to try accessing the fistula for dialysis to see if it works.  While here, the patient was noted to have A. fib with RVR as well as a leukocytosis.  He had a fever of 102 and has been getting antibiotics at HD per report.  He states he was previously on anti-hypertensives but these were stopped after his blood pressure dropped with HD.  He is being admitted to medicine.  Covid screen was negative by respiratory panel.  Point-of-care occult blood was negative.  Note that he does have anemia. Reportedly had 5 kg off last HD treatment on 3/29.  PMHx:   Past Medical History:  Diagnosis Date  . Anasarca   . Anemia   . Atrial fibrillation ([place])    diagnosed 05/2018  . Cellulitis   . Coronary artery disease    a. cath in 06/2018 showing 80% D1 stenosis, 70% Proximal-LAD, 50% mid-RCA, 80% mid to distal-RCA, 100% distal RCA with left to right collaterals, and moderate pulmonary HTN. Medical management recommended given upcoming surgery and no recent angina.   . Diabetes mellitus  without complication ([place])   . Hypertension   . LV dysfunction    LVEF 20-25% 06/28/18 echo  . Renal disorder     Past Surgical History:  Procedure Laterality Date  . A/V FISTULAGRAM N/A 11/29/2018   Procedure: A/V FISTULAGRAM - left arm;  Surgeon: Serafina Mitchell, MD;  Location: [place] [facility];  Service: Cardiovascular;  Laterality: N/A;  . AV FISTULA PLACEMENT Left 08/02/2018   Procedure: CREATION RADIOCEPHALIC ARTERIOVENOUS FISTULA LEFT ARM;  Surgeon: Elam Dutch, MD;  Location: [place];  Service: Vascular;  Laterality: Left;  . CATARACT EXTRACTION W/ INTRAOCULAR LENS  IMPLANT, BILATERAL    . FOOT SURGERY     right  . INCISION AND DRAINAGE OF WOUND  04/03/2012   Procedure: IRRIGATION AND DEBRIDEMENT WOUND;  Surgeon: Scherry Ran, MD;  Location: [facility];  Service: General;  Laterality: Right;  . IR FLUORO GUIDE CV LINE RIGHT  05/13/2018  . IR US GUIDE VASC ACCESS RIGHT  05/13/2018  . LIGATION OF ARTERIOVENOUS  FISTULA Left 12/08/2018   Procedure: BRANCH LIGATION OF LEFT RADIOCEPHALIC ARTERIOVENOUS FISTULA;  Surgeon: Serafina Mitchell, MD;  Location: [place];  Service: Vascular;  Laterality: Left;  . RIGHT/LEFT HEART CATH AND CORONARY ANGIOGRAPHY N/A 07/05/2018   Procedure: RIGHT/LEFT HEART CATH AND CORONARY ANGIOGRAPHY;  Surgeon: Jettie Booze, MD;  Location: [place] [facility];  Service: Cardiovascular;  Laterality: N/A;    Family Hx:  Family History  Problem Relation Age of Onset  . Cancer - Lung Father   . Congestive Heart Failure Mother   . Diabetes Mellitus II Mother   . Cervical cancer Mother   . Dementia Mother   denies family history of CKD   Social History:  reports that he has never smoked. He has never used smokeless tobacco. He reports current alcohol use. He reports that he does not use drugs.  Allergies: No Known Allergies  Medications: Prior to Admission medications   Medication Sig Start Date End Date Taking? Authorizing Provider  acetaminophen  (TYLENOL) 500 MG tablet Take 500 mg by mouth every 6 (six) hours as needed (for pain.).   Yes [provider]  calcium acetate (PHOSLO) 667 MG capsule Take 2-4 capsules by mouth See admin instructions. Take 4 capsules with meals and 2 capsules with snacks. 11/02/19  Yes [provider]  torsemide (DEMADEX) 20 MG tablet Take 3 tablets (60 mg total) by mouth daily. Patient taking differently: Take 60-80 mg by mouth daily as needed (fluid).  05/16/18  Yes Kathie Dike, MD    I have reviewed the patient's reported prior to admission and current medications.  Labs:  BMP Latest Ref Rng & Units 12/27/2019 12/08/2018 11/29/2018  Glucose 70 - 99 mg/dL 138(H) 100(H) 93  BUN 6 - 20 mg/dL 84(H) - -  Creatinine 0.61 - 1.24 mg/dL 10.88(H) - 6.30(H)  Sodium 135 - 145 mmol/L 138 140 140  Potassium 3.5 - 5.1 mmol/L 4.9 4.7 4.3  Chloride 98 - 111 mmol/L 101 - -  CO2 22 - 32 mmol/L 20(L) - -  Calcium 8.9 - 10.3 mg/dL 8.9 - -     ROS:  Pertinent items noted in HPI and remainder of comprehensive ROS otherwise negative.  Physical Exam: Vitals:   12/27/19 1830 12/27/19 1900  BP: 101/66 109/80  Pulse: (!) 122 (!) 123  Resp: 19 16  Temp:    SpO2: 98% 96%     General: Adult male in bed in no acute distress at rest HEENT:Normocephalic/atraumatic Eyes:  Extraocular movements intact sclera anicteric Neck: Supple trachea midline Heart: tachycardic to 134 on my exam then back to the 120's S1S2 no rub Lungs: Clear to auscultation bilaterally normal work of breathing on room air Abdomen: Distended/obese habitus nontender Extremities: Trace edema lower extremities Neuro: Alert and oriented x3 provides a history and follows commands Psych normal mood and affect Access left upper extremity AV fistula with a bruit and thrill note there is a stitch in place  Assessment/Plan: # End-stage renal disease on hemodialysis  - normally Monday Wednesday Friday schedule at [facility] - no  emergent indication for dialysis tonight  - will attempt dialysis tomorrow on 4/1 off schedule using his AV fistula - Day team will need to get his dialysis prescription tomorrow  # Afib with RVR  - control per primary team  - will need improved rate control prior to initiating dialysis  # Anemia of CKD  - Some component secondary to CKD.  Unsure of trends unknown dialysis prescription including ESA agent  # Leukocytosis noted low-grade fever.  Primary team is working  # HTN - UF with HD   # Metabolic bone disease - Outpatient medications not known.  Would continue binders  Claudia Desanctis 12/27/2019, 8:48 PM

## 2019-12-27 NOTE — ED Triage Notes (Signed)
Pt arrives POV for eval of issue w/ dialysis access to L FA. Pt states that there was an issue last week w/ a clot, but stent was placed. Today, they were unable to complete dialysis session. Here to see vasc. Surg.

## 2019-12-27 NOTE — Progress Notes (Signed)
Pharmacy Antibiotic Note  Wesley [name] is a 59 y.o. male admitted on 12/27/2019 with sepsis. Pharmacy has been consulted for vancomycin and cefepime dosing.  Pt is on HD-MWF but has a fistula issue. Vascular surgery has been consulted. Will need to follow-up with nephrology plans to determine antibiotic dosing.  WBC 19.1, Lactate 1.3, and PO temp of 100.2  Plan: Vancomycin 2250mg  IV x1 and will plan for 1,000mg  post HD once HD resumes -Target level 15-25 mcg/mL Cefepime 1g IV Q24h F/u HD plans per nephrology F/u clinical progress, c/s, de-escalation, and LOT  Height: 5' 10.5" (179.1 cm) Weight: 230 lb (104.3 kg) IBW/kg (Calculated) : 74.15  Temp (24hrs), Avg:99.9 F (37.7 C), Min:99.5 F (37.5 C), Max:100.2 F (37.9 C)  Recent Labs  Lab 12/27/19 1435 12/27/19 1514  WBC 19.1*  --   CREATININE 10.88*  --   LATICACIDVEN  --  1.3    Estimated Creatinine Clearance: 8.8 mL/min (A) (by C-G formula based on SCr of 10.88 mg/dL (H)).    No Known Allergies  Antimicrobials this admission: 3/31 vanc >>  3/31 cefepime >>   Dose adjustments this admission: N/A  Microbiology results: 3/31 BCx: sent 3/31 UCx: sent   Thank you for allowing pharmacy to be a part of this patient's care.  Kennon Holter, PharmD PGY1 Ambulatory Care Pharmacy Resident 12/27/2019 6:30 PM

## 2019-12-27 NOTE — ED Provider Notes (Addendum)
[facility] EMERGENCY DEPARTMENT Provider Note   CSN: [id number] Arrival date & time: 12/27/19  1154     History Chief Complaint  Patient presents with  . Vascular Access Problem    Wesley [name] is a 59 y.o. male.  HPI Patient presents to the emergency department with complaints that he has an issue with his dialysis catheter.  Patient states last week there is a clot that was occluding the catheter and the patient states that this was corrected last week.  Patient states that there is concerned that he may have another issue today and was sent here for evaluation by vascular surgery.  The patient did not complete any of his dialysis.  The patient has no complaints other than that at this time.  The patient denies chest pain, shortness of breath, headache,blurred vision, neck pain, fever, cough, weakness, numbness, dizziness, anorexia, edema, abdominal pain, nausea, vomiting, diarrhea, rash, back pain, dysuria, hematemesis, bloody stool, near syncope, or syncope.    Past Medical History:  Diagnosis Date  . Anasarca   . Anemia   . Atrial fibrillation ([place])    diagnosed 05/2018  . Cellulitis   . Coronary artery disease    a. cath in 06/2018 showing 80% D1 stenosis, 70% Proximal-LAD, 50% mid-RCA, 80% mid to distal-RCA, 100% distal RCA with left to right collaterals, and moderate pulmonary HTN. Medical management recommended given upcoming surgery and no recent angina.   . Diabetes mellitus without complication ([place])   . Hypertension   . LV dysfunction    LVEF 20-25% 06/28/18 echo  . Renal disorder     Patient Active Problem List   Diagnosis Date Noted  . ESRD on dialysis ([place]) 10/12/2018  . CKD (chronic kidney disease), stage IV ([place]) 05/12/2018  . SOB (shortness of breath) 05/12/2018  . NSTEMI (non-ST elevated myocardial infarction) ([place]) 05/12/2018  . Symptomatic anemia 05/12/2018  . Acute renal failure superimposed on stage 4 chronic kidney  disease ([place]) 12/18/2017  . Metabolic acidosis 27/78/2423  . Hypocalcemia 12/18/2017  . Nephrotic syndrome 09/16/2014  . Chronic diastolic CHF (congestive heart failure) ([place]) 09/16/2014  . Cellulitis, scrotum 09/16/2014  . History of diabetes mellitus, type II 09/12/2014  . Cellulitis 09/12/2014  . Anasarca 09/12/2014  . AKI (acute kidney injury) ([place]) 09/12/2014  . Heart failure ([place]) 09/12/2014  . Normocytic anemia symptomatic 09/12/2014  . Elevated brain natriuretic peptide (BNP) level   . Anemia of chronic disease 04/01/2012  . Cellulitis and abscess of foot 03/30/2012  . Diabetes mellitus ([place]) 03/30/2012  . HTN (hypertension) 03/30/2012  . Obesity 03/30/2012  . Leukocytosis 03/30/2012    Past Surgical History:  Procedure Laterality Date  . A/V FISTULAGRAM N/A 11/29/2018   Procedure: A/V FISTULAGRAM - left arm;  Surgeon: Serafina Mitchell, MD;  Location: [facility];  Service: Cardiovascular;  Laterality: N/A;  . AV FISTULA PLACEMENT Left 08/02/2018   Procedure: CREATION RADIOCEPHALIC ARTERIOVENOUS FISTULA LEFT ARM;  Surgeon: Elam Dutch, MD;  Location: [facility];  Service: Vascular;  Laterality: Left;  . CATARACT EXTRACTION W/ INTRAOCULAR LENS  IMPLANT, BILATERAL    . FOOT SURGERY     right  . INCISION AND DRAINAGE OF WOUND  04/03/2012   Procedure: IRRIGATION AND DEBRIDEMENT WOUND;  Surgeon: Scherry Ran, MD;  Location: [facility];  Service: General;  Laterality: Right;  . IR FLUORO GUIDE CV LINE RIGHT  05/13/2018  . IR US GUIDE VASC ACCESS RIGHT  05/13/2018  .  LIGATION OF ARTERIOVENOUS  FISTULA Left 12/08/2018   Procedure: BRANCH LIGATION OF LEFT RADIOCEPHALIC ARTERIOVENOUS FISTULA;  Surgeon: Serafina Mitchell, MD;  Location: [facility];  Service: Vascular;  Laterality: Left;  . RIGHT/LEFT HEART CATH AND CORONARY ANGIOGRAPHY N/A 07/05/2018   Procedure: RIGHT/LEFT HEART CATH AND CORONARY ANGIOGRAPHY;  Surgeon: Jettie Booze, MD;  Location: [facility];  Service:  Cardiovascular;  Laterality: N/A;       Family History  Problem Relation Age of Onset  . Cancer - Lung Father   . Congestive Heart Failure Mother   . Diabetes Mellitus II Mother   . Cervical cancer Mother   . Dementia Mother     Social History   Tobacco Use  . Smoking status: Never Smoker  . Smokeless tobacco: Never Used  Substance Use Topics  . Alcohol use: Yes    Alcohol/week: 0.0 standard drinks    Comment: rarely  . Drug use: No    Home Medications Prior to Admission medications   Medication Sig Start Date End Date Taking? Authorizing Provider  acetaminophen (TYLENOL) 500 MG tablet Take 500 mg by mouth every 6 (six) hours as needed (for pain.).   Yes [provider]  calcium acetate (PHOSLO) 667 MG capsule Take 2-4 capsules by mouth See admin instructions. Take 4 capsules with meals and 2 capsules with snacks. 11/02/19  Yes [provider]  torsemide (DEMADEX) 20 MG tablet Take 3 tablets (60 mg total) by mouth daily. Patient taking differently: Take 60-80 mg by mouth daily as needed (fluid).  05/16/18  Yes Kathie Dike, MD    Allergies    Patient has no known allergies.  Review of Systems   Review of Systems All other systems negative except as documented in the HPI. All pertinent positives and negatives as reviewed in the HPI. Physical Exam Updated Vital Signs BP 125/71 (BP Location: Right Arm)   Pulse (!) 126   Temp 100.2 F (37.9 C) (Oral)   Resp 20   Ht 5' 10.5" (1.791 m)   Wt 104.3 kg   SpO2 94%   BMI 32.54 kg/m   Physical Exam Vitals and nursing note reviewed.  Constitutional:      General: He is not in acute distress.    Appearance: He is well-developed.  HENT:     Head: Normocephalic and atraumatic.  Eyes:     Pupils: Pupils are equal, round, and reactive to light.  Cardiovascular:     Rate and Rhythm: Regular rhythm. Tachycardia present.     Heart sounds: Normal heart sounds. No murmur. No friction rub. No gallop.    Pulmonary:     Effort: Pulmonary effort is normal. No respiratory distress.     Breath sounds: Normal breath sounds. No wheezing.  Abdominal:     General: Bowel sounds are normal. There is no distension.     Palpations: Abdomen is soft.     Tenderness: There is no abdominal tenderness.  Musculoskeletal:     Cervical back: Normal range of motion and neck supple.  Skin:    General: Skin is warm and dry.     Capillary Refill: Capillary refill takes less than 2 seconds.     Findings: No erythema or rash.  Neurological:     Mental Status: He is alert and oriented to person, place, and time.     Motor: No abnormal muscle tone.     Coordination: Coordination normal.  Psychiatric:        Behavior:  Behavior normal.     ED Results / Procedures / Treatments   Labs (all labs ordered are listed, but only abnormal results are displayed) Labs Reviewed  COMPREHENSIVE METABOLIC PANEL - Abnormal; Notable for the following components:      Result Value   CO2 20 (*)    Glucose, Bld 138 (*)    BUN 84 (*)    Creatinine, Ser 10.88 (*)    Albumin 3.1 (*)    GFR calc non Af Amer 5 (*)    GFR calc Af Amer 5 (*)    Anion gap 17 (*)    All other components within normal limits  CBC WITH DIFFERENTIAL/PLATELET - Abnormal; Notable for the following components:   WBC 19.1 (*)    RBC 2.25 (*)    Hemoglobin 7.7 (*)    HCT 24.1 (*)    MCV 107.1 (*)    MCH 34.2 (*)    Neutro Abs 17.2 (*)    Lymphs Abs 0.4 (*)    Monocytes Absolute 1.3 (*)    Abs Immature Granulocytes 0.22 (*)    All other components within normal limits  CULTURE, BLOOD (ROUTINE X 2)  CULTURE, BLOOD (ROUTINE X 2)  LACTIC ACID, PLASMA  LACTIC ACID, PLASMA    EKG EKG Interpretation  Date/Time:  Wednesday December 27 2019 12:28:17 EDT Ventricular Rate:  137 PR Interval:    QRS Duration: 98 QT Interval:  318 QTC Calculation: 480 R Axis:   75 Text Interpretation: Atrial fibrillation with rapid ventricular response ST depression,  consider subendocardial injury Nonspecific T wave abnormality Abnormal ECG Confirmed by Lennice Sites [phone]) on 12/27/2019 2:33:51 PM   Radiology DG Chest Port 1 View  Result Date: 12/27/2019 CLINICAL DATA:  Dialysis fistula malfunction, short of breath EXAM: PORTABLE CHEST 1 VIEW COMPARISON:  05/12/2018 FINDINGS: Single frontal view of the chest demonstrates an enlarged cardiac silhouette, stable. There is central vascular congestion and mild diffuse interstitial prominence, with trace bilateral pleural effusions. No pneumothorax. IMPRESSION: 1. Findings consistent with mild fluid overload. Electronically Signed   By: Randa Ngo M.D.   On: 12/27/2019 14:55    Procedures Procedures (including critical care time)  Medications Ordered in ED Medications - No data to display  ED Course  I have reviewed the triage vital signs and the nursing notes.  Pertinent labs & imaging results that were available during my care of the patient were reviewed by me and considered in my medical decision making (see chart for details).    MDM Rules/Calculators/A&P                      I spoke with vascular surgery who will come and evaluate the patient.  He does have an elevated heart rate and a low-grade temp.  Patient has no complaints at this time.  The patient has been otherwise stable here in the emergency department. Final Clinical Impression(s) / ED Diagnoses Final diagnoses:  None    Rx / DC Orders ED Discharge Orders    None       Dalia Heading, PA-C 12/27/19 1524    Dalia Heading, PA-C 12/27/19 [place], [place], DO 12/27/19 1610

## 2019-12-27 NOTE — ED Notes (Signed)
ED Provider at bedside. 

## 2019-12-27 NOTE — Consult Note (Addendum)
Hospital Consult    Reason for Consult:  Dialysis access issue Requesting Physician:  [facility] MRN #:  [id number]  History of Present Illness: This is a 59 y.o. male male with a history of end-stage renal disease just who presents to the emergency department on the advice of his nephrology service regarding issues with his arteriovenous fistula.  He is status post left radiocephalic AV fistula creation by Dr. Trula Slade in 2019.  He was last seen in our office in March 2020 where he was evaluated with duplex sonography.  His fistula was mature and ready for use.  The patient is seen and examined in emergency department he is awake, alert and in no apparent distress.  He states he dialyzes on Mondays, Wednesdays and Fridays.  The accessing his fistula.  There is issues with clotting and his treatment ran for approximately 1 hour.  He was instructed to follow-up on Monday at the [facility] interventional office and he states he underwent angioplasty and stent placement.  Today at dialysis there was again trouble accessing his fistula along with bleeding.  He was unable to be treated.  Currently the patient denies shortness of breath but does report an episode of chills earlier this morning.  He states his dry weight is 105 kg.  Past medical history significant for coronary artery disease, diabetes mellitus, hypertension and atrial fibrillation.  Past Medical History:  Diagnosis Date  . Anasarca   . Anemia   . Atrial fibrillation ([place])    diagnosed 05/2018  . Cellulitis   . Coronary artery disease    a. cath in 06/2018 showing 80% D1 stenosis, 70% Proximal-LAD, 50% mid-RCA, 80% mid to distal-RCA, 100% distal RCA with left to right collaterals, and moderate pulmonary HTN. Medical management recommended given upcoming surgery and no recent angina.   . Diabetes mellitus without complication ([place])   . Hypertension   . LV dysfunction    LVEF 20-25% 06/28/18 echo  . Renal disorder     Past  Surgical History:  Procedure Laterality Date  . A/V FISTULAGRAM N/A 11/29/2018   Procedure: A/V FISTULAGRAM - left arm;  Surgeon: Serafina Mitchell, MD;  Location: [facility];  Service: Cardiovascular;  Laterality: N/A;  . AV FISTULA PLACEMENT Left 08/02/2018   Procedure: CREATION RADIOCEPHALIC ARTERIOVENOUS FISTULA LEFT ARM;  Surgeon: Elam Dutch, MD;  Location: [facility];  Service: Vascular;  Laterality: Left;  . CATARACT EXTRACTION W/ INTRAOCULAR LENS  IMPLANT, BILATERAL    . FOOT SURGERY     right  . INCISION AND DRAINAGE OF WOUND  04/03/2012   Procedure: IRRIGATION AND DEBRIDEMENT WOUND;  Surgeon: Scherry Ran, MD;  Location[facility];  Service: General;  Laterality: Right;  . IR FLUORO GUIDE CV LINE RIGHT  05/13/2018  . IR US GUIDE VASC ACCESS RIGHT  05/13/2018  . LIGATION OF ARTERIOVENOUS  FISTULA Left 12/08/2018   Procedure: BRANCH LIGATION OF LEFT RADIOCEPHALIC ARTERIOVENOUS FISTULA;  Surgeon: Serafina Mitchell, MD;  Location: [facility];  Service: Vascular;  Laterality: Left;  . RIGHT/LEFT HEART CATH AND CORONARY ANGIOGRAPHY N/A 07/05/2018   Procedure: RIGHT/LEFT HEART CATH AND CORONARY ANGIOGRAPHY;  Surgeon: Jettie Booze, MD;  Location: [facility];  Service: Cardiovascular;  Laterality: N/A;    No Known Allergies  Prior to Admission medications   Medication Sig Start Date End Date Taking? Authorizing Provider  acetaminophen (TYLENOL) 500 MG tablet Take 500 mg by mouth every 6 (six) hours as needed (for pain.).  Yes [provider]  calcium acetate (PHOSLO) 667 MG capsule Take 2-4 capsules by mouth See admin instructions. Take 4 capsules with meals and 2 capsules with snacks. 11/02/19  Yes [provider]  torsemide (DEMADEX) 20 MG tablet Take 3 tablets (60 mg total) by mouth daily. Patient taking differently: Take 60-80 mg by mouth daily as needed (fluid).  05/16/18  Yes Kathie Dike, MD    Social History   Socioeconomic History  . Marital  status: Married    Spouse name: Not on file  . Number of children: Not on file  . Years of education: Not on file  . Highest education level: Not on file  Occupational History  . Not on file  Tobacco Use  . Smoking status: Never Smoker  . Smokeless tobacco: Never Used  Substance and Sexual Activity  . Alcohol use: Yes    Alcohol/week: 0.0 standard drinks    Comment: rarely  . Drug use: No  . Sexual activity: Not on file  Other Topics Concern  . Not on file  Social History Narrative  . Not on file   Social Determinants of Health   Financial Resource Strain:   . Difficulty of Paying Living Expenses:   Food Insecurity:   . Worried About Charity fundraiser in the Last Year:   . Arboriculturist in the Last Year:   Transportation Needs:   . Film/video editor (Medical):   Marland Kitchen Lack of Transportation (Non-Medical):   Physical Activity:   . Days of Exercise per Week:   . Minutes of Exercise per Session:   Stress:   . Feeling of Stress :   Social Connections:   . Frequency of Communication with Friends and Family:   . Frequency of Social Gatherings with Friends and Family:   . Attends Religious Services:   . Active Member of Clubs or Organizations:   . Attends Archivist Meetings:   Marland Kitchen Marital Status:   Intimate Partner Violence:   . Fear of Current or Ex-Partner:   . Emotionally Abused:   Marland Kitchen Physically Abused:   . Sexually Abused:      Family History  Problem Relation Age of Onset  . Cancer - Lung Father   . Congestive Heart Failure Mother   . Diabetes Mellitus II Mother   . Cervical cancer Mother   . Dementia Mother     ROS: Otherwise negative unless mentioned in HPI  Physical Examination  Vitals:   12/27/19 1527 12/27/19 1603  BP:  119/76  Pulse: (!) 120 (!) 114  Resp: 20 20  Temp: 99.5 F (37.5 C)   SpO2: 97% 95%   Body mass index is 32.54 kg/m.  General:  WDWN in NAD Gait: Not observed HENT: WNL, normocephalic Pulmonary: normal  non-labored breathing, without Rales, rhonchi,  wheezing Cardiac: irregular, rapid rate Abdomen:  soft, NT/ND Skin: without rashes Extremities: Moves all extremities well.  Examination of the left upper extremity reveals small puncture sites without bleeding.  Sutures in the proximal site.  Mild erythema.  Tenderness to palpation.  He has a good bruit and thrill in his fistula. Musculoskeletal: no muscle wasting or atrophy  Neurologic: A&O X 3;  No focal weakness or paresthesias are detected; speech is fluent/normal Psychiatric:  The pt has Normal affect. Lymph:  Unremarkable  CBC    Component Value Date/Time   WBC 19.1 (H) 12/27/2019 1435   RBC 2.25 (L) 12/27/2019 1435   HGB 7.7 (L)  12/27/2019 1435   HCT 24.1 (L) 12/27/2019 1435   PLT 150 12/27/2019 1435   MCV 107.1 (H) 12/27/2019 1435   MCH 34.2 (H) 12/27/2019 1435   MCHC 32.0 12/27/2019 1435   RDW 13.4 12/27/2019 1435   LYMPHSABS 0.4 (L) 12/27/2019 1435   MONOABS 1.3 (H) 12/27/2019 1435   EOSABS 0.0 12/27/2019 1435   BASOSABS 0.0 12/27/2019 1435    BMET    Component Value Date/Time   NA 138 12/27/2019 1435   K 4.9 12/27/2019 1435   CL 101 12/27/2019 1435   CO2 20 (L) 12/27/2019 1435   GLUCOSE 138 (H) 12/27/2019 1435   BUN 84 (H) 12/27/2019 1435   CREATININE 10.88 (H) 12/27/2019 1435   CALCIUM 8.9 12/27/2019 1435   CALCIUM 8.4 (L) 05/04/2018 0749   GFRNONAA 5 (L) 12/27/2019 1435   GFRAA 5 (L) 12/27/2019 1435    COAGS: Lab Results  Component Value Date   INR 1.41 05/12/2018    ASSESSMENT/PLAN: This is a 59 y.o. male with history of end-stage renal disease on chronic intermittent hemodialysis via left forearm radiocephalic fistula.  He reports recent problems with accessing his fistula and fully treating during dialysis treatments.  Treated recently at [facility] with angioplasty and stent placement. We will need to obtain definitive information regarding his recent intervention to his fistula.  ED physician  notes indicate atrial fibrillation with rapid ventricular rate.  -Dr. Donnetta Hutching will assess and provide further recommendations.   Barbie Banner PA-C [facility] [phone] 12/27/2019  5:06 PM    I have examined the patient, reviewed and agree with above.  Unfortunately I am not able to obtain the treatment notes from [facility] from 5 days ago.  On physical exam, the patient does have a patent fistula with a good thrill.  The skin is intact.  I am not sure why there would be difficulty accessing this.  The patient states there is been a great deal of turnover in the staff.  I explained the only options would be further attempts at revision of this and would make this determination pending review of his recent treatment.  He does have a very nicely dilated left upper arm cephalic vein and could have conversion to upper arm cephalic vein fistula.  Explained that he would require catheter if this was done until the upper arm cephalic vein matured fully.  He wishes to defer and avoid a catheter when all possible.  I agree with admission for treatment of his fever and rapid ventricular response A. fib.  Would recommend accessing his fistula to determine if he has adequate dialysis via this.  If not would consider surgical revision or conversion to upper arm fistula.  He reports that a stent was placed in his fistula at the treatment 5 days ago at [facility].  Will obtain this information to help make appropriate decision  Curt Jews, MD 12/27/2019 6:31 PM

## 2019-12-28 ENCOUNTER — Other Ambulatory Visit (HOSPITAL_COMMUNITY): Payer: Medicare Other

## 2019-12-28 LAB — CBC
HCT: 23.2 % — ABNORMAL LOW (ref 39.0–52.0)
Hemoglobin: 7.3 g/dL — ABNORMAL LOW (ref 13.0–17.0)
MCH: 33.8 pg (ref 26.0–34.0)
MCHC: 31.5 g/dL (ref 30.0–36.0)
MCV: 107.4 fL — ABNORMAL HIGH (ref 80.0–100.0)
Platelets: 131 10*3/uL — ABNORMAL LOW (ref 150–400)
RBC: 2.16 MIL/uL — ABNORMAL LOW (ref 4.22–5.81)
RDW: 13.4 % (ref 11.5–15.5)
WBC: 12 10*3/uL — ABNORMAL HIGH (ref 4.0–10.5)
nRBC: 0 % (ref 0.0–0.2)

## 2019-12-28 LAB — BASIC METABOLIC PANEL
Anion gap: 17 — ABNORMAL HIGH (ref 5–15)
Anion gap: 13 (ref 5–15)
BUN: 96 mg/dL — ABNORMAL HIGH (ref 6–20)
BUN: 90 mg/dL — ABNORMAL HIGH (ref 6–20)
CO2: 21 mmol/L — ABNORMAL LOW (ref 22–32)
CO2: 24 mmol/L (ref 22–32)
Calcium: 8.4 mg/dL — ABNORMAL LOW (ref 8.9–10.3)
Calcium: 8.6 mg/dL — ABNORMAL LOW (ref 8.9–10.3)
Chloride: 100 mmol/L (ref 98–111)
Chloride: 100 mmol/L (ref 98–111)
Creatinine, Ser: 11.36 mg/dL — ABNORMAL HIGH (ref 0.61–1.24)
Creatinine, Ser: 11.48 mg/dL — ABNORMAL HIGH (ref 0.61–1.24)
GFR calc Af Amer: 5 mL/min — ABNORMAL LOW (ref >60)
GFR calc Af Amer: 5 mL/min — ABNORMAL LOW (ref >60)
GFR calc non Af Amer: 4 mL/min — ABNORMAL LOW (ref >60)
GFR calc non Af Amer: 4 mL/min — ABNORMAL LOW (ref >60)
Glucose, Bld: 138 mg/dL — ABNORMAL HIGH (ref 70–99)
Glucose, Bld: 131 mg/dL — ABNORMAL HIGH (ref 70–99)
Potassium: 5.3 mmol/L — ABNORMAL HIGH (ref 3.5–5.1)
Potassium: 4.5 mmol/L (ref 3.5–5.1)
Sodium: 138 mmol/L (ref 135–145)
Sodium: 137 mmol/L (ref 135–145)

## 2019-12-28 LAB — GLUCOSE, CAPILLARY
Glucose-Capillary: 108 mg/dL — ABNORMAL HIGH (ref 70–99)
Glucose-Capillary: 93 mg/dL (ref 70–99)
Glucose-Capillary: 131 mg/dL — ABNORMAL HIGH (ref 70–99)
Glucose-Capillary: 135 mg/dL — ABNORMAL HIGH (ref 70–99)

## 2019-12-28 LAB — IRON AND TIBC
Iron: 22 ug/dL — ABNORMAL LOW (ref 45–182)
Saturation Ratios: 9 % — ABNORMAL LOW (ref 17.9–39.5)
TIBC: 245 ug/dL — ABNORMAL LOW (ref 250–450)
UIBC: 223 ug/dL

## 2019-12-28 LAB — HEPATITIS B SURFACE ANTIGEN: Hepatitis B Surface Ag: NONREACTIVE

## 2019-12-28 LAB — HEPATITIS B SURFACE ANTIBODY,QUALITATIVE: Hep B S Ab: NONREACTIVE

## 2019-12-28 LAB — VITAMIN B12: Vitamin B-12: 306 pg/mL (ref 180–914)

## 2019-12-28 LAB — HEPATITIS B CORE ANTIBODY, TOTAL: Hep B Core Total Ab: NONREACTIVE

## 2019-12-28 MED ORDER — SODIUM ZIRCONIUM CYCLOSILICATE 10 G PO PACK
10.0000 g | PACK | Freq: Three times a day (TID) | ORAL | Status: AC
  Administered 2019-12-28 (×3): 10 g via ORAL
  Filled 2019-12-28 (×3): qty 1

## 2019-12-28 MED ORDER — CALCIUM GLUCONATE-NACL 1-0.675 GM/50ML-% IV SOLN: 1.0000 g | Freq: Once | INTRAVENOUS | Status: DC

## 2019-12-28 MED ORDER — SODIUM ZIRCONIUM CYCLOSILICATE 5 G PO PACK
5.0000 g | PACK | Freq: Once | ORAL | Status: DC
  Filled 2019-12-28: qty 1

## 2019-12-28 MED ORDER — SODIUM ZIRCONIUM CYCLOSILICATE 10 G PO PACK
10.0000 g | PACK | Freq: Once | ORAL | Status: AC
  Administered 2019-12-28: 10 g via ORAL
  Filled 2019-12-28: qty 1

## 2019-12-28 MED ORDER — VANCOMYCIN VARIABLE DOSE PER UNSTABLE RENAL FUNCTION (PHARMACIST DOSING): Status: DC

## 2019-12-28 MED ORDER — CHLORHEXIDINE GLUCONATE CLOTH 2 % EX PADS
6.0000 | MEDICATED_PAD | Freq: Every day | CUTANEOUS | Status: DC
  Administered 2019-12-28 – 2019-12-30 (×3): 6 via TOPICAL

## 2019-12-28 MED ORDER — METOPROLOL TARTRATE 25 MG PO TABS
25.0000 mg | ORAL_TABLET | Freq: Two times a day (BID) | ORAL | Status: AC
  Administered 2019-12-28: 25 mg via ORAL
  Filled 2019-12-28 (×2): qty 1

## 2019-12-28 MED ORDER — CALCIUM GLUCONATE-NACL 1-0.675 GM/50ML-% IV SOLN
1.0000 g | Freq: Once | INTRAVENOUS | Status: DC
  Filled 2019-12-28: qty 50

## 2019-12-28 MED ORDER — DARBEPOETIN ALFA 100 MCG/0.5ML IJ SOSY
100.0000 ug | PREFILLED_SYRINGE | INTRAMUSCULAR | Status: DC
  Administered 2019-12-29: 100 ug via INTRAVENOUS
  Filled 2019-12-28: qty 0.5

## 2019-12-28 NOTE — Progress Notes (Signed)
PT Cancellation Note  Patient Details Name: Wesley [name] MRN: [id number] DOB: [birth year]   Cancelled Treatment:    Reason Eval/Treat Not Completed: Patient at procedure or test/unavailable (HD). PT will continue to follow-up with pt acutely as available.    [place] 12/28/2019, 7:58 AM

## 2019-12-28 NOTE — Progress Notes (Addendum)
  Progress Note    12/28/2019 8:16 AM  Subjective:  Patient examined during HD treatment.  Per patient, cannulation went well this morning and he is not having any problems to this point with treatment.  He denies subjective fevers, chills.   Vitals:   12/28/19 0530 12/28/19 0800  BP: (!) 137/96 96/Wesley  Pulse: (!) 111 (!) 117  Resp: 19   Temp: 98 F (36.7 C)   SpO2: 96%    Physical Exam: Lungs:  Non labored Extremities:  L hand motor and sensation intact; mid forearm with some fullness and erythema however skin is intact with no overlying ulceration Neurologic: A&O  CBC    Component Value Date/Time   WBC 12.0 (H) 12/28/2019 0231   RBC 2.16 (L) 12/28/2019 0231   HGB 7.3 (L) 12/28/2019 0231   HCT 23.2 (L) 12/28/2019 0231   PLT 131 (L) 12/28/2019 0231   MCV 107.4 (H) 12/28/2019 0231   MCH 33.8 12/28/2019 0231   MCHC 31.5 12/28/2019 0231   RDW 13.4 12/28/2019 0231   LYMPHSABS 0.4 (L) 12/27/2019 1435   MONOABS 1.3 (H) 12/27/2019 1435   EOSABS 0.0 12/27/2019 1435   BASOSABS 0.0 12/27/2019 1435    BMET    Component Value Date/Time   NA 138 12/28/2019 0231   K 5.3 (H) 12/28/2019 0231   CL 100 12/28/2019 0231   CO2 21 (L) 12/28/2019 0231   GLUCOSE 138 (H) 12/28/2019 0231   BUN 96 (H) 12/28/2019 0231   CREATININE 11.36 (H) 12/28/2019 0231   CALCIUM 8.4 (L) 12/28/2019 0231   CALCIUM 8.4 (L) 05/04/2018 0749   GFRNONAA 4 (L) 12/28/2019 0231   GFRAA 5 (L) 12/28/2019 0231    INR    Component Value Date/Time   INR 1.41 05/12/2018 1306     Intake/Output Summary (Last 24 hours) at 12/28/2019 0816 Last data filed at 12/27/2019 1915 Gross per 24 hour  Intake 600 ml  Output --  Net 600 ml     Assessment/Plan:  59 y.o. [name] with ESRD on HD  L arm radiocephalic fistula running well on HD this morning Area of fullness and erythema mid forearm noted; no prosthetic material in arm so unlikely fistula infection Agree with IV antibiotics and following blood cultures for  now   Wesley Ligas, PA-C [facility] [phone] 12/28/2019 8:16 AM  I have examined the patient, reviewed and agree with above.  Initially had successful cannulization but then was unsuccessful in completing dialysis.  To have a tunnel catheter placed by interventional radiology.  In all likelihood will need a new left upper arm AV fistula creation.  Would request [facility] to obtain and provide results of intervention and fistulogram at [facility] last week.  This would help determine next step.  Wesley Jews, MD 12/28/2019 12:03 PM

## 2019-12-28 NOTE — Progress Notes (Signed)
[facility] ROUNDING NOTE   Subjective:   This is a 59 year old gentleman end-stage renal disease Monday Wednesday Friday with [facility] atrial fibrillation coronary arteries diabetes hypertension presents the emergency room for nonfunctioning fistula.  Attempts to run him to dialysis through his left arm radiocephalic fistula was unsuccessful.  He does have some area of fullness and erythema mid forearm but there is no prosthetic material I agree that this is unlike to be a fistula infection with continue IV antibiotics and blood cultures.  Appreciate assistance from vein and vascular surgery Dr. Curt Jews   We shall need to have a tunneled dialysis catheter placed for 12/28/2019.  Blood pressure 122/87 pulse 110 temperature 98.7 O2 sats 96% room air  Sodium 138 potassium 5.3 chloride 100 CO2 21 BUN 96 creatinine 11.36 glucose 138 calcium 8.4 WBC 12.0 hemoglobin 7.3 platelets 131  PhosLo 3 tablets 3 times daily with meals, insulin sliding scale, vancomycin per pharmacy, Maxipime every 24 hours   Objective:  Vital signs in last 24 hours:  Temp:  [98 F (36.7 C)-100.2 F (37.9 C)] 98 F (36.7 C) (04/01 0530) Pulse Rate:  [55-150] 117 (04/01 0800) Resp:  [16-22] 19 (04/01 0530) BP: (96-144)/(59-96) 96/66 (04/01 0800) SpO2:  [94 %-100 %] 96 % (04/01 0530) Weight:  [104.3 kg-108.1 kg] 108.1 kg (04/01 0048)  Weight change:  Filed Weights   12/27/19 1204 12/28/19 0048  Weight: 104.3 kg 108.1 kg    Intake/Output: I/O last 3 completed shifts: In: 600 [IV Piggyback:600] Out: -    Intake/Output this shift:  No intake/output data recorded. Pleasant gentleman nondistressed CVS- RRR no murmurs rubs or gallops RS- CTA no wheeze or rales ABD- BS present soft non-distended EXT-redness and swelling in forearm left tender healed eschar over fistula site   Basic Metabolic Panel: Recent Labs  Lab 12/27/19 1435 12/28/19 0231  NA 138 138  K 4.9 5.3*  CL 101 100   CO2 20* 21*  GLUCOSE 138* 138*  BUN 84* 96*  CREATININE 10.88* 11.36*  CALCIUM 8.9 8.4*    Liver Function Tests: Recent Labs  Lab 12/27/19 1435  AST 23  ALT 14  ALKPHOS 72  BILITOT 1.0  PROT 7.2  ALBUMIN 3.1*   Recent Labs  Lab 12/27/19 1435  LIPASE 21   No results for input(s): AMMONIA in the last 168 hours.  CBC: Recent Labs  Lab 12/27/19 1435 12/28/19 0231  WBC 19.1* 12.0*  NEUTROABS 17.2*  --   HGB 7.7* 7.3*  HCT 24.1* 23.2*  MCV 107.1* 107.4*  PLT 150 131*    Cardiac Enzymes: No results for input(s): CKTOTAL, CKMB, CKMBINDEX, TROPONINI in the last 168 hours.  BNP: Invalid input(s): POCBNP  CBG: Recent Labs  Lab 12/28/19 0624  GLUCAP 108*    Microbiology: Results for orders placed or performed during the hospital encounter of 12/27/19  Culture, blood (routine x 2)     Status: None (Preliminary result)   Collection Time: 12/27/19  3:27 PM   Specimen: BLOOD  Result Value Ref Range Status   Specimen Description BLOOD BLOOD RIGHT WRIST  Final   Special Requests   Final    BOTTLES DRAWN AEROBIC AND ANAEROBIC Blood Culture results may not be optimal due to an excessive volume of blood received in culture bottles   Culture   Final    NO GROWTH < 24 HOURS Performed at [facility], [place] [address], [place], [place] [address]    Report Status PENDING  Incomplete  Culture, blood (routine x 2)     Status: None (Preliminary result)   Collection Time: 12/27/19  3:48 PM   Specimen: BLOOD  Result Value Ref Range Status   Specimen Description BLOOD RIGHT ANTECUBITAL  Final   Special Requests   Final    BOTTLES DRAWN AEROBIC AND ANAEROBIC Blood Culture adequate volume   Culture   Final    NO GROWTH < 24 HOURS Performed at [facility], [place] [address], [place], [place] [address]    Report Status PENDING  Incomplete  Respiratory Panel by RT PCR (Flu A&B, Covid) - Nasopharyngeal Swab     Status: None   Collection Time: 12/27/19  4:25 PM    Specimen: Nasopharyngeal Swab  Result Value Ref Range Status   SARS Coronavirus 2 by RT PCR NEGATIVE NEGATIVE Final    Comment: (NOTE) SARS-CoV-2 target nucleic acids are NOT DETECTED. The SARS-CoV-2 RNA is generally detectable in upper respiratoy specimens during the acute phase of infection. The lowest concentration of SARS-CoV-2 viral copies this assay can detect is 131 copies/mL. A negative result does not preclude SARS-Cov-2 infection and should not be used as the sole basis for treatment or other patient management decisions. A negative result may occur with  improper specimen collection/handling, submission of specimen other than nasopharyngeal swab, presence of viral mutation(s) within the areas targeted by this assay, and inadequate number of viral copies (<131 copies/mL). A negative result must be combined with clinical observations, patient history, and epidemiological information. The expected result is Negative. Fact Sheet for Patients:  PinkCheek.be Fact Sheet for Healthcare Providers:  GravelBags.it This test is not yet ap proved or cleared by the [place] FDA and  has been authorized for detection and/or diagnosis of SARS-CoV-2 by FDA under an Emergency Use Authorization (EUA). This EUA will remain  in effect (meaning this test can be used) for the duration of the COVID-19 declaration under Section 564(b)(1) of the Act, 21 U.S.C. section 360bbb-3(b)(1), unless the authorization is terminated or revoked sooner.    Influenza A by PCR NEGATIVE NEGATIVE Final   Influenza B by PCR NEGATIVE NEGATIVE Final    Comment: (NOTE) The Xpert Xpress SARS-CoV-2/FLU/RSV assay is intended as an aid in  the diagnosis of influenza from Nasopharyngeal swab specimens and  should not be used as a sole basis for treatment. Nasal washings and  aspirates are unacceptable for Xpert Xpress SARS-CoV-2/FLU/RSV  testing. Fact Sheet  for Patients: PinkCheek.be Fact Sheet for Healthcare Providers: GravelBags.it This test is not yet approved or cleared by the [place] FDA and  has been authorized for detection and/or diagnosis of SARS-CoV-2 by  FDA under an Emergency Use Authorization (EUA). This EUA will remain  in effect (meaning this test can be used) for the duration of the  Covid-19 declaration under Section 564(b)(1) of the Act, 21  U.S.C. section 360bbb-3(b)(1), unless the authorization is  terminated or revoked. Performed at [facility], [place] [address] [place], [place] [address]     Coagulation Studies: No results for input(s): LABPROT, INR in the last 72 hours.  Urinalysis: No results for input(s): COLORURINE, LABSPEC, PHURINE, GLUCOSEU, HGBUR, BILIRUBINUR, KETONESUR, PROTEINUR, UROBILINOGEN, NITRITE, LEUKOCYTESUR in the last 72 hours.  Invalid input(s): APPERANCEUR    Imaging: DG Chest Port 1 View  Result Date: 12/27/2019 CLINICAL DATA:  Dialysis fistula malfunction, short of breath EXAM: PORTABLE CHEST 1 VIEW COMPARISON:  05/12/2018 FINDINGS: Single frontal view of the chest demonstrates an enlarged  cardiac silhouette, stable. There is central vascular congestion and mild diffuse interstitial prominence, with trace bilateral pleural effusions. No pneumothorax. IMPRESSION: 1. Findings consistent with mild fluid overload. Electronically Signed   By: Randa Ngo M.D.   On: 12/27/2019 14:55     Medications:   . ceFEPime (MAXIPIME) IV     . calcium acetate  2,668 mg Oral TID WC   And  . calcium acetate  1,334 mg Oral With snacks  . Chlorhexidine Gluconate Cloth  6 each Topical Q0600  . heparin  5,000 Units Subcutaneous Q8H  . insulin aspart  0-15 Units Subcutaneous TID WC  . sodium zirconium cyclosilicate  10 g Oral TID  . vancomycin variable dose per unstable renal function (pharmacist dosing)   Does not apply See admin  instructions   acetaminophen **OR** acetaminophen, promethazine, senna-docusate  Assessment/ Plan:   ESRD-Monday Wednesday Friday dialysis patient [facility] no emergent indications for dialysis.  Will attempt dialysis for 10/2019 will need placement of dialysis catheter.  I think a tunneled catheter will be fine his white count is only 12,000.  It does appear that he has an area of cellulitis affecting his left arm.  Appreciate assistance from vein and vascular surgery.  It does not look like this can be cannulated.  Clots of blood withdrawn from catheter on attempts of cannulation.  Patient will probably need fistulogram.  Will defer this to vein and vascular surgery  ANEMIA-we will check iron studies and administer ESA  MBD-continue binders will check PTH  HTN/VOL-does not appear to be volume overloaded.  Blood pressure appears to be controlled  ACCESS-nonfunctioning access with area of superficial cellulitis.  Will probably need fistulogram.  Have asked interventional radiology to go ahead and place a dialysis catheter.  LOS: [place] @TODAY @9 :17 AM

## 2019-12-28 NOTE — Progress Notes (Signed)
Patient fistula was red, warm and swelling when assessed, MD notified and she requested [place] to try to cannulate and and see if we can do some treatment but when cannulated the access was not running good and the access was pulling  clotts. MD notified again and he ordered to stop treatment .

## 2019-12-28 NOTE — Consult Note (Signed)
Chief Complaint: Patient was seen in consultation today for tunneled dialysis catheter placement Chief Complaint  Patient presents with  . Vascular Access Problem   at the request of Dr Justin Mend   Supervising Physician: Daryll Brod  Patient Status: [facility] - In-pt  History of Present Illness: Wesley [name] is a 59 y.o. male   ESRD Afib; CAD; HTN; DM Left arm fistula run unsuccessful this am Fistula is tender and some swelling noted clots pulled at dialysis--- Vein and Vascular seeing pt for these symptoms afeb Wbc 12  Dr Justin Mend note today:He does have some area of fullness and erythema mid forearm but there is no prosthetic material I agree that this is unlike to be a fistula infection with continue IV antibiotics and blood cultures.  Appreciate assistance from vein and vascular surgery Dr. Sherren Mocha Early  Requesting tunneled dialysis catheter for 4/2 am   Past Medical History:  Diagnosis Date  . Anasarca   . Anemia   . Atrial fibrillation ([place])    diagnosed 05/2018  . Cellulitis   . Coronary artery disease    a. cath in 06/2018 showing 80% D1 stenosis, 70% Proximal-LAD, 50% mid-RCA, 80% mid to distal-RCA, 100% distal RCA with left to right collaterals, and moderate pulmonary HTN. Medical management recommended given upcoming surgery and no recent angina.   . Diabetes mellitus without complication ([place])   . Hypertension   . LV dysfunction    LVEF 20-25% 06/28/18 echo  . Renal disorder     Past Surgical History:  Procedure Laterality Date  . A/V FISTULAGRAM N/A 11/29/2018   Procedure: A/V FISTULAGRAM - left arm;  Surgeon: Serafina Mitchell, MD;  Location: [facility];  Service: Cardiovascular;  Laterality: N/A;  . AV FISTULA PLACEMENT Left 08/02/2018   Procedure: CREATION RADIOCEPHALIC ARTERIOVENOUS FISTULA LEFT ARM;  Surgeon: Elam Dutch, MD;  Location: [facility];  Service: Vascular;  Laterality: Left;  . CATARACT EXTRACTION W/ INTRAOCULAR LENS  IMPLANT,  BILATERAL    . FOOT SURGERY     right  . INCISION AND DRAINAGE OF WOUND  04/03/2012   Procedure: IRRIGATION AND DEBRIDEMENT WOUND;  Surgeon: Scherry Ran, MD;  Location: [facility];  Service: General;  Laterality: Right;  . IR FLUORO GUIDE CV LINE RIGHT  05/13/2018  . IR US GUIDE VASC ACCESS RIGHT  05/13/2018  . LIGATION OF ARTERIOVENOUS  FISTULA Left 12/08/2018   Procedure: BRANCH LIGATION OF LEFT RADIOCEPHALIC ARTERIOVENOUS FISTULA;  Surgeon: Serafina Mitchell, MD;  Location: [facility];  Service: Vascular;  Laterality: Left;  . RIGHT/LEFT HEART CATH AND CORONARY ANGIOGRAPHY N/A 07/05/2018   Procedure: RIGHT/LEFT HEART CATH AND CORONARY ANGIOGRAPHY;  Surgeon: Jettie Booze, MD;  Location: [facility];  Service: Cardiovascular;  Laterality: N/A;    Allergies: Patient has no known allergies.  Medications: Prior to Admission medications   Medication Sig Start Date End Date Taking? Authorizing Provider  acetaminophen (TYLENOL) 500 MG tablet Take 500 mg by mouth every 6 (six) hours as needed (for pain.).   Yes [provider]  calcium acetate (PHOSLO) 667 MG capsule Take 2-4 capsules by mouth See admin instructions. Take 4 capsules with meals and 2 capsules with snacks. 11/02/19  Yes [provider]  torsemide (DEMADEX) 20 MG tablet Take 3 tablets (60 mg total) by mouth daily. Patient taking differently: Take 60-80 mg by mouth daily as needed (fluid).  05/16/18  Yes Kathie Dike, MD     Family History  Problem Relation Age of Onset  . Cancer - Lung Father   . Congestive Heart Failure Mother   . Diabetes Mellitus II Mother   . Cervical cancer Mother   . Dementia Mother     Social History   Socioeconomic History  . Marital status: Married    Spouse name: Not on file  . Number of children: Not on file  . Years of education: Not on file  . Highest education level: Not on file  Occupational History  . Not on file  Tobacco Use  . Smoking status: Never Smoker    . Smokeless tobacco: Never Used  Substance and Sexual Activity  . Alcohol use: Yes    Alcohol/week: 0.0 standard drinks    Comment: rarely  . Drug use: No  . Sexual activity: Not on file  Other Topics Concern  . Not on file  Social History Narrative  . Not on file   Social Determinants of Health   Financial Resource Strain:   . Difficulty of Paying Living Expenses:   Food Insecurity:   . Worried About Charity fundraiser in the Last Year:   . Arboriculturist in the Last Year:   Transportation Needs:   . Film/video editor (Medical):   Marland Kitchen Lack of Transportation (Non-Medical):   Physical Activity:   . Days of Exercise per Week:   . Minutes of Exercise per Session:   Stress:   . Feeling of Stress :   Social Connections:   . Frequency of Communication with Friends and Family:   . Frequency of Social Gatherings with Friends and Family:   . Attends Religious Services:   . Active Member of Clubs or Organizations:   . Attends Archivist Meetings:   Marland Kitchen Marital Status:     Review of Systems: A 12 point ROS discussed and pertinent positives are indicated in the HPI above.  All other systems are negative.  Review of Systems  Constitutional: Negative for activity change and fatigue.  Respiratory: Negative for cough and shortness of breath.   Cardiovascular: Negative for chest pain.  Gastrointestinal: Negative for abdominal pain.  Musculoskeletal: Negative for back pain and gait problem.  Neurological: Negative for weakness.  Psychiatric/Behavioral: Negative for behavioral problems and confusion.    Vital Signs: BP 124/67 (BP Location: Right Arm)   Pulse (!) 105   Temp 100 F (37.8 C) (Oral)   Resp (!) 23   Ht 5\' 10"  (1.778 m)   Wt 238 lb 8.6 oz (108.2 kg)   SpO2 96%   BMI 34.23 kg/m   Physical Exam Vitals reviewed.  Cardiovascular:     Rate and Rhythm: Normal rate and regular rhythm.     Heart sounds: Normal heart sounds.  Pulmonary:     Effort:  Pulmonary effort is normal.     Breath sounds: Normal breath sounds.  Abdominal:     Palpations: Abdomen is soft.  Musculoskeletal:        General: Normal range of motion.     Comments: Left arm fistula with some swelling noted Minimal redness Weak pulse  Skin:    General: Skin is warm and dry.  Neurological:     Mental Status: He is alert and oriented to person, place, and time.  Psychiatric:        Behavior: Behavior normal.        Thought Content: Thought content normal.        Judgment: Judgment normal.  Imaging: DG Chest Port 1 View  Result Date: 12/27/2019 CLINICAL DATA:  Dialysis fistula malfunction, short of breath EXAM: PORTABLE CHEST 1 VIEW COMPARISON:  05/12/2018 FINDINGS: Single frontal view of the chest demonstrates an enlarged cardiac silhouette, stable. There is central vascular congestion and mild diffuse interstitial prominence, with trace bilateral pleural effusions. No pneumothorax. IMPRESSION: 1. Findings consistent with mild fluid overload. Electronically Signed   By: Randa Ngo M.D.   On: 12/27/2019 14:55    Labs:  CBC: Recent Labs    12/27/19 1435 12/28/19 0231  WBC 19.1* 12.0*  HGB 7.7* 7.3*  HCT 24.1* 23.2*  PLT 150 131*    COAGS: No results for input(s): INR, APTT in the last 8760 hours.  BMP: Recent Labs    12/27/19 1435 12/28/19 0231  NA 138 138  K 4.9 5.3*  CL 101 100  CO2 20* 21*  GLUCOSE 138* 138*  BUN 84* 96*  CALCIUM 8.9 8.4*  CREATININE 10.88* 11.36*  GFRNONAA 5* 4*  GFRAA 5* 5*    LIVER FUNCTION TESTS: Recent Labs    12/27/19 1435  BILITOT 1.0  AST 23  ALT 14  ALKPHOS 72  PROT 7.2  ALBUMIN 3.1*    TUMOR MARKERS: No results for input(s): AFPTM, CEA, CA199, CHROMGRNA in the last 8760 hours.  Assessment and Plan:  Left arm dialysis fitula- unsuccessful dialysis today Some swelling and redness of arm pulling clots per RN--- Vein and Vascular MD seeing pt for these symptoms Request for tunneled  dialysis catheter placement Risks and benefits discussed with the patient including, but not limited to bleeding, infection, vascular injury, pneumothorax which may require chest tube placement, air embolism or even death  All of the patient's questions were answered, patient is agreeable to proceed. Consent signed and in chart.    Thank you for this interesting consult.  I greatly enjoyed meeting Wesley [name] and look forward to participating in their care.  A copy of this report was sent to the requesting provider on this date.  Electronically Signed: Lavonia Drafts, PA-C 12/28/2019, 10:54 AM   I spent a total of 20 Minutes    in face to face in clinical consultation, greater than 50% of which was counseling/coordinating care for tunneled dial;ysis catheter placement

## 2019-12-28 NOTE — Progress Notes (Signed)
Subjective: HD#1 Overnight, patient admitted with concerns of AV fistula thrombosis and A.fib with RVR.  This morning, patient assessed at bedside during HD. He notes feeling well overall and does not have any acute concerns. He notes that he did have a short dialysis session this morning before his fistula stopped working again. He denies any chest pain or shortness of breath at this time.   Objective:  Vital signs in last 24 hours: Vitals:   12/28/19 0729 12/28/19 0800 12/28/19 0837 12/28/19 1003  BP: 102/65 96/66  124/67  Pulse: (!) 114 (!) 117  (!) 105  Resp:      Temp:   98.9 F (37.2 C) 100 F (37.8 C)  TempSrc:    Oral  SpO2:    96%  Weight:      Height:       CBC Latest Ref Rng & Units 12/28/2019 12/27/2019 12/08/2018  WBC 4.0 - 10.5 K/uL 12.0(H) 19.1(H) -  Hemoglobin 13.0 - 17.0 g/dL 7.3(L) 7.7(L) 11.9(L)  Hematocrit 39.0 - 52.0 % 23.2(L) 24.1(L) 35.0(L)  Platelets 150 - 400 K/uL 131(L) 150 -   BMP Latest Ref Rng & Units 12/28/2019 12/27/2019 12/08/2018  Glucose 70 - 99 mg/dL 138(H) 138(H) 100(H)  BUN 6 - 20 mg/dL 96(H) 84(H) -  Creatinine 0.61 - 1.24 mg/dL 11.36(H) 10.88(H) -  Sodium 135 - 145 mmol/L 138 138 140  Potassium 3.5 - 5.1 mmol/L 5.3(H) 4.9 4.7  Chloride 98 - 111 mmol/L 100 101 -  CO2 22 - 32 mmol/L 21(L) 20(L) -  Calcium 8.9 - 10.3 mg/dL 8.4(L) 8.9 -   Physical Exam  Constitutional: He is oriented to person, place, and time and well-developed, well-nourished, and in no distress. No distress.  HENT:  Head: Normocephalic and atraumatic.  Cardiovascular: S1 normal, S2 normal and intact distal pulses. An irregularly irregular rhythm present. Tachycardia present.  No murmur heard. Pulmonary/Chest: Effort normal and breath sounds normal. No respiratory distress. He has no wheezes. He has no rales.  Abdominal: Soft. Bowel sounds are normal. He exhibits no distension. There is no abdominal tenderness.  Musculoskeletal:        General: No tenderness, deformity or  edema. Normal range of motion.  Neurological: He is alert and oriented to person, place, and time.  Skin: Skin is warm and dry. He is not diaphoretic. There is erythema.  L arm fistula with palpable thrill; surrounding erythema and warmth without tenderness to palpation   Assessment/Plan: [name] is a 59 year old community dwelling male with PMHx of A.fib, HFrEF (EF 20-25% in 2019), ESRD on HD, HTN and CAD admitted for AV fistula thrombosis.   AV Fistula thrombosis:  Patient with AV fistula presenting with inability to draw blood from AV fistula during dialysis session. This morning, patient had short dialysis session for ~1 hour after which fistula stopped working. L arm fistula does have palpable thrill with surrounding erythema and warmth without tenderness to palpation, concerning for possible infection given leukocytosis. However, as there is no prosthetic material in the arm, unlikely to be fistula infection  - Vascular surgery consulted, appreciate their recommendations - F/u blood cultures - IV cefepime 1g q24h - IV vancomycin per pharmacy dosing   ESRD on HD:  Patient with ESRD on MWF schedule but unable to be dialyzed on Wednesday. Dry weight 102-103kg. Currently 108kg after receiving IV Lasix 80mg  with only 200-375ml UOP and brief session of HD this AM.  - Nephrology consulted, appreciate their recommendations -  IR guided HD catheter placement 4/1 - Monitor and replete electrolytes prn - PhosLo 3 tablets tid with meals  Atrial fibrillation with RVR: Patient with history of A.fib not on rate/rhythm control at baseline or anticoagulation. Noted to be in A.fib with RVR in setting of hypervolemia. Patient given one dose of IV metoprolol 5mg  with some improvement; however, on examination this morning, noted to be back in A.fib with RVR with HR in 120s.  - Metoprolol tartrate 25mg  bid  - Will hold off on anticoagulation until after HD catheter placement   HFrEF:  CAD:    Echo in 2019 with EF 20-25% with diffuse hypokinesis. Patient does have signs of volume overload; however, suspect this is in setting of missed dialysis sessions vs worsening HFrEF. Patient not on any aspirin or plavix at home for CAD. Will hold off on adding these at the moment.  - Will hold off on Echo at this time  Macrocytic anemia:  - F/u B12 and folate   FEN/GI:  Diet: Renal w/128ml fluid restriction Electrolytes: Monitor and replete prn Fluids: None   DVT Prophylaxis: subQ heparin Code: DNR  Prior to Admission Living Arrangement: Home Anticipated Discharge Location: Home, pending PT/OT evaluation Barriers to Discharge: Ongoing medical evaluation and treatment  Dispo: Anticipated discharge in approximately 2-3 day(s).   Harvie Heck, MD  Internal Medicine, PGY-1 12/28/2019, 10:17 AM Pager: [phone]

## 2019-12-28 NOTE — Progress Notes (Signed)
OT Cancellation Note  Patient Details Name: Wesley [name] MRN: [id number] DOB: 1960/11/18   Cancelled Treatment:    Reason Eval/Treat Not Completed: Patient at procedure or test/ unavailable- pt in HD. Will follow and see as able.   Jolaine Artist, OT Acute Rehabilitation Services Pager [phone] Office [phone]   [name] 12/28/2019, 7:58 AM

## 2019-12-29 ENCOUNTER — Inpatient Hospital Stay (HOSPITAL_COMMUNITY): Payer: Commercial Managed Care - PPO

## 2019-12-29 DIAGNOSIS — D539 Nutritional anemia, unspecified: Secondary | ICD-10-CM

## 2019-12-29 DIAGNOSIS — I132 Hypertensive heart and chronic kidney disease with heart failure and with stage 5 chronic kidney disease, or end stage renal disease: Secondary | ICD-10-CM

## 2019-12-29 DIAGNOSIS — T82868A Thrombosis of vascular prosthetic devices, implants and grafts, initial encounter: Principal | ICD-10-CM

## 2019-12-29 DIAGNOSIS — Z79899 Other long term (current) drug therapy: Secondary | ICD-10-CM

## 2019-12-29 DIAGNOSIS — I5020 Unspecified systolic (congestive) heart failure: Secondary | ICD-10-CM

## 2019-12-29 DIAGNOSIS — I4891 Unspecified atrial fibrillation: Secondary | ICD-10-CM

## 2019-12-29 DIAGNOSIS — D696 Thrombocytopenia, unspecified: Secondary | ICD-10-CM

## 2019-12-29 DIAGNOSIS — Z992 Dependence on renal dialysis: Secondary | ICD-10-CM

## 2019-12-29 DIAGNOSIS — Z66 Do not resuscitate: Secondary | ICD-10-CM

## 2019-12-29 DIAGNOSIS — I2510 Atherosclerotic heart disease of native coronary artery without angina pectoris: Secondary | ICD-10-CM

## 2019-12-29 DIAGNOSIS — N186 End stage renal disease: Secondary | ICD-10-CM

## 2019-12-29 DIAGNOSIS — D62 Acute posthemorrhagic anemia: Secondary | ICD-10-CM

## 2019-12-29 HISTORY — PX: IR FLUORO GUIDE CV LINE LEFT: IMG2282

## 2019-12-29 HISTORY — PX: IR US GUIDE VASC ACCESS RIGHT: IMG2390

## 2019-12-29 LAB — CBC
HCT: 20.9 % — ABNORMAL LOW (ref 39.0–52.0)
HCT: 24.3 % — ABNORMAL LOW (ref 39.0–52.0)
Hemoglobin: 6.6 g/dL — CL (ref 13.0–17.0)
Hemoglobin: 8 g/dL — ABNORMAL LOW (ref 13.0–17.0)
MCH: 34 pg (ref 26.0–34.0)
MCH: 33.8 pg (ref 26.0–34.0)
MCHC: 31.6 g/dL (ref 30.0–36.0)
MCHC: 32.9 g/dL (ref 30.0–36.0)
MCV: 107.7 fL — ABNORMAL HIGH (ref 80.0–100.0)
MCV: 102.5 fL — ABNORMAL HIGH (ref 80.0–100.0)
Platelets: 124 10*3/uL — ABNORMAL LOW (ref 150–400)
Platelets: 128 10*3/uL — ABNORMAL LOW (ref 150–400)
RBC: 1.94 MIL/uL — ABNORMAL LOW (ref 4.22–5.81)
RBC: 2.37 MIL/uL — ABNORMAL LOW (ref 4.22–5.81)
RDW: 13.2 % (ref 11.5–15.5)
RDW: 13.3 % (ref 11.5–15.5)
WBC: 8 10*3/uL (ref 4.0–10.5)
WBC: 6.7 10*3/uL (ref 4.0–10.5)
nRBC: 0 % (ref 0.0–0.2)
nRBC: 0.4 % — ABNORMAL HIGH (ref 0.0–0.2)

## 2019-12-29 LAB — GLUCOSE, CAPILLARY
Glucose-Capillary: 100 mg/dL — ABNORMAL HIGH (ref 70–99)
Glucose-Capillary: 139 mg/dL — ABNORMAL HIGH (ref 70–99)
Glucose-Capillary: 133 mg/dL — ABNORMAL HIGH (ref 70–99)

## 2019-12-29 LAB — COMPREHENSIVE METABOLIC PANEL
ALT: 27 U/L (ref 0–44)
AST: 65 U/L — ABNORMAL HIGH (ref 15–41)
Albumin: 2.5 g/dL — ABNORMAL LOW (ref 3.5–5.0)
Alkaline Phosphatase: 58 U/L (ref 38–126)
Anion gap: 18 — ABNORMAL HIGH (ref 5–15)
BUN: 99 mg/dL — ABNORMAL HIGH (ref 6–20)
CO2: 20 mmol/L — ABNORMAL LOW (ref 22–32)
Calcium: 8.5 mg/dL — ABNORMAL LOW (ref 8.9–10.3)
Chloride: 98 mmol/L (ref 98–111)
Creatinine, Ser: 12.37 mg/dL — ABNORMAL HIGH (ref 0.61–1.24)
GFR calc Af Amer: 5 mL/min — ABNORMAL LOW (ref >60)
GFR calc non Af Amer: 4 mL/min — ABNORMAL LOW (ref >60)
Glucose, Bld: 120 mg/dL — ABNORMAL HIGH (ref 70–99)
Potassium: 4.2 mmol/L (ref 3.5–5.1)
Sodium: 136 mmol/L (ref 135–145)
Total Bilirubin: 0.7 mg/dL (ref 0.3–1.2)
Total Protein: 6.2 g/dL — ABNORMAL LOW (ref 6.5–8.1)

## 2019-12-29 LAB — PROTIME-INR
INR: 1.4 — ABNORMAL HIGH (ref 0.8–1.2)
Prothrombin Time: 16.6 seconds — ABNORMAL HIGH (ref 11.4–15.2)

## 2019-12-29 LAB — PREPARE RBC (CROSSMATCH)

## 2019-12-29 LAB — FOLATE RBC
Folate, Hemolysate: 347 ng/mL
Folate, RBC: 1599 ng/mL (ref >498)
Hematocrit: 21.7 % — ABNORMAL LOW (ref 37.5–51.0)

## 2019-12-29 LAB — PARATHYROID HORMONE, INTACT (NO CA): PTH: 110 pg/mL — ABNORMAL HIGH (ref 15–65)

## 2019-12-29 MED ORDER — MIDAZOLAM HCL 2 MG/2ML IJ SOLN
INTRAMUSCULAR | Status: AC
  Filled 2019-12-29: qty 2

## 2019-12-29 MED ORDER — DARBEPOETIN ALFA 100 MCG/0.5ML IJ SOSY
PREFILLED_SYRINGE | INTRAMUSCULAR | Status: AC
  Filled 2019-12-29: qty 0.5

## 2019-12-29 MED ORDER — FENTANYL CITRATE (PF) 100 MCG/2ML IJ SOLN
INTRAMUSCULAR | Status: AC
  Filled 2019-12-29: qty 2

## 2019-12-29 MED ORDER — CEFAZOLIN (ANCEF) 1 G IV SOLR
INTRAVENOUS | Status: AC | PRN
  Administered 2019-12-29: 2 g

## 2019-12-29 MED ORDER — FENTANYL CITRATE (PF) 100 MCG/2ML IJ SOLN
INTRAMUSCULAR | Status: AC | PRN
  Administered 2019-12-29: 50 ug via INTRAVENOUS

## 2019-12-29 MED ORDER — HEPARIN SODIUM (PORCINE) 1000 UNIT/ML IJ SOLN
INTRAMUSCULAR | Status: AC | PRN
  Administered 2019-12-29: 1000 [IU] via INTRAVENOUS

## 2019-12-29 MED ORDER — MIDAZOLAM HCL 2 MG/2ML IJ SOLN
INTRAMUSCULAR | Status: AC | PRN
  Administered 2019-12-29: 1 mg via INTRAVENOUS

## 2019-12-29 MED ORDER — CEFAZOLIN SODIUM-DEXTROSE 2-4 GM/100ML-% IV SOLN
INTRAVENOUS | Status: AC
  Filled 2019-12-29: qty 100

## 2019-12-29 MED ORDER — SODIUM CHLORIDE 0.9% IV SOLUTION: Freq: Once | INTRAVENOUS | Status: DC

## 2019-12-29 MED ORDER — VANCOMYCIN HCL IN DEXTROSE 1-5 GM/200ML-% IV SOLN
INTRAVENOUS | Status: AC
  Administered 2019-12-29: 1000 mg via INTRAVENOUS
  Filled 2019-12-29: qty 200

## 2019-12-29 MED ORDER — LIDOCAINE HCL 1 % IJ SOLN
INTRAMUSCULAR | Status: AC
  Filled 2019-12-29: qty 20

## 2019-12-29 MED ORDER — VANCOMYCIN HCL IN DEXTROSE 1-5 GM/200ML-% IV SOLN
1000.0000 mg | INTRAVENOUS | Status: DC
  Filled 2019-12-29: qty 200

## 2019-12-29 MED ORDER — SODIUM CHLORIDE 0.9 % IV SOLN: 250.0000 mg | Freq: Once | INTRAVENOUS | Status: DC

## 2019-12-29 MED ORDER — LIDOCAINE HCL 1 % IJ SOLN
INTRAMUSCULAR | Status: AC | PRN
  Administered 2019-12-29: 10 mL

## 2019-12-29 MED ORDER — GELATIN ABSORBABLE 12-7 MM EX MISC
CUTANEOUS | Status: AC
  Filled 2019-12-29: qty 1

## 2019-12-29 MED ORDER — HEPARIN SODIUM (PORCINE) 1000 UNIT/ML IJ SOLN
INTRAMUSCULAR | Status: AC
  Filled 2019-12-29: qty 1

## 2019-12-29 NOTE — Progress Notes (Signed)
Patient ID: Wesley [name], male   DOB: 1961/02/05, 59 y.o.   MRN: [id number] The patient has a tunneled catheter for hemodialysis.  Need records from [facility] from fistulogram 1 week ago.  Do not see any indication to repeat this.  The question is is there any merit to attempting revision of his forearm fistula versus conversion to upper arm fistula.  Need results from his outpatient study from 1 week ago to make this determination.  This could be done as an outpatient if he medically ready for discharge.

## 2019-12-29 NOTE — Progress Notes (Signed)
PT Cancellation Note  Patient Details Name: Wesley [name] MRN: [id number] DOB: 1961-01-21   Cancelled Treatment:    Reason Eval/Treat Not Completed: Medical issues which prohibited therapy - hemoglobin 6.6 and RN reports plan to get unit this afternoon in dialysis. PT will continue to f/u with pt acutely as available and appropriate.    [place] 12/29/2019, 1:27 PM

## 2019-12-29 NOTE — Progress Notes (Signed)
OT Cancellation Note  Patient Details Name: Wesley [name] MRN: [id number] DOB: Jun 23, 1961   Cancelled Treatment:    Reason Eval/Treat Not Completed: Medical issues which prohibited therapy- hemoglobin 6.6 and RN reports plan to get unit this afternoon in dialysis.  Will follow and see as able.   Jolaine Artist, OT Acute Rehabilitation Services Pager [phone] Office [phone]   [name] 12/29/2019, 11:35 AM

## 2019-12-29 NOTE — Progress Notes (Addendum)
[facility] ROUNDING NOTE   Subjective:   This is a 59 year old gentleman end-stage renal disease Monday Wednesday Friday with [facility] atrial fibrillation coronary arteries diabetes hypertension presents the emergency room for nonfunctioning fistula.  Attempts to run him to dialysis through his left arm radiocephalic fistula was unsuccessful.  He does have some area of fullness and erythema mid forearm but there is no prosthetic material I agree that this is unlike to be a fistula infection with continue IV antibiotics and blood cultures.  Appreciate assistance from vein and vascular surgery Dr. Sherren Mocha Early   Placement of right IJ catheter.  Appreciate assistance from interventional radiology 12/29/2019.  Blood pressure 115/86 pulse 87 temperature 97.8 O2 sats 98%  Sodium 136 potassium 4.2 chloride 98 CO2 20 BUN 99 creatinine 12.36 glucose 120 calcium 8.5 albumin 2.5 hemoglobin 6.6  PhosLo 3 tablets 3 times daily with meals, insulin sliding scale, vancomycin per pharmacy, Maxipime every 24 hours darbepoetin 100 mcg 12/28/2019   Objective:  Vital signs in last 24 hours:  Temp:  [97.8 F (36.6 C)-98.9 F (37.2 C)] 97.8 F (36.6 C) (04/02 0618) Pulse Rate:  [81-106] 81 (04/02 1050) Resp:  [16-25] 21 (04/02 1045) BP: (103-139)/(64-96) 115/86 (04/02 1050) SpO2:  [96 %-100 %] 98 % (04/02 1050) Weight:  [109.7 kg] 109.7 kg (04/02 0500)  Weight change: 4.973 kg Filed Weights   12/28/19 0719 12/28/19 0837 12/29/19 0500  Weight: 109.3 kg 108.2 kg 109.7 kg    Intake/Output: I/O last 3 completed shifts: In: 6546 [P.O.:720; IV Piggyback:700] Out: 657 [Other:657]   Intake/Output this shift:  No intake/output data recorded. Pleasant gentleman nondistressed CVS- RRR no murmurs rubs or gallops RS- CTA no wheeze or rales ABD- BS present soft non-distended EXT-redness and swelling in forearm left tender healed eschar over fistula site   Basic Metabolic Panel: Recent  Labs  Lab 12/27/19 1435 12/27/19 1435 12/28/19 0231 12/28/19 1627 12/29/19 0309  NA 138  --  138 137 136  K 4.9  --  5.3* 4.5 4.2  CL 101  --  100 100 98  CO2 20*  --  21* 24 20*  GLUCOSE 138*  --  138* 131* 120*  BUN 84*  --  96* 90* 99*  CREATININE 10.88*  --  11.36* 11.48* 12.37*  CALCIUM 8.9   < > 8.4* 8.6* 8.5*   < > = values in this interval not displayed.    Liver Function Tests: Recent Labs  Lab 12/27/19 1435 12/29/19 0309  AST 23 65*  ALT 14 27  ALKPHOS 72 58  BILITOT 1.0 0.7  PROT 7.2 6.2*  ALBUMIN 3.1* 2.5*   Recent Labs  Lab 12/27/19 1435  LIPASE 21   No results for input(s): AMMONIA in the last 168 hours.  CBC: Recent Labs  Lab 12/27/19 1435 12/28/19 0231 12/29/19 0309  WBC 19.1* 12.0* 8.0  NEUTROABS 17.2*  --   --   HGB 7.7* 7.3* 6.6*  HCT 24.1* 23.2* 20.9*  MCV 107.1* 107.4* 107.7*  PLT 150 131* 124*    Cardiac Enzymes: No results for input(s): CKTOTAL, CKMB, CKMBINDEX, TROPONINI in the last 168 hours.  BNP: Invalid input(s): POCBNP  CBG: Recent Labs  Lab 12/28/19 0624 12/28/19 1001 12/28/19 1621 12/28/19 2236 12/29/19 0749  GLUCAP 108* 93 131* 135* 100*    Microbiology: Results for orders placed or performed during the hospital encounter of 12/27/19  Culture, blood (routine x 2)     Status: None (Preliminary result)  Collection Time: 12/27/19  3:27 PM   Specimen: BLOOD  Result Value Ref Range Status   Specimen Description BLOOD BLOOD RIGHT WRIST  Final   Special Requests   Final    BOTTLES DRAWN AEROBIC AND ANAEROBIC Blood Culture results may not be optimal due to an excessive volume of blood received in culture bottles   Culture   Final    NO GROWTH < 24 HOURS Performed at [facility], [address], [place], [place] [address]    Report Status PENDING  Incomplete  Culture, blood (routine x 2)     Status: None (Preliminary result)   Collection Time: 12/27/19  3:48 PM   Specimen: BLOOD  Result Value Ref Range  Status   Specimen Description BLOOD RIGHT ANTECUBITAL  Final   Special Requests   Final    BOTTLES DRAWN AEROBIC AND ANAEROBIC Blood Culture adequate volume   Culture   Final    NO GROWTH < 24 HOURS Performed at [facility], [place] [address] [place], [place] [address]    Report Status PENDING  Incomplete  Respiratory Panel by RT PCR (Flu A&B, Covid) - Nasopharyngeal Swab     Status: None   Collection Time: 12/27/19  4:25 PM   Specimen: Nasopharyngeal Swab  Result Value Ref Range Status   SARS Coronavirus 2 by RT PCR NEGATIVE NEGATIVE Final    Comment: (NOTE) SARS-CoV-2 target nucleic acids are NOT DETECTED. The SARS-CoV-2 RNA is generally detectable in upper respiratoy specimens during the acute phase of infection. The lowest concentration of SARS-CoV-2 viral copies this assay can detect is 131 copies/mL. A negative result does not preclude SARS-Cov-2 infection and should not be used as the sole basis for treatment or other patient management decisions. A negative result may occur with  improper specimen collection/handling, submission of specimen other than nasopharyngeal swab, presence of viral mutation(s) within the areas targeted by this assay, and inadequate number of viral copies (<131 copies/mL). A negative result must be combined with clinical observations, patient history, and epidemiological information. The expected result is Negative. Fact Sheet for Patients:  PinkCheek.be Fact Sheet for Healthcare Providers:  GravelBags.it This test is not yet ap proved or cleared by the [place] FDA and  has been authorized for detection and/or diagnosis of SARS-CoV-2 by FDA under an Emergency Use Authorization (EUA). This EUA will remain  in effect (meaning this test can be used) for the duration of the COVID-19 declaration under Section 564(b)(1) of the Act, 21 U.S.C. section 360bbb-3(b)(1), unless the  authorization is terminated or revoked sooner.    Influenza A by PCR NEGATIVE NEGATIVE Final   Influenza B by PCR NEGATIVE NEGATIVE Final    Comment: (NOTE) The Xpert Xpress SARS-CoV-2/FLU/RSV assay is intended as an aid in  the diagnosis of influenza from Nasopharyngeal swab specimens and  should not be used as a sole basis for treatment. Nasal washings and  aspirates are unacceptable for Xpert Xpress SARS-CoV-2/FLU/RSV  testing. Fact Sheet for Patients: PinkCheek.be Fact Sheet for Healthcare Providers: GravelBags.it This test is not yet approved or cleared by the [place] FDA and  has been authorized for detection and/or diagnosis of SARS-CoV-2 by  FDA under an Emergency Use Authorization (EUA). This EUA will remain  in effect (meaning this test can be used) for the duration of the  Covid-19 declaration under Section 564(b)(1) of the Act, 21  U.S.C. section 360bbb-3(b)(1), unless the authorization is  terminated or revoked. Performed at [facility], [place] [address], [place], [place] [address]     Coagulation Studies: Recent Labs    12/29/19 0309  LABPROT 16.6*  INR 1.4*    Urinalysis: No results for input(s): COLORURINE, LABSPEC, PHURINE, GLUCOSEU, HGBUR, BILIRUBINUR, KETONESUR, PROTEINUR, UROBILINOGEN, NITRITE, LEUKOCYTESUR in the last 72 hours.  Invalid input(s): APPERANCEUR    Imaging: IR Fluoro Guide CV Line Left  Result Date: 12/29/2019 INDICATION: END-STAGE RENAL DISEASE, NO CURRENT ACCESS FOR DIALYSIS EXAM: ULTRASOUND GUIDANCE FOR VASCULAR ACCESS RIGHT INTERNAL JUGULAR PERMANENT HEMODIALYSIS CATHETER Date:  12/29/2019 12/29/2019 10:51 am Radiologist:  M. Daryll Brod, MD Guidance:  Ultrasound and fluoroscopic FLUOROSCOPY TIME:  Fluoroscopy Time: 0 minutes 48 seconds (8 mGy). MEDICATIONS: Ancef 2 g within 1 hour of the procedure ANESTHESIA/SEDATION: Versed 1.0 mg IV; Fentanyl 50 mcg IV; Moderate  Sedation Time:  11 minutes The patient was continuously monitored during the procedure by the interventional radiology nurse under my direct supervision. CONTRAST:  None. COMPLICATIONS: None immediate. PROCEDURE: Informed consent was obtained from the patient following explanation of the procedure, risks, benefits and alternatives. The patient understands, agrees and consents for the procedure. All questions were addressed. A time out was performed. Maximal barrier sterile technique utilized including caps, mask, sterile gowns, sterile gloves, large sterile drape, hand hygiene, and 2% chlorhexidine scrub. Under sterile conditions and local anesthesia, right internal jugular micropuncture venous access was performed with ultrasound. Images were obtained for documentation of the patent right internal jugular vein. A guide wire was inserted followed by a transitional dilator. Next, a 0.035 guidewire was advanced into the IVC with a 5-French catheter. Measurements were obtained from the right venotomy site to the proximal right atrium. In the right infraclavicular chest, a subcutaneous tunnel was created under sterile conditions and local anesthesia. 1% lidocaine with epinephrine was utilized for this. The 23 cm tip to cuff palindrome catheter was tunneled subcutaneously to the venotomy site and inserted into the SVC/RA junction through a valved peel-away sheath. Position was confirmed with fluoroscopy. Images were obtained for documentation. Blood was aspirated from the catheter followed by saline and heparin flushes. The appropriate volume and strength of heparin was instilled in each lumen. Caps were applied. The catheter was secured at the tunnel site with Gelfoam and a pursestring suture. The venotomy site was closed with subcuticular Vicryl suture. Dermabond was applied to the small right neck incision. A dry sterile dressing was applied. The catheter is ready for use. No immediate complications. IMPRESSION:  Ultrasound and fluoroscopically guided right internal jugular tunneled hemodialysis catheter (23 cm tip to cuff palindrome catheter). Electronically Signed   By: Jerilynn Mages.  Shick M.D.   On: 12/29/2019 10:56   IR US Guide Vasc Access Right  Result Date: 12/29/2019 INDICATION: END-STAGE RENAL DISEASE, NO CURRENT ACCESS FOR DIALYSIS EXAM: ULTRASOUND GUIDANCE FOR VASCULAR ACCESS RIGHT INTERNAL JUGULAR PERMANENT HEMODIALYSIS CATHETER Date:  12/29/2019 12/29/2019 10:51 am Radiologist:  M. Daryll Brod, MD Guidance:  Ultrasound and fluoroscopic FLUOROSCOPY TIME:  Fluoroscopy Time: 0 minutes 48 seconds (8 mGy). MEDICATIONS: Ancef 2 g within 1 hour of the procedure ANESTHESIA/SEDATION: Versed 1.0 mg IV; Fentanyl 50 mcg IV; Moderate Sedation Time:  11 minutes The patient was continuously monitored during the procedure by the interventional radiology nurse under my direct supervision. CONTRAST:  None. COMPLICATIONS: None immediate. PROCEDURE: Informed consent was obtained from the patient following explanation of the procedure, risks, benefits and alternatives. The patient understands, agrees and consents for the procedure. All questions were addressed. A time out was performed.  Maximal barrier sterile technique utilized including caps, mask, sterile gowns, sterile gloves, large sterile drape, hand hygiene, and 2% chlorhexidine scrub. Under sterile conditions and local anesthesia, right internal jugular micropuncture venous access was performed with ultrasound. Images were obtained for documentation of the patent right internal jugular vein. A guide wire was inserted followed by a transitional dilator. Next, a 0.035 guidewire was advanced into the IVC with a 5-French catheter. Measurements were obtained from the right venotomy site to the proximal right atrium. In the right infraclavicular chest, a subcutaneous tunnel was created under sterile conditions and local anesthesia. 1% lidocaine with epinephrine was utilized for this. The 23  cm tip to cuff palindrome catheter was tunneled subcutaneously to the venotomy site and inserted into the SVC/RA junction through a valved peel-away sheath. Position was confirmed with fluoroscopy. Images were obtained for documentation. Blood was aspirated from the catheter followed by saline and heparin flushes. The appropriate volume and strength of heparin was instilled in each lumen. Caps were applied. The catheter was secured at the tunnel site with Gelfoam and a pursestring suture. The venotomy site was closed with subcuticular Vicryl suture. Dermabond was applied to the small right neck incision. A dry sterile dressing was applied. The catheter is ready for use. No immediate complications. IMPRESSION: Ultrasound and fluoroscopically guided right internal jugular tunneled hemodialysis catheter (23 cm tip to cuff palindrome catheter). Electronically Signed   By: Jerilynn Mages.  Shick M.D.   On: 12/29/2019 10:56   DG Chest Port 1 View  Result Date: 12/27/2019 CLINICAL DATA:  Dialysis fistula malfunction, short of breath EXAM: PORTABLE CHEST 1 VIEW COMPARISON:  05/12/2018 FINDINGS: Single frontal view of the chest demonstrates an enlarged cardiac silhouette, stable. There is central vascular congestion and mild diffuse interstitial prominence, with trace bilateral pleural effusions. No pneumothorax. IMPRESSION: 1. Findings consistent with mild fluid overload. Electronically Signed   By: Randa Ngo M.D.   On: 12/27/2019 14:55     Medications:   . ceFAZolin    . ceFEPime (MAXIPIME) IV Stopped (12/28/19 1806)   . sodium chloride   Intravenous Once  . calcium acetate  2,668 mg Oral TID WC   And  . calcium acetate  1,334 mg Oral With snacks  . Chlorhexidine Gluconate Cloth  6 each Topical Q0600  . Chlorhexidine Gluconate Cloth  6 each Topical Q0600  . darbepoetin (ARANESP) injection - DIALYSIS  100 mcg Intravenous Q Fri-HD  . fentaNYL      . gelatin adsorbable      . heparin      . insulin aspart  0-15  Units Subcutaneous TID WC  . lidocaine      . midazolam      . vancomycin variable dose per unstable renal function (pharmacist dosing)   Does not apply See admin instructions   acetaminophen **OR** acetaminophen, promethazine, senna-docusate  Assessment/ Plan:   ESRD-Monday Wednesday Friday dialysis patient [facility] no emergent indications for dialysis.  Will attempt dialysis for 10/2019 will need placement of dialysis catheter.  I think a tunneled catheter will be fine his white count is only 12,000.  It does appear that he has an area of cellulitis affecting his left arm. Clots of blood withdrawn from catheter on attempts of cannulation.  Patient will probably need fistulogram.  Will defer this to vein and vascular surgery.  Appreciate assistance from Dr. Donnetta Hutching.  We will plan dialysis for 12/29/2019  ANEMIA-we will check iron studies and administer ESA.  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  Blood pressure appears to be controlled  ACCESS-nonfunctioning access with area of superficial cellulitis.  Will probably need fistulogram.  Appreciate assistance from vein and vascular surgery and interventional radiology  LOS: 2 Sherril Croon @TODAY @11 :07 AM

## 2019-12-29 NOTE — Progress Notes (Signed)
CRITICAL VALUE ALERT  Critical Value:  Hemoglobin: 6.6  Date & Time Notied:  12/29/19 0345 AM  Provider Notified: Lorita Officer, MD  Orders Received/Actions taken: Awaiting

## 2019-12-29 NOTE — Progress Notes (Signed)
Subjective: HD#2 Overnight, no acute concerns reported.  This morning, patient assessed at bedside. Pt states he feels okay this AM but feels a little light headed because of his low Hgb. We discussed how we ordered a unit of blood and he is in agreement. Otherwise, pt has no other complaints. He is concerned about getting his catheter placed because it may limit his active lifestyle.   Objective:  Vital signs in last 24 hours: Vitals:   12/28/19 1513 12/28/19 2029 12/29/19 0500 12/29/19 0618  BP: 106/74 122/64  103/67  Pulse: (!) 106 (!) 103  95  Resp: 16 20  20   Temp: 98.7 F (37.1 C) 98.9 F (37.2 C)  97.8 F (36.6 C)  TempSrc: Oral Oral  Oral  SpO2: 98% 99%  96%  Weight:   109.7 kg   Height:       CBC Latest Ref Rng & Units 12/29/2019 12/28/2019 12/27/2019  WBC 4.0 - 10.5 K/uL 8.0 12.0(H) 19.1(H)  Hemoglobin 13.0 - 17.0 g/dL 6.6(LL) 7.3(L) 7.7(L)  Hematocrit 39.0 - 52.0 % 20.9(L) 23.2(L) 24.1(L)  Platelets 150 - 400 K/uL 124(L) 131(L) 150   BMP Latest Ref Rng & Units 12/29/2019 12/28/2019 12/28/2019  Glucose 70 - 99 mg/dL 120(H) 131(H) 138(H)  BUN 6 - 20 mg/dL 99(H) 90(H) 96(H)  Creatinine 0.61 - 1.24 mg/dL 12.37(H) 11.48(H) 11.36(H)  Sodium 135 - 145 mmol/L 136 137 138  Potassium 3.5 - 5.1 mmol/L 4.2 4.5 5.3(H)  Chloride 98 - 111 mmol/L 98 100 100  CO2 22 - 32 mmol/L 20(L) 24 21(L)  Calcium 8.9 - 10.3 mg/dL 8.5(L) 8.6(L) 8.4(L)   Physical Exam  Constitutional: He is oriented to person, place, and time and well-developed, well-nourished, and in no distress. No distress.  HENT:  Head: Normocephalic and atraumatic.  Cardiovascular: Normal rate, S1 normal, S2 normal and intact distal pulses. An irregularly irregular rhythm present.  No murmur heard. Pulmonary/Chest: Effort normal and breath sounds normal. No respiratory distress. He has no wheezes. He has no rales.  Abdominal: Soft. Bowel sounds are normal. He exhibits no distension. There is no abdominal tenderness.    Musculoskeletal:        General: No tenderness, deformity or edema. Normal range of motion.  Neurological: He is alert and oriented to person, place, and time.  Skin: Skin is warm and dry. He is not diaphoretic. There is erythema.  L arm fistula with palpable thrill; surrounding erythema and warmth without tenderness to palpation - improving   Assessment/Plan: [name] is a 59 year old community dwelling male with PMHx of A.fib, HFrEF (EF 20-25% in 2019), ESRD on HD, HTN and CAD admitted for AV fistula thrombosis.   AV Fistula thrombosis:  Patient with AV fistula presenting with inability to draw blood from AV fistula during dialysis session. Patient had short dialysis session yesterday after which fistula stopped working.L arm fistula has palpable thrill; surrounding erythema and warmth improved from yesterday. Blood cultures negative thus far. Patient is afebrile and leukocytosis resolved with antibiotics.  - Vascular surgery consulted, appreciate their recommendations - F/u blood cultures - IV cefepime 1g q24h day 2 - IV vancomycin per pharmacy dosing day 2   ESRD on HD:  Patient with ESRD on MWF schedule but unable to be dialyzed on Wednesday. Dry weight 102-103kg. Currently 109kg. Patient only received short session of HD yesterday. IR guided tunneled HD catheter placement this AM after which patient to receive HD.  - Nephrology consulted, appreciate their  recommendations - IR guided HD catheter placement 4/2 - HD per nephrology  - Monitor and replete electrolytes prn - PhosLo 3 tablets tid with meals  Acute on chronic anemia:  Macrocytic anemia:  Thrombocytopenia:  Patient noted to have Hb 6.6 this AM. Iron studies with low iron and TIBC consistent with anemia of chronic disease. He notes feeling light headed. No obvious source of bleeding at this time. Appears that his Hb has been down trending since admission.  MCV of 107.7; vitamin B12 wnl.  - Transfuse 1u  pRBC - Post transfusion H/H - CBC daily - F/u folate level - TSH   Atrial fibrillation:  Patient with history of A.fib not on rate/rhythm control at baseline or anticoagulation. Noted to be in A.fib with RVR in setting of hypervolemia. Currently rate controlled with metoprolol 25mg  bid. Discussed with patient regarding anticoagulation. He notes that he would like to hold off and discuss with his cardiologist on discharge.  - Metoprolol tartrate 25mg  bid  - Continue cardiac monitoring  - Will hold off on anticoagulation during admission  HFrEF:  CAD:  Echo in 2019 with EF 20-25% with diffuse hypokinesis. Patient does have signs of volume overload; however, suspect this is in setting of missed dialysis sessions vs worsening HFrEF. Patient not on any aspirin or plavix at home for CAD. Will hold off on adding these at the moment.  - Will hold off on Echo at this time   FEN/GI:  Diet: Renal w/1276ml fluid restriction Electrolytes: Monitor and replete prn Fluids: None   DVT Prophylaxis: subQ heparin Code: DNR  Prior to Admission Living Arrangement: Home Anticipated Discharge Location: Home, pending PT/OT evaluation Barriers to Discharge: Ongoing medical evaluation and treatment  Dispo: Anticipated discharge in approximately 1-2 day(s).   Harvie Heck, MD  Internal Medicine, PGY-1 12/29/2019, 6:22 AM Pager: [phone]

## 2019-12-29 NOTE — Procedures (Signed)
Interventional Radiology Procedure Note  Procedure: RT IJ HD CATH  Complications: None  Estimated Blood Loss: MIN  Findings: SVCRA

## 2019-12-30 DIAGNOSIS — Z9889 Other specified postprocedural states: Secondary | ICD-10-CM

## 2019-12-30 DIAGNOSIS — D631 Anemia in chronic kidney disease: Secondary | ICD-10-CM

## 2019-12-30 LAB — CBC
HCT: 24.4 % — ABNORMAL LOW (ref 39.0–52.0)
Hemoglobin: 7.8 g/dL — ABNORMAL LOW (ref 13.0–17.0)
MCH: 33.8 pg (ref 26.0–34.0)
MCHC: 32 g/dL (ref 30.0–36.0)
MCV: 105.6 fL — ABNORMAL HIGH (ref 80.0–100.0)
Platelets: 139 10*3/uL — ABNORMAL LOW (ref 150–400)
RBC: 2.31 MIL/uL — ABNORMAL LOW (ref 4.22–5.81)
RDW: 13.5 % (ref 11.5–15.5)
WBC: 6.4 10*3/uL (ref 4.0–10.5)
nRBC: 0.3 % — ABNORMAL HIGH (ref 0.0–0.2)

## 2019-12-30 LAB — COMPREHENSIVE METABOLIC PANEL
ALT: 42 U/L (ref 0–44)
AST: 85 U/L — ABNORMAL HIGH (ref 15–41)
Albumin: 2.5 g/dL — ABNORMAL LOW (ref 3.5–5.0)
Alkaline Phosphatase: 67 U/L (ref 38–126)
Anion gap: 14 (ref 5–15)
BUN: 45 mg/dL — ABNORMAL HIGH (ref 6–20)
CO2: 28 mmol/L (ref 22–32)
Calcium: 8.7 mg/dL — ABNORMAL LOW (ref 8.9–10.3)
Chloride: 97 mmol/L — ABNORMAL LOW (ref 98–111)
Creatinine, Ser: 7.16 mg/dL — ABNORMAL HIGH (ref 0.61–1.24)
GFR calc Af Amer: 9 mL/min — ABNORMAL LOW (ref >60)
GFR calc non Af Amer: 8 mL/min — ABNORMAL LOW (ref >60)
Glucose, Bld: 160 mg/dL — ABNORMAL HIGH (ref 70–99)
Potassium: 3.5 mmol/L (ref 3.5–5.1)
Sodium: 139 mmol/L (ref 135–145)
Total Bilirubin: 0.6 mg/dL (ref 0.3–1.2)
Total Protein: 6.3 g/dL — ABNORMAL LOW (ref 6.5–8.1)

## 2019-12-30 LAB — TYPE AND SCREEN
ABO/RH(D): A POS
Antibody Screen: NEGATIVE
Unit division: 0

## 2019-12-30 LAB — GLUCOSE, CAPILLARY
Glucose-Capillary: 117 mg/dL — ABNORMAL HIGH (ref 70–99)
Glucose-Capillary: 141 mg/dL — ABNORMAL HIGH (ref 70–99)

## 2019-12-30 LAB — BPAM RBC
Blood Product Expiration Date: 202104302359
ISSUE DATE / TIME: 202104021428
Unit Type and Rh: 6200

## 2019-12-30 LAB — TSH: TSH: 6.164 u[IU]/mL — ABNORMAL HIGH (ref 0.350–4.500)

## 2019-12-30 MED ORDER — CHLORHEXIDINE GLUCONATE CLOTH 2 % EX PADS
6.0000 | MEDICATED_PAD | Freq: Every day | CUTANEOUS | Status: DC
  Administered 2019-12-30: 6 via TOPICAL

## 2019-12-30 MED ORDER — DOXYCYCLINE HYCLATE 100 MG PO TABS: 100.0000 mg | ORAL_TABLET | Freq: Two times a day (BID) | ORAL | 0 refills | Status: DC

## 2019-12-30 NOTE — Progress Notes (Signed)
[facility] ROUNDING NOTE   Subjective:   This is a 59 year old gentleman end-stage renal disease Monday Wednesday Friday with [facility] atrial fibrillation coronary arteries diabetes hypertension presents the emergency room for nonfunctioning fistula.  Attempts to run him to dialysis through his left arm radiocephalic fistula was unsuccessful.  He does have some area of fullness and erythema mid forearm but there is no prosthetic material I agree that this is unlike to be a fistula infection with continue IV antibiotics and blood cultures.  Appreciate assistance from vein and vascular surgery Dr. Sherren Mocha Early   Placement of right IJ catheter.  Appreciate assistance from interventional radiology 12/29/2019.  Blood pressure 130/8065 pulse 85 temperature 98.5 O2 sats 98%  Sodium 139 potassium 3.5 chloride 97 CO2 28 BUN 45 creatinine 7.16 glucose 160 Albumin 2.5 calcium 8.7 AST 85 hemoglobin 7.8 platelets 139  PhosLo 3 tablets 3 times daily with meals, insulin sliding scale, vancomycin per pharmacy, Maxipime every 24 hours darbepoetin 100 mcg 12/28/2019   Objective:  Vital signs in last 24 hours:  Temp:  [97.9 F (36.6 C)-98.8 F (37.1 C)] 98.5 F (36.9 C) (04/03 0519) Pulse Rate:  [76-102] 85 (04/03 0519) Resp:  [14-25] 18 (04/03 0519) BP: (91-139)/(41-98) 130/65 (04/03 0519) SpO2:  [95 %-100 %] 98 % (04/03 0519) Weight:  [107 kg-109.7 kg] 107.5 kg (04/03 0519)  Weight change: 0.4 kg Filed Weights   12/29/19 1203 12/29/19 1623 12/30/19 0519  Weight: 109.7 kg 107 kg 107.5 kg    Intake/Output: I/O last 3 completed shifts: In: 1647.5 [P.O.:720; Blood:630; IV Piggyback:297.5] Out: 3000 [Other:3000]   Intake/Output this shift:  No intake/output data recorded. Pleasant gentleman nondistressed CVS- RRR no murmurs rubs or gallops RS- CTA no wheeze or rales ABD- BS present soft non-distended EXT-redness and swelling in forearm left tender healed eschar over fistula  site   Basic Metabolic Panel: Recent Labs  Lab 12/27/19 1435 12/27/19 1435 12/28/19 0231 12/28/19 0231 12/28/19 1627 12/29/19 0309 12/30/19 0416  NA 138  --  138  --  137 136 139  K 4.9  --  5.3*  --  4.5 4.2 3.5  CL 101  --  100  --  100 98 97*  CO2 20*  --  21*  --  24 20* 28  GLUCOSE 138*  --  138*  --  131* 120* 160*  BUN 84*  --  96*  --  90* 99* 45*  CREATININE 10.88*  --  11.36*  --  11.48* 12.37* 7.16*  CALCIUM 8.9   < > 8.4*   < > 8.6* 8.5* 8.7*   < > = values in this interval not displayed.    Liver Function Tests: Recent Labs  Lab 12/27/19 1435 12/29/19 0309 12/30/19 0416  AST 23 65* 85*  ALT 14 27 42  ALKPHOS 72 58 67  BILITOT 1.0 0.7 0.6  PROT 7.2 6.2* 6.3*  ALBUMIN 3.1* 2.5* 2.5*   Recent Labs  Lab 12/27/19 1435  LIPASE 21   No results for input(s): AMMONIA in the last 168 hours.  CBC: Recent Labs  Lab 12/27/19 1435 12/28/19 0231 12/29/19 0309 12/29/19 1826 12/30/19 0416  WBC 19.1* 12.0* 8.0 6.7 6.4  NEUTROABS 17.2*  --   --   --   --   HGB 7.7* 7.3* 6.6* 8.0* 7.8*  HCT 24.1* 23.2*  21.7* 20.9* 24.3* 24.4*  MCV 107.1* 107.4* 107.7* 102.5* 105.6*  PLT 150 131* 124* 128* 139*    Cardiac  Enzymes: No results for input(s): CKTOTAL, CKMB, CKMBINDEX, TROPONINI in the last 168 hours.  BNP: Invalid input(s): POCBNP  CBG: Recent Labs  Lab 12/28/19 2236 12/29/19 0749 12/29/19 1704 12/29/19 2200 12/30/19 0801  GLUCAP 135* 100* 139* 133* 117*    Microbiology: Results for orders placed or performed during the hospital encounter of 12/27/19  Culture, blood (routine x 2)     Status: None (Preliminary result)   Collection Time: 12/27/19  3:27 PM   Specimen: BLOOD  Result Value Ref Range Status   Specimen Description BLOOD BLOOD RIGHT WRIST  Final   Special Requests   Final    BOTTLES DRAWN AEROBIC AND ANAEROBIC Blood Culture results may not be optimal due to an excessive volume of blood received in culture bottles   Culture   Final     NO GROWTH 2 DAYS Performed at [facility], [place] [address] [place], [place] [address]    Report Status PENDING  Incomplete  Culture, blood (routine x 2)     Status: None (Preliminary result)   Collection Time: 12/27/19  3:48 PM   Specimen: BLOOD  Result Value Ref Range Status   Specimen Description BLOOD RIGHT ANTECUBITAL  Final   Special Requests   Final    BOTTLES DRAWN AEROBIC AND ANAEROBIC Blood Culture adequate volume   Culture   Final    NO GROWTH 2 DAYS Performed at [facility], [place] [address] [place],  [address]    Report Status PENDING  Incomplete  Respiratory Panel by RT PCR (Flu A&B, Covid) - Nasopharyngeal Swab     Status: None   Collection Time: 12/27/19  4:25 PM   Specimen: Nasopharyngeal Swab  Result Value Ref Range Status   SARS Coronavirus 2 by RT PCR NEGATIVE NEGATIVE Final    Comment: (NOTE) SARS-CoV-2 target nucleic acids are NOT DETECTED. The SARS-CoV-2 RNA is generally detectable in upper respiratoy specimens during the acute phase of infection. The lowest concentration of SARS-CoV-2 viral copies this assay can detect is 131 copies/mL. A negative result does not preclude SARS-Cov-2 infection and should not be used as the sole basis for treatment or other patient management decisions. A negative result may occur with  improper specimen collection/handling, submission of specimen other than nasopharyngeal swab, presence of viral mutation(s) within the areas targeted by this assay, and inadequate number of viral copies (<131 copies/mL). A negative result must be combined with clinical observations, patient history, and epidemiological information. The expected result is Negative. Fact Sheet for Patients:  PinkCheek.be Fact Sheet for Healthcare Providers:  GravelBags.it This test is not yet ap proved or cleared by the [place] FDA and  has been authorized for detection  and/or diagnosis of SARS-CoV-2 by FDA under an Emergency Use Authorization (EUA). This EUA will remain  in effect (meaning this test can be used) for the duration of the COVID-19 declaration under Section 564(b)(1) of the Act, 21 U.S.C. section 360bbb-3(b)(1), unless the authorization is terminated or revoked sooner.    Influenza A by PCR NEGATIVE NEGATIVE Final   Influenza B by PCR NEGATIVE NEGATIVE Final    Comment: (NOTE) The Xpert Xpress SARS-CoV-2/FLU/RSV assay is intended as an aid in  the diagnosis of influenza from Nasopharyngeal swab specimens and  should not be used as a sole basis for treatment. Nasal washings and  aspirates are unacceptable for Xpert Xpress SARS-CoV-2/FLU/RSV  testing. Fact Sheet for Patients: PinkCheek.be Fact Sheet for Healthcare Providers: GravelBags.it This test is not  yet approved or cleared by the [place] and  has been authorized for detection and/or diagnosis of SARS-CoV-2 by  FDA under an Emergency Use Authorization (EUA). This EUA will remain  in effect (meaning this test can be used) for the duration of the  Covid-19 declaration under Section 564(b)(1) of the Act, 21  U.S.C. section 360bbb-3(b)(1), unless the authorization is  terminated or revoked. Performed at [facility], [place] [address], [place], [place] [address]     Coagulation Studies: Recent Labs    12/29/19 0309  LABPROT 16.6*  INR 1.4*    Urinalysis: No results for input(s): COLORURINE, LABSPEC, PHURINE, GLUCOSEU, HGBUR, BILIRUBINUR, KETONESUR, PROTEINUR, UROBILINOGEN, NITRITE, LEUKOCYTESUR in the last 72 hours.  Invalid input(s): APPERANCEUR    Imaging: IR Fluoro Guide CV Line Left  Result Date: 12/29/2019 INDICATION: END-STAGE RENAL DISEASE, NO CURRENT ACCESS FOR DIALYSIS EXAM: ULTRASOUND GUIDANCE FOR VASCULAR ACCESS RIGHT INTERNAL JUGULAR PERMANENT HEMODIALYSIS CATHETER Date:  12/29/2019 12/29/2019  10:51 am Radiologist:  M. Daryll Brod, MD Guidance:  Ultrasound and fluoroscopic FLUOROSCOPY TIME:  Fluoroscopy Time: 0 minutes 48 seconds (8 mGy). MEDICATIONS: Ancef 2 g within 1 hour of the procedure ANESTHESIA/SEDATION: Versed 1.0 mg IV; Fentanyl 50 mcg IV; Moderate Sedation Time:  11 minutes The patient was continuously monitored during the procedure by the interventional radiology nurse under my direct supervision. CONTRAST:  None. COMPLICATIONS: None immediate. PROCEDURE: Informed consent was obtained from the patient following explanation of the procedure, risks, benefits and alternatives. The patient understands, agrees and consents for the procedure. All questions were addressed. A time out was performed. Maximal barrier sterile technique utilized including caps, mask, sterile gowns, sterile gloves, large sterile drape, hand hygiene, and 2% chlorhexidine scrub. Under sterile conditions and local anesthesia, right internal jugular micropuncture venous access was performed with ultrasound. Images were obtained for documentation of the patent right internal jugular vein. A guide wire was inserted followed by a transitional dilator. Next, a 0.035 guidewire was advanced into the IVC with a 5-French catheter. Measurements were obtained from the right venotomy site to the proximal right atrium. In the right infraclavicular chest, a subcutaneous tunnel was created under sterile conditions and local anesthesia. 1% lidocaine with epinephrine was utilized for this. The 23 cm tip to cuff palindrome catheter was tunneled subcutaneously to the venotomy site and inserted into the SVC/RA junction through a valved peel-away sheath. Position was confirmed with fluoroscopy. Images were obtained for documentation. Blood was aspirated from the catheter followed by saline and heparin flushes. The appropriate volume and strength of heparin was instilled in each lumen. Caps were applied. The catheter was secured at the tunnel  site with Gelfoam and a pursestring suture. The venotomy site was closed with subcuticular Vicryl suture. Dermabond was applied to the small right neck incision. A dry sterile dressing was applied. The catheter is ready for use. No immediate complications. IMPRESSION: Ultrasound and fluoroscopically guided right internal jugular tunneled hemodialysis catheter (23 cm tip to cuff palindrome catheter). Electronically Signed   By: Jerilynn Mages.  Shick M.D.   On: 12/29/2019 10:56   IR US Guide Vasc Access Right  Result Date: 12/29/2019 INDICATION: END-STAGE RENAL DISEASE, NO CURRENT ACCESS FOR DIALYSIS EXAM: ULTRASOUND GUIDANCE FOR VASCULAR ACCESS RIGHT INTERNAL JUGULAR PERMANENT HEMODIALYSIS CATHETER Date:  12/29/2019 12/29/2019 10:51 am Radiologist:  M. Daryll Brod, MD Guidance:  Ultrasound and fluoroscopic FLUOROSCOPY TIME:  Fluoroscopy Time: 0 minutes 48 seconds (8 mGy). MEDICATIONS: Ancef 2 g within 1 hour of the procedure ANESTHESIA/SEDATION:  Versed 1.0 mg IV; Fentanyl 50 mcg IV; Moderate Sedation Time:  11 minutes The patient was continuously monitored during the procedure by the interventional radiology nurse under my direct supervision. CONTRAST:  None. COMPLICATIONS: None immediate. PROCEDURE: Informed consent was obtained from the patient following explanation of the procedure, risks, benefits and alternatives. The patient understands, agrees and consents for the procedure. All questions were addressed. A time out was performed. Maximal barrier sterile technique utilized including caps, mask, sterile gowns, sterile gloves, large sterile drape, hand hygiene, and 2% chlorhexidine scrub. Under sterile conditions and local anesthesia, right internal jugular micropuncture venous access was performed with ultrasound. Images were obtained for documentation of the patent right internal jugular vein. A guide wire was inserted followed by a transitional dilator. Next, a 0.035 guidewire was advanced into the IVC with a 5-French  catheter. Measurements were obtained from the right venotomy site to the proximal right atrium. In the right infraclavicular chest, a subcutaneous tunnel was created under sterile conditions and local anesthesia. 1% lidocaine with epinephrine was utilized for this. The 23 cm tip to cuff palindrome catheter was tunneled subcutaneously to the venotomy site and inserted into the SVC/RA junction through a valved peel-away sheath. Position was confirmed with fluoroscopy. Images were obtained for documentation. Blood was aspirated from the catheter followed by saline and heparin flushes. The appropriate volume and strength of heparin was instilled in each lumen. Caps were applied. The catheter was secured at the tunnel site with Gelfoam and a pursestring suture. The venotomy site was closed with subcuticular Vicryl suture. Dermabond was applied to the small right neck incision. A dry sterile dressing was applied. The catheter is ready for use. No immediate complications. IMPRESSION: Ultrasound and fluoroscopically guided right internal jugular tunneled hemodialysis catheter (23 cm tip to cuff palindrome catheter). Electronically Signed   By: Jerilynn Mages.  Shick M.D.   On: 12/29/2019 10:56     Medications:   . ceFEPime (MAXIPIME) IV 1 g (12/29/19 1702)  . vancomycin 1,000 mg (12/29/19 1518)   . sodium chloride   Intravenous Once  . calcium acetate  2,668 mg Oral TID WC   And  . calcium acetate  1,334 mg Oral With snacks  . Chlorhexidine Gluconate Cloth  6 each Topical Q0600  . Chlorhexidine Gluconate Cloth  6 each Topical Q0600  . darbepoetin (ARANESP) injection - DIALYSIS  100 mcg Intravenous Q Fri-HD  . insulin aspart  0-15 Units Subcutaneous TID WC   acetaminophen **OR** acetaminophen, promethazine, senna-docusate  Assessment/ Plan:   ESRD-Monday Wednesday Friday dialysis patient [facility] no emergent indications for dialysis.  Will attempt dialysis for 10/2019 will need placement of dialysis  catheter.  I think a tunneled catheter will be fine his white count is only 12,000.  It does appear that he has an area of cellulitis affecting his left arm. Clots of blood withdrawn from catheter on attempts of cannulation.  Patient will probably need fistulogram.  Will defer this to vein and vascular surgery.  Appreciate assistance from Dr. Donnetta Hutching.  Patient tolerated dialysis 12/29/2019 with 3 L removed.  His next dialysis treatment will be 01/01/2020  ANEMIA-we will check iron studies and administer ESA.  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  Blood pressure appears to be controlled  ACCESS-nonfunctioning access with area of superficial cellulitis.  Will probably need fistulogram.  Appreciate assistance from vein and vascular surgery and interventional radiology  Cellulitis  patient continues on Maxipime can transition to oral antibiotics with discharge and follow-up with vein and vascular surgery as an outpatient  LOS: 3 Sherril Croon @TODAY @9 :22 AM

## 2019-12-30 NOTE — Progress Notes (Signed)
   Subjective: Patient was seen and evaluated at bedside on morning rounds. No acute events overnight.  He mentions that he is doing great, was able to ambulate in the hallway without any chest pain, shortness of breath, palpitation. He mentions that he is willing to go home.  I spoke to him about we would like to have the plan discussed with vascular surgery.  All of his concerns were addressed.  Objective:  Vital signs in last 24 hours: Vitals:   12/29/19 1623 12/29/19 1708 12/29/19 2205 12/30/19 0519  BP: (!) 103/48 (!) 119/98 128/78 130/65  Pulse: 85 84 94 85  Resp: 19 18 16 18   Temp: 98.8 F (37.1 C) 98.2 F (36.8 C) 98.2 F (36.8 C) 98.5 F (36.9 C)  TempSrc: Oral Oral Oral Oral  SpO2: 98% 100% 97% 98%  Weight: 107 kg   107.5 kg  Height:       Physical Exam  Constitutional: Pleasant gentleman, in no acute distress.  Head: Normocephalic and atraumatic.  Chest: Right IJ catheter without bleeding, erythema, tenderness Cardiovascular: Irregular, nl S1S2, no murmur, no LEE Respiratory: Effort normal and breath sounds normal. No respiratory distress. No wheezes.  GI: Soft. Bowel sounds are normal. No distension. There is no tenderness.  Neurological: Is alert and oriented x 3  Extremities: Stasis changes on lower extremity, no edema, pulses are present Skin: Not diaphoretic. No erythema.  Psychiatric:  Normal mood and affect. Behavior is normal. Judgment and thought content normal.    Assessment/Plan:  Active Problems:   Hemodialysis AV fistula thrombosis ([place]) 59 year old male with past medical history of A. fib, HFrEF, ESRD on HD, HTN, CAD, admitted for AV fistula thrombosis.  Left AV fistula thrombosis: Probable cellulitis Vascular surgery is on board.  Awaiting for record from [facility] about recent fistulogram to decide about revision of current AV graft versus conversion to upper arm fistula.  Appreciate recommendation. Has been on Vanco and cefepime for probable  cellulitis, patient is afebrile, no leukocytosis no erythema or tenderness at site of fistula.  Blood culture negative to date. Patient is willing to be discharged and follow-up with vascular surgery outpatient.  -Right IJ cath placed 4/20 -Continue Vanco and cefepime, will probably continue with p.o. antibiotic if discharge today. -Appreciate vascular surgery recommendation -RN is trying to get recent fistulogram records from [facility].  -Likely discharge today after evaluation by nephrology and vascular or tomorrow to follow-up with vascular surgeon outpatient  ESRD on HD Monday Wednesday Friday. Macrocytic anemia: Thrombocytopenia Got HD yesterday 4/2 through new right IJ tunneled cath. -Nephrology is on board appreciate recommendations -Received RBC transfusion 4/2 -B12 and folate normal -TSH is elevated: Consider levothyroxine at discharge or when f/u with PCP.  Prior to Admission Living Arrangement: Anticipated Discharge Location:  Barriers to Discharge: Dispo: Anticipated discharge in approximately 0-1 days  Dewayne Hatch, MD 12/30/2019, 6:59 AM Pager: [phone]

## 2019-12-30 NOTE — Progress Notes (Signed)
Discharged home today accompanied by wife.Discharged instructions,personal belongings given to patient.Advised to pick up medications to pharmacy of choice. Verbalized understanding of instructions

## 2019-12-30 NOTE — Evaluation (Signed)
Occupational Therapy Evaluation Patient Details Name: Wesley [name] MRN: [id number] DOB: 04/29/1961 Today's Date: 12/30/2019    History of Present Illness Pt is a 59 y/o male with a PMH of atrial fibrillation, HTN, CAD, ESRD, who presents after having difficulty accessing his AV Fistula for dialysis. Admited for AV fistula thrombosis, and probable Lt UE cellulitis, thrombocytopenia, macrocytic amemia.    Clinical Impression   Patient evaluated by Occupational Therapy with no further acute OT needs identified. All education has been completed and the patient has no further questions. Pt appears to be at, or close to baseline.  He is able to perform ADLs mod I.  HR to 152 with activity and DOE 2/4 as he fatigues.  He seems to have very good awareness of medical issues, and ability to self monitor.  See below for any follow-up Occupational Therapy or equipment needs. OT is signing off. Thank you for this referral.      Follow Up Recommendations  No OT follow up    Equipment Recommendations  None recommended by OT    Recommendations for Other Services       Precautions / Restrictions Precautions Precautions: None      Mobility Bed Mobility Overal bed mobility: Independent                Transfers Overall transfer level: Independent                    Balance Overall balance assessment: No apparent balance deficits (not formally assessed)                                         ADL either performed or assessed with clinical judgement   ADL Overall ADL's : Modified independent                                       General ADL Comments: Pt is able to perform ADLs without assist, is able to retrieve items from floor independently with no LOB.  HR to 152 with activity.  Discussed pacing and taking rest breaks with pt      Vision Baseline Vision/History: Wears glasses Patient Visual Report: No change from baseline        Perception     Praxis      Pertinent Vitals/Pain Pain Assessment: No/denies pain     Hand Dominance     Extremity/Trunk Assessment Upper Extremity Assessment Upper Extremity Assessment: Overall WFL for tasks assessed   Lower Extremity Assessment Lower Extremity Assessment: Overall WFL for tasks assessed   Cervical / Trunk Assessment Cervical / Trunk Assessment: Normal   Communication Communication Communication: No difficulties   Cognition Arousal/Alertness: Awake/alert Behavior During Therapy: WFL for tasks assessed/performed Overall Cognitive Status: Within Functional Limits for tasks assessed                                     General Comments  HR to 152 with activity.  DOE 2/4 as he fatigued     Exercises     Shoulder Instructions      Home Living Family/patient expects to be discharged to:: Private residence Living Arrangements: Spouse/significant other Available Help at Discharge: Family Type of Home: House  Home Layout: Multi-level;Laundry or work area in basement;Able to live on main level with Financial controller: Standard                Prior Functioning/Environment Level of Independence: Independent        Comments: Pt enjoys hiking and kayaking         OT Problem List: Decreased activity tolerance      OT Treatment/Interventions:      OT Goals(Current goals can be found in the care plan section) Acute Rehab OT Goals OT Goal Formulation: All assessment and education complete, DC therapy  OT Frequency:     Barriers to D/C:            Co-evaluation PT/OT/SLP Co-Evaluation/Treatment: Yes Reason for Co-Treatment: To address functional/ADL transfers   OT goals addressed during session: ADL's and self-care      AM-PAC OT "6 Clicks" Daily Activity     Outcome Measure Help from another person eating meals?: None Help from another person taking care of personal grooming?:  None Help from another person toileting, which includes using toliet, bedpan, or urinal?: None Help from another person bathing (including washing, rinsing, drying)?: None Help from another person to put on and taking off regular upper body clothing?: None Help from another person to put on and taking off regular lower body clothing?: None 6 Click Score: 24   End of Session Nurse Communication: Mobility status  Activity Tolerance: Patient tolerated treatment well Patient left: Other (comment)(ambulating in room )  OT Visit Diagnosis: Muscle weakness (generalized) (M62.81)                Time: 3845-3646 OT Time Calculation (min): 24 min Charges:  OT General Charges $OT Visit: 1 Visit OT Evaluation $OT Eval Low Complexity: 1 Low  Nilsa Nutting., OTR/L Acute Rehabilitation Services Pager [phone] Office [place], [place] 12/30/2019, 11:59 AM

## 2019-12-30 NOTE — Discharge Summary (Signed)
Name: Wesley [name] MRN: [id number] DOB: 1960-11-28 59 y.o. PCP: Arsenio Katz, NP  Date of Admission: 12/27/2019 12:07 PM Date of Discharge: 12/30/2019 Attending Physician: No att. providers found  Discharge Diagnosis: 1. Active Problems:   Hemodialysis AV fistula thrombosis [facility])   Discharge Medications: Allergies as of 12/30/2019   No Known Allergies     Medication List    TAKE these medications   acetaminophen 500 MG tablet Commonly known as: TYLENOL Take 500 mg by mouth every 6 (six) hours as needed (for pain.).   calcium acetate 667 MG capsule Commonly known as: PHOSLO Take 2-4 capsules by mouth See admin instructions. Take 4 capsules with meals and 2 capsules with snacks.   doxycycline 100 MG tablet Commonly known as: VIBRA-TABS Take 1 tablet (100 mg total) by mouth 2 (two) times daily.   torsemide 20 MG tablet Commonly known as: DEMADEX Take 3 tablets (60 mg total) by mouth daily. What changed:   how much to take  when to take this  reasons to take this       Disposition and follow-up:   Mr.Wesley [name] was discharged from [facility] in [place] condition.  At the hospital follow up visit please address:  Patient presented with left AV fistula thrombosis and superficial cellulitis managed with antibiotic. Right IJ cath placed 4/20 and used for HD.  Please make sure he completes antibiotic course with p.o. doxycycline.  Please evaluate for any signs or symptoms of infection. Please make sure patient follows up with vascular surgery outpatient for reevaluation and probable revision versus conversion to upper arm fistula. Patient has atrial fibrillation, not on rate or rhythm control or anticoagulation. (He is resistant to starting anticoagulation before coming to his cardiologist).  He was in RVR on arrival likely in setting of hypervolemia.  He received metoprolol 25 mg twice daily during this hospitalization. Please make  sure he follows up with cardiologist   2.  Labs / imaging needed at time of follow-up: CBC, TSH, BMP  3.  Pending labs/ test needing follow-up: none  Follow-up Appointments: Follow-up Information    Early, Arvilla Meres, MD. Schedule an appointment as soon as possible for a visit in 2 day(s).   Specialties: Vascular Surgery, Cardiology Why: Please call office to make an appointment. Contact information: [place] [address] [phone]        Arsenio Katz, NP. Schedule an appointment as soon as possible for a visit in 1 week(s).   Specialty: Nurse Practitioner Contact information: [place] [address] [phone]        Herminio Commons, MD .   Specialty: Cardiology Contact information: [place] [address] [phone]           Hospital Course by problem list:  Left AV fistula thrombosis: Probable cellulitis  59 year old male with past medical history of A. fib, HFrEF, ESRD on HD, HTN, CAD, presented with nonfunctional fistula from his dialysis center.  Attempts to use fistula for HD in hospital was unsuccessful and right IJ cath placed to resume hemodialysis.  He had angioplasty by [facility] recently and his fistula worked well on prior HD session, however his fistula was not functional to be accessed outpatient HD unit on the day of admission and he was sent to emergency department.  There was no evidence of fistula infection however there was suspicious for superficial cellulitis around the area and he was a started  on IV Vanco and cefepime.  Blood culture that was obtained prior to starting antibiotic remain negative, patient remained afebrile and stable.  He had a recent fistulogram performed at [facility] prior to admission that was not accessible.  Patient is discharged home to follow-up with vascular surgery outpatient to reassess the need for revision vs conversion to upper arm fistula.   ESRD on HD MWF: Anemia of  CKD No evidence of uremia and arrival.  Right IJ tunneled cath placed 4/20 and patient received HD through that. He received RBC transfusion 4/2 as below ESA agent per nephrology. He will resume HD outpatient.  A. fib with RVR: Patient presented with A. fib with RVR, likely in setting of hypervolemia.  He received metoprolol 25 mg twice daily and his rate was controlled.  He is not on any rate or rate control or any anticoagulation at home.  He prefers to discuss this with his cardiologist before starting new medication.  Discharge Vitals:   BP 130/65 (BP Location: Right Arm)   Pulse 85   Temp 98.5 F (36.9 C) (Oral)   Resp 18   Ht 5\' 10"  (1.778 m)   Wt 107.5 kg   SpO2 98%   BMI 34.02 kg/m   Pertinent Labs, Studies, and Procedures:  BMP Latest Ref Rng & Units 12/30/2019 12/29/2019 12/28/2019  Glucose 70 - 99 mg/dL 160(H) 120(H) 131(H)  BUN 6 - 20 mg/dL 45(H) 99(H) 90(H)  Creatinine 0.61 - 1.24 mg/dL 7.16(H) 12.37(H) 11.48(H)  Sodium 135 - 145 mmol/L 139 136 137  Potassium 3.5 - 5.1 mmol/L 3.5 4.2 4.5  Chloride 98 - 111 mmol/L 97(L) 98 100  CO2 22 - 32 mmol/L 28 20(L) 24  Calcium 8.9 - 10.3 mg/dL 8.7(L) 8.5(L) 8.6(L)   Blood Culture Component     Latest Ref Rng & Units 12/27/2019         3:27 PM  Specimen Description      BLOOD BLOOD RIGHT WRIST  Special Requests      BOTTLES DRAWN AEROBIC AND ANAEROBIC Blood Culture results may not be optimal due to an excessive volume of blood received in culture bottles  Culture      NO GROWTH 5 DAYS .  Report Status      01/01/2020 FINAL    Discharge Instructions: Discharge Instructions    Call MD for:  extreme fatigue   Complete by: As directed    Call MD for:  persistant nausea and vomiting   Complete by: As directed    Call MD for:  redness, tenderness, or signs of infection (pain, swelling, redness, odor or green/yellow discharge around incision site)   Complete by: As directed    Call MD for:  severe uncontrolled pain   Complete  by: As directed    Call MD for:  temperature >100.4   Complete by: As directed    Diet - low sodium heart healthy   Complete by: As directed    Discharge instructions   Complete by: As directed    Thank you for allowing [place] taking care of you at [facility].  We are glad that you are feeling better. Please take antibiotic (doxycycline) 1 tablet every 12 hours.  Take it with plenty of water and do not lay down for 20 minutes after taking doxycycline. Make sure to follow-up with vascular surgery next week.  Follow-up with nephrology and continue hemodialysis sessions.  Follow-up with your cardiologist and your primary care next week.  (Make  sure to call your physicians offices and get appointment).  Keep that catheter area dry and clean. Your thyroid hormone was checked during this hospitalization and was suggestive of low thyroid function.  I start you on thyroid medicine, follow-up with your primary care to repeat the test and start you on thyroid medication if requires.   Increase activity slowly   Complete by: As directed       Signed: Dewayne Hatch, MD 01/05/2020, 8:04 PM   Pager: @[phone] @

## 2019-12-30 NOTE — Evaluation (Signed)
Physical Therapy Evaluation & Discharge Patient Details Name: Wesley [name] MRN: [id number] DOB: 1961-01-31 Today's Date: 12/30/2019   History of Present Illness  Pt is a 59 y/o male with a PMH of atrial fibrillation, HTN, CAD, ESRD, who presents after having difficulty accessing his AV Fistula for dialysis. Admited for AV fistula thrombosis, and probable Lt UE cellulitis, thrombocytopenia, macrocytic amemia.   Clinical Impression  Pt presented supine in bed with HOB elevated, awake and willing to participate in therapy session. Prior to admission, pt reported that he was independent with all functional mobility and ADLs. Pt enjoys hiking and kayaking with his friends on his "off days" from HD. At the time of evaluation, pt independent with all functional mobility including hallway ambulation. Of note, pt's HR fluctuating with activity from high 90's to as high as 155 bpm. Pt reporting that he feels he is at his baseline in regards to his functional mobility. No further acute PT needs identified at this time. PT signing off.     Follow Up Recommendations No PT follow up    Equipment Recommendations  None recommended by PT    Recommendations for Other Services       Precautions / Restrictions Precautions Precautions: Other (comment) Precaution Comments: monitor HR Restrictions Weight Bearing Restrictions: No      Mobility  Bed Mobility Overal bed mobility: Independent                Transfers Overall transfer level: Independent                  Ambulation/Gait Ambulation/Gait assistance: Independent Gait Distance (Feet): 1500 Feet Assistive device: None Gait Pattern/deviations: Step-through pattern Gait velocity: able to fluctuate   General Gait Details: no difficulties, no instability or need for assistance  Stairs            Wheelchair Mobility    Modified Rankin (Stroke Patients Only)       Balance Overall balance assessment: No  apparent balance deficits (not formally assessed)                                           Pertinent Vitals/Pain Pain Assessment: No/denies pain    Home Living Family/patient expects to be discharged to:: Private residence Living Arrangements: Spouse/significant other Available Help at Discharge: Family Type of Home: House       Home Layout: Multi-level;Laundry or work area in basement;Able to live on main level with bedroom/bathroom        Prior Function Level of Independence: Independent         Comments: Pt enjoys hiking and Pharmacologist        Extremity/Trunk Assessment   Upper Extremity Assessment Upper Extremity Assessment: Overall WFL for tasks assessed    Lower Extremity Assessment Lower Extremity Assessment: Overall WFL for tasks assessed    Cervical / Trunk Assessment Cervical / Trunk Assessment: Normal  Communication   Communication: No difficulties  Cognition Arousal/Alertness: Awake/alert Behavior During Therapy: WFL for tasks assessed/performed Overall Cognitive Status: Within Functional Limits for tasks assessed                                        General Comments General comments (skin integrity, edema, etc.): HR  to 152 with activity.  DOE 2/4 as he fatigued     Exercises     Assessment/Plan    PT Assessment Patent does not need any further PT services  PT Problem List         PT Treatment Interventions      PT Goals (Current goals can be found in the Care Plan section)  Acute Rehab PT Goals Patient Stated Goal: home today PT Goal Formulation: All assessment and education complete, DC therapy    Frequency     Barriers to discharge        Co-evaluation PT/OT/SLP Co-Evaluation/Treatment: Yes Reason for Co-Treatment: For patient/therapist safety;To address functional/ADL transfers PT goals addressed during session: Mobility/safety with  mobility;Balance;Strengthening/ROM OT goals addressed during session: ADL's and self-care       AM-PAC PT "6 Clicks" Mobility  Outcome Measure Help needed turning from your back to your side while in a flat bed without using bedrails?: None Help needed moving from lying on your back to sitting on the side of a flat bed without using bedrails?: None Help needed moving to and from a bed to a chair (including a wheelchair)?: None Help needed standing up from a chair using your arms (e.g., wheelchair or bedside chair)?: None Help needed to walk in hospital room?: None Help needed climbing 3-5 steps with a railing? : None 6 Click Score: 24    End of Session   Activity Tolerance: Patient tolerated treatment well Patient left: with call bell/phone within reach;with family/visitor present Nurse Communication: Mobility status PT Visit Diagnosis: Other abnormalities of gait and mobility (R26.89)    Time: 1610-9604 PT Time Calculation (min) (ACUTE ONLY): 24 min   Charges:              Anastasio Champion, DPT  Acute Rehabilitation Services Pager [phone] Office [place] 12/30/2019, 1:21 PM

## 2020-01-01 LAB — CULTURE, BLOOD (ROUTINE X 2)
Culture: NO GROWTH
Culture: NO GROWTH
Special Requests: ADEQUATE

## 2020-01-05 ENCOUNTER — Other Ambulatory Visit: Payer: Self-pay | Admitting: *Deleted

## 2020-01-05 NOTE — Progress Notes (Signed)
Spoke with patient let him know Dr Donnetta Hutching or someone from our office will contact him.

## 2020-01-08 ENCOUNTER — Other Ambulatory Visit: Payer: Self-pay

## 2020-01-29 ENCOUNTER — Other Ambulatory Visit: Payer: Self-pay

## 2020-01-29 ENCOUNTER — Other Ambulatory Visit (HOSPITAL_COMMUNITY)
Admission: RE | Admit: 2020-01-29 | Discharge: 2020-01-29 | Disposition: A | Discharge status: Home / Self Care | Payer: Commercial Managed Care - PPO | Source: Ambulatory Visit | Attending: Vascular Surgery | Admitting: Vascular Surgery

## 2020-01-29 DIAGNOSIS — Z01812 Encounter for preprocedural laboratory examination: Secondary | ICD-10-CM | POA: Diagnosis not present

## 2020-01-29 DIAGNOSIS — Z20822 Contact with and (suspected) exposure to covid-19: Secondary | ICD-10-CM | POA: Insufficient documentation

## 2020-01-29 LAB — SARS CORONAVIRUS 2 (TAT 6-24 HRS): SARS Coronavirus 2: NEGATIVE

## 2020-01-30 ENCOUNTER — Other Ambulatory Visit: Payer: Self-pay

## 2020-01-30 ENCOUNTER — Encounter (HOSPITAL_COMMUNITY): Payer: Self-pay | Admitting: Vascular Surgery

## 2020-01-30 NOTE — Progress Notes (Signed)
Pt denies SOB and chest pain. Pt under the care of Bernerd Pho, [place], Cardiology. Pt denies having a stress test. Pt made aware to stop taking vitamins, fish oiland herbal medications. Do not take any NSAIDs ie: Ibuprofen, Advil, Naproxen (Aleve), Motrin, BC and Goody Powder.Pt made aware to check BG every 2 hours prior to arrival to hospital on DOS. Pt made aware to treat a BG < 70 with 4 ounces of apple or cranberry juice, wait 15 minutes after intervention to recheck BG, if BG remains < 70, call Short Stay unit to speak with a nurse.Pt reminded to quarantine. Pt verbalized understanding of all pre-op instructions. PA, Anesthesiology,  asked to review pt history ( recent admission ).

## 2020-01-31 ENCOUNTER — Encounter (HOSPITAL_COMMUNITY): Payer: Self-pay | Admitting: Vascular Surgery

## 2020-01-31 NOTE — Anesthesia Preprocedure Evaluation (Deleted)
Anesthesia Evaluation    Airway        Dental   Pulmonary           Cardiovascular hypertension,   Cardiac cath 07/05/18:  [name] 1st Diag to 1st Diag lesion is 80% stenosed.  Prox LAD to Mid LAD lesion is 70% stenosed.  1st Mrg lesion is 70% stenosed.  Mid RCA lesion is 50% stenosed.  Mid RCA to Dist RCA lesion is 80% stenosed.  Dist RCA lesion is 100% stenosed. Left to right collaterals.  LV end diastolic pressure is mildly elevated.  There is no aortic valve stenosis.  Hemodynamic findings consistent with moderate pulmonary hypertension.  PA 53/28, mean PA 38 mm Hg; PCWP mean 28 mm Hg; CO 6.2 L/min; CI 2.7. PA sat 58%. - Significant CAD. No anginal sx. No critical lesions noted. Would continue medical therapy for decreased LVEF. Compliance will have to be shown with his meds. He is currently not taking his metoprolol per his preference. After AV fistula creation, could reconsider cath and then committing him to DAPT if PCI needed.  - He is not an ideal candidate for CABG due to his renal dysfunction, low EF.   Echo 06/28/18: - Left ventricle: The cavity size was normal. Wall thickness was  increased in a pattern of moderate LVH. Systolic function was  severely reduced. The estimated ejection fraction was in the  range of 20% to 25%. Diffuse hypokinesis. The study is not  technically sufficient to allow evaluation of LV diastolic  function.  - Aortic valve: Mildly calcified annulus. Mildly thickened  leaflets. Valve area (VTI): 3.22 cm^2. Valve area (Vmax): 3.53  cm^2.  - Mitral valve: Mildly calcified annulus. Normal thickness leaflets.  - Left atrium: The atrium was moderately dilated.  - Right ventricle: The cavity size was mildly dilated. Systolic  function was mildly reduced.  - Right atrium: The atrium was mildly dilated.  - Technically difficult study, echocontrast was used to enhance   visualization.   Neuro/Psych    GI/Hepatic   Endo/Other  diabetes  Renal/GU      Musculoskeletal   Abdominal   Peds  Hematology   Anesthesia Other Findings   Reproductive/Obstetrics                             Anesthesia Physical Anesthesia Plan  ASA:   Anesthesia Plan:    Post-op Pain Management:    Induction:   PONV Risk Score and Plan:   Airway Management Planned:   Additional Equipment:   Intra-op Plan:   Post-operative Plan:   Informed Consent:   Plan Discussed with:   Anesthesia Plan Comments: (PAT note written 01/31/2020 by Myra Gianotti, PA-C. ESRD, CAD (not ideal candidate for CABG, would consider future PCI if symptomatic and/or medically compliant), combined CHF (EF 20-25% 06/2018), afib. Medical therapy limited by hypotension, CKD. He declined statin and anticoagulation. Cardiologist is Dr. Bronson Ing. )        Anesthesia Quick Evaluation

## 2020-01-31 NOTE — Progress Notes (Signed)
Anesthesia Chart Review: Wesley [name]   Case: [id number] Date/Time: 02/01/20 0715   Procedure: LEFT ARTERVENOUS FISTULA REVISION OR NEW LEFT UPPER ARTERIOVENOUS FISTULA (Left )   Anesthesia type: Moderate Sedation   Pre-op diagnosis: END STAGE RENAL DISEASE   Location: [facility] OR ROOM 60 / [facility] OR   Surgeons: [name], MD      DISCUSSION: Patient is a 59 year old male scheduled for the above procedure. He has a right IJ catheter placed 12/29/19 and needs new permanent hemodialysis access.  History includes never smoker, CAD (not felt ideal candidate for CABG 06/2018; could consider future PCI if symptomatic and/or medically compliant), atrial fibrillation, combined chronic systolic and diastolic CHF, DM2, HTN, ESRD ([facility], MWF).  - Admission 12/27/19-12/30/19. He presented with thrombosed LUE AVF and inability to complete dialysis. WBC elevated with low grade fever and erythema around his AVF site and started on antibiotics for cellulitis.  Also known afib but with RVR in the ED, and CXR with mild volume overload that was felt due to missed dialysis due to clotted AVF. Hospitalist treated with metoprolol for afib rate control, and ultimately deferred repeat echo and instead advised on-going out-patient cardiology follow-up. Patient was not on blood thinners and did not want to start, but would readdress at his next cardiology follow-up visit. Vascular surgery consulted for access with plans for out-patient revision versus new AVF/AVGG. IR placed right IJ tunnelled catheter. He also was transfused with 1 unit PRBC for HGB 6.6.    Last cardiology evaluationsit 06/08/19 with Dr. Bronson Ing. In summary, LVEF 20-25% in 06/2018 with cardiac cath showing "80% D1 stenosis, 70% Proximal-LAD, 50% mid-RCA, 80% mid to distal-RCA, 100% distal RCA with left to right collaterals, and moderate pulmonary HTN. Continued medical therapy was recommended at that time given no recent anginal symptoms and in the  setting of medication noncompliance and upcoming fistula placement. Not felt to be an ideal CABG candidate given his renal dysfunction and cardiomyopathy but could consider PCI if he demonstrates medical compliance."He was on hemodialysis at that time, and b-blocker had bee stopped by nephrology due to symptomatic hypotension. He declined statin and anticoagulation therapy. Cardiomyopathy etiology felt to be mixed from CAD and rapid afib. Volume status managed with dialysis. Medication management was limited by hypotension and CKD.   He denied SOB and chest pain per PAT RN phone interview. Afib rate controlled by 12/30/19 discharge. He is a same day work-up, so he will get vitals, labs, and anesthesia team evaluation on the day of surgery. Pre-operative COVID-19 test negative on 01/29/20.   VS:   BP Readings from Last 3 Encounters:  12/30/19 130/65  06/08/19 120/70  01/30/19 138/77   Pulse Readings from Last 3 Encounters:  12/30/19 85  06/08/19 80  01/30/19 79    PROVIDERS: Arsenio Katz, NP is PCP Kate Sable, MD is cardiologist. Last visit 06/08/19 with 6 month follow-up planned.   LABS: For day of surgery. As of 12/30/19, H/H 7.8/24.4, PLT 139, glucose 160, Cr 7.16, AST 85, ALT 42.   IMAGES: 1V CXR 12/27/19: FINDINGS: Single frontal view of the chest demonstrates an enlarged cardiac silhouette, stable. There is central vascular congestion and mild diffuse interstitial prominence, with trace bilateral pleural effusions. No pneumothorax. IMPRESSION: 1. Findings consistent with mild fluid overload.   EKG: EKG 12/28/19 06:54:45: Atrial fibrillation with rapid ventricular response at 115 bpm Septal infarct , age undetermined ST & T wave abnormality, consider inferolateral ischemia Abnormal ECG Since last tracing rate slower  Confirmed by Larae Grooms [phone]) on 12/28/2019 1:08:22 PM   CV: Cardiac cath 07/05/18:  [name] 1st Diag to 1st Diag lesion is 80% stenosed.  Prox  LAD to Mid LAD lesion is 70% stenosed.  1st Mrg lesion is 70% stenosed.  Mid RCA lesion is 50% stenosed.  Mid RCA to Dist RCA lesion is 80% stenosed.  Dist RCA lesion is 100% stenosed. Left to right collaterals.  LV end diastolic pressure is mildly elevated.  There is no aortic valve stenosis.  Hemodynamic findings consistent with moderate pulmonary hypertension.  PA 53/28, mean PA 38 mm Hg; PCWP mean 28 mm Hg; CO 6.2 L/min; CI 2.7. PA sat 58%. - Significant CAD.  No anginal sx.  No critical lesions noted.  Would continue medical therapy for decreased LVEF.  Compliance will have to be shown with his meds.  He is currently not taking his metoprolol per his preference.  After AV fistula creation, could reconsider cath and then committing him to DAPT if PCI needed.   - He is not an ideal candidate for CABG due to his renal dysfunction, low EF.  This could also be considered.  Will discuss with Dr. Bronson Ing. - Recommend Aspirin 81mg  daily for moderate CAD.  Echo 06/28/18: Study Conclusions  - Left ventricle: The cavity size was normal. Wall thickness was  increased in a pattern of moderate LVH. Systolic function was  severely reduced. The estimated ejection fraction was in the  range of 20% to 25%. Diffuse hypokinesis. The study is not  technically sufficient to allow evaluation of LV diastolic  function.  - Aortic valve: Mildly calcified annulus. Mildly thickened  leaflets. Valve area (VTI): 3.22 cm^2. Valve area (Vmax): 3.53  cm^2.  - Mitral valve: Mildly calcified annulus. Normal thickness leaflets.  - Left atrium: The atrium was moderately dilated.  - Right ventricle: The cavity size was mildly dilated. Systolic  function was mildly reduced.  - Right atrium: The atrium was mildly dilated.  - Technically difficult study, echocontrast was used to enhance  visualization.   Past Medical History:  Diagnosis Date  . Anasarca   . Anemia   . Atrial fibrillation  ([place])    diagnosed 05/2018  . Cellulitis   . Coronary artery disease    a. cath in 06/2018 showing 80% D1 stenosis, 70% Proximal-LAD, 50% mid-RCA, 80% mid to distal-RCA, 100% distal RCA with left to right collaterals, and moderate pulmonary HTN. Medical management recommended given upcoming surgery and no recent angina.   . Diabetes mellitus without complication ([place])   . Hypertension   . LV dysfunction    LVEF 20-25% 06/28/18 echo  . Renal disorder     Past Surgical History:  Procedure Laterality Date  . A/V FISTULAGRAM N/A 11/29/2018   Procedure: A/V FISTULAGRAM - left arm;  Surgeon: Serafina Mitchell, MD;  Location: [facility];  Service: Cardiovascular;  Laterality: N/A;  . AV FISTULA PLACEMENT Left 08/02/2018   Procedure: CREATION RADIOCEPHALIC ARTERIOVENOUS FISTULA LEFT ARM;  Surgeon: Elam Dutch, MD;  Location: [facility];  Service: Vascular;  Laterality: Left;  . CATARACT EXTRACTION W/ INTRAOCULAR LENS  IMPLANT, BILATERAL    . FOOT SURGERY     right  . INCISION AND DRAINAGE OF WOUND  04/03/2012   Procedure: IRRIGATION AND DEBRIDEMENT WOUND;  Surgeon: Scherry Ran, MD;  Location: [facility];  Service: General;  Laterality: Right;  . IR FLUORO GUIDE CV LINE LEFT  12/29/2019  . IR FLUORO GUIDE CV  LINE RIGHT  05/13/2018  . IR US GUIDE VASC ACCESS RIGHT  05/13/2018  . IR US GUIDE VASC ACCESS RIGHT  12/29/2019  . LIGATION OF ARTERIOVENOUS  FISTULA Left 12/08/2018   Procedure: BRANCH LIGATION OF LEFT RADIOCEPHALIC ARTERIOVENOUS FISTULA;  Surgeon: Serafina Mitchell, MD;  Location: [facility];  Service: Vascular;  Laterality: Left;  . RIGHT/LEFT HEART CATH AND CORONARY ANGIOGRAPHY N/A 07/05/2018   Procedure: RIGHT/LEFT HEART CATH AND CORONARY ANGIOGRAPHY;  Surgeon: Jettie Booze, MD;  Location: [facility];  Service: Cardiovascular;  Laterality: N/A;    MEDICATIONS: No current facility-administered medications for this encounter.   Marland Kitchen acetaminophen (TYLENOL) 500 MG tablet  .  calcium acetate (PHOSLO) 667 MG capsule  . torsemide (DEMADEX) 20 MG tablet  . doxycycline (VIBRA-TABS) 100 MG tablet     Myra Gianotti, PA-C Surgical Short Stay/Anesthesiology [facility] Phone [phone] [facility] Phone [phone] 01/31/2020 9:57 AM

## 2020-02-01 ENCOUNTER — Ambulatory Visit (HOSPITAL_COMMUNITY)
Admission: RE | Admit: 2020-02-01 | Discharge: 2020-02-01 | Disposition: A | Discharge status: Home / Self Care | Payer: Commercial Managed Care - PPO | Attending: Vascular Surgery | Admitting: Vascular Surgery

## 2020-02-01 ENCOUNTER — Encounter (HOSPITAL_COMMUNITY): Payer: Self-pay | Admitting: Vascular Surgery

## 2020-02-01 ENCOUNTER — Encounter (HOSPITAL_COMMUNITY)
Admission: RE | Disposition: A | Discharge status: Home / Self Care | Payer: Self-pay | Source: Home / Self Care | Attending: Vascular Surgery

## 2020-02-01 ENCOUNTER — Ambulatory Visit: Admit: 2020-02-01 | Payer: Medicare Other | Admitting: Vascular Surgery

## 2020-02-01 ENCOUNTER — Ambulatory Visit (HOSPITAL_COMMUNITY): Payer: Commercial Managed Care - PPO | Admitting: Certified Registered"

## 2020-02-01 ENCOUNTER — Other Ambulatory Visit: Payer: Self-pay

## 2020-02-01 DIAGNOSIS — Z8249 Family history of ischemic heart disease and other diseases of the circulatory system: Secondary | ICD-10-CM | POA: Insufficient documentation

## 2020-02-01 DIAGNOSIS — Z992 Dependence on renal dialysis: Secondary | ICD-10-CM | POA: Insufficient documentation

## 2020-02-01 DIAGNOSIS — Z95828 Presence of other vascular implants and grafts: Secondary | ICD-10-CM | POA: Diagnosis not present

## 2020-02-01 DIAGNOSIS — I272 Pulmonary hypertension, unspecified: Secondary | ICD-10-CM | POA: Insufficient documentation

## 2020-02-01 DIAGNOSIS — N186 End stage renal disease: Secondary | ICD-10-CM | POA: Diagnosis present

## 2020-02-01 DIAGNOSIS — I2510 Atherosclerotic heart disease of native coronary artery without angina pectoris: Secondary | ICD-10-CM | POA: Insufficient documentation

## 2020-02-01 DIAGNOSIS — E1122 Type 2 diabetes mellitus with diabetic chronic kidney disease: Secondary | ICD-10-CM | POA: Insufficient documentation

## 2020-02-01 DIAGNOSIS — I120 Hypertensive chronic kidney disease with stage 5 chronic kidney disease or end stage renal disease: Secondary | ICD-10-CM | POA: Insufficient documentation

## 2020-02-01 DIAGNOSIS — I4891 Unspecified atrial fibrillation: Secondary | ICD-10-CM | POA: Diagnosis not present

## 2020-02-01 DIAGNOSIS — Z833 Family history of diabetes mellitus: Secondary | ICD-10-CM | POA: Insufficient documentation

## 2020-02-01 DIAGNOSIS — N185 Chronic kidney disease, stage 5: Secondary | ICD-10-CM | POA: Diagnosis not present

## 2020-02-01 HISTORY — DX: Chronic combined systolic (congestive) and diastolic (congestive) heart failure: I50.42

## 2020-02-01 HISTORY — PX: AV FISTULA PLACEMENT: SHX1204

## 2020-02-01 LAB — POCT I-STAT, CHEM 8
BUN: 49 mg/dL — ABNORMAL HIGH (ref 6–20)
Calcium, Ion: 1.18 mmol/L (ref 1.15–1.40)
Chloride: 105 mmol/L (ref 98–111)
Creatinine, Ser: 7.6 mg/dL — ABNORMAL HIGH (ref 0.61–1.24)
Glucose, Bld: 112 mg/dL — ABNORMAL HIGH (ref 70–99)
HCT: 32 % — ABNORMAL LOW (ref 39.0–52.0)
Hemoglobin: 10.9 g/dL — ABNORMAL LOW (ref 13.0–17.0)
Potassium: 5.3 mmol/L — ABNORMAL HIGH (ref 3.5–5.1)
Sodium: 140 mmol/L (ref 135–145)
TCO2: 26 mmol/L (ref 22–32)

## 2020-02-01 LAB — GLUCOSE, CAPILLARY
Glucose-Capillary: 114 mg/dL — ABNORMAL HIGH (ref 70–99)
Glucose-Capillary: 103 mg/dL — ABNORMAL HIGH (ref 70–99)

## 2020-02-01 SURGERY — ARTERIOVENOUS (AV) FISTULA CREATION
Anesthesia: Monitor Anesthesia Care | Site: Arm Upper | Laterality: Left

## 2020-02-01 SURGERY — REVISON OF ARTERIOVENOUS FISTULA
Anesthesia: Monitor Anesthesia Care | Laterality: Left

## 2020-02-01 MED ORDER — SODIUM CHLORIDE 0.9 % IV SOLN
INTRAVENOUS | Status: AC
  Filled 2020-02-01: qty 1.2

## 2020-02-01 MED ORDER — SODIUM CHLORIDE 0.9 % IV SOLN
INTRAVENOUS | Status: DC | PRN
  Administered 2020-02-01: 500 mL

## 2020-02-01 MED ORDER — PHENYLEPHRINE 40 MCG/ML (10ML) SYRINGE FOR IV PUSH (FOR BLOOD PRESSURE SUPPORT)
PREFILLED_SYRINGE | INTRAVENOUS | Status: DC | PRN
  Administered 2020-02-01: 40 ug via INTRAVENOUS
  Administered 2020-02-01 (×3): 80 ug via INTRAVENOUS

## 2020-02-01 MED ORDER — ONDANSETRON HCL 4 MG/2ML IJ SOLN
INTRAMUSCULAR | Status: DC | PRN
  Administered 2020-02-01: 4 mg via INTRAVENOUS

## 2020-02-01 MED ORDER — 0.9 % SODIUM CHLORIDE (POUR BTL) OPTIME
TOPICAL | Status: DC | PRN
  Administered 2020-02-01: 1000 mL

## 2020-02-01 MED ORDER — OXYCODONE-ACETAMINOPHEN 7.5-325 MG PO TABS: 1.0000 | ORAL_TABLET | ORAL | 0 refills | Status: DC | PRN

## 2020-02-01 MED ORDER — CEFAZOLIN SODIUM-DEXTROSE 2-4 GM/100ML-% IV SOLN
2.0000 g | Freq: Once | INTRAVENOUS | Status: AC
  Administered 2020-02-01: 2 g via INTRAVENOUS

## 2020-02-01 MED ORDER — PROPOFOL 500 MG/50ML IV EMUL
INTRAVENOUS | Status: DC | PRN
  Administered 2020-02-01: 75 ug/kg/min via INTRAVENOUS

## 2020-02-01 MED ORDER — SODIUM CHLORIDE 0.9 % IV SOLN
INTRAVENOUS | Status: DC | PRN
Start: 2020-02-01 — End: 2020-02-01

## 2020-02-01 MED ORDER — SODIUM CHLORIDE 0.9 % IV SOLN: INTRAVENOUS | Status: DC

## 2020-02-01 MED ORDER — PROPOFOL 10 MG/ML IV BOLUS
INTRAVENOUS | Status: AC
  Filled 2020-02-01: qty 20

## 2020-02-01 MED ORDER — ONDANSETRON HCL 4 MG/2ML IJ SOLN
INTRAMUSCULAR | Status: AC
  Filled 2020-02-01: qty 2

## 2020-02-01 MED ORDER — CHLORHEXIDINE GLUCONATE 4 % EX LIQD: 60.0000 mL | Freq: Once | CUTANEOUS | Status: DC

## 2020-02-01 MED ORDER — PHENYLEPHRINE 40 MCG/ML (10ML) SYRINGE FOR IV PUSH (FOR BLOOD PRESSURE SUPPORT)
PREFILLED_SYRINGE | INTRAVENOUS | Status: AC
  Filled 2020-02-01: qty 10

## 2020-02-01 MED ORDER — CEFAZOLIN SODIUM-DEXTROSE 2-4 GM/100ML-% IV SOLN
INTRAVENOUS | Status: AC
  Filled 2020-02-01: qty 100

## 2020-02-01 MED ORDER — LIDOCAINE-EPINEPHRINE 0.5 %-1:200000 IJ SOLN
INTRAMUSCULAR | Status: AC
  Filled 2020-02-01: qty 1

## 2020-02-01 MED ORDER — MIDAZOLAM HCL 2 MG/2ML IJ SOLN
INTRAMUSCULAR | Status: AC
  Filled 2020-02-01: qty 2

## 2020-02-01 MED ORDER — FENTANYL CITRATE (PF) 250 MCG/5ML IJ SOLN
INTRAMUSCULAR | Status: AC
  Filled 2020-02-01: qty 5

## 2020-02-01 MED ORDER — LIDOCAINE-EPINEPHRINE 0.5 %-1:200000 IJ SOLN
INTRAMUSCULAR | Status: DC | PRN
  Administered 2020-02-01: 12 mL

## 2020-02-01 MED ORDER — FENTANYL CITRATE (PF) 100 MCG/2ML IJ SOLN
INTRAMUSCULAR | Status: DC | PRN
  Administered 2020-02-01: 50 ug via INTRAVENOUS

## 2020-02-01 MED ORDER — MIDAZOLAM HCL 5 MG/5ML IJ SOLN
INTRAMUSCULAR | Status: DC | PRN
  Administered 2020-02-01: 2 mg via INTRAVENOUS

## 2020-02-01 SURGICAL SUPPLY — 35 items
ARMBAND PINK RESTRICT EXTREMIT (MISCELLANEOUS) ×3 IMPLANT
CANISTER SUCT 3000ML PPV (MISCELLANEOUS) ×3 IMPLANT
CANNULA VESSEL 3MM 2 BLNT TIP (CANNULA) ×3 IMPLANT
CLIP LIGATING EXTRA MED SLVR (CLIP) ×3 IMPLANT
CLIP LIGATING EXTRA SM BLUE (MISCELLANEOUS) ×3 IMPLANT
COVER PROBE W GEL 5X96 (DRAPES) ×3 IMPLANT
COVER WAND RF STERILE (DRAPES) IMPLANT
DECANTER SPIKE VIAL GLASS SM (MISCELLANEOUS) ×3 IMPLANT
DERMABOND ADVANCED (GAUZE/BANDAGES/DRESSINGS) ×2
DERMABOND ADVANCED .7 DNX12 (GAUZE/BANDAGES/DRESSINGS) ×1 IMPLANT
ELECT REM PT RETURN 9FT ADLT (ELECTROSURGICAL) ×3
ELECTRODE REM PT RTRN 9FT ADLT (ELECTROSURGICAL) ×1 IMPLANT
GLOVE BIO SURGEON STRL SZ 6.5 (GLOVE) ×2 IMPLANT
GLOVE BIO SURGEONS STRL SZ 6.5 (GLOVE) ×1
GLOVE BIOGEL PI IND STRL 6.5 (GLOVE) ×2 IMPLANT
GLOVE BIOGEL PI IND STRL 8 (GLOVE) ×1 IMPLANT
GLOVE BIOGEL PI INDICATOR 6.5 (GLOVE) ×4
GLOVE BIOGEL PI INDICATOR 8 (GLOVE) ×2
GLOVE SS BIOGEL STRL SZ 6.5 (GLOVE) ×1 IMPLANT
GLOVE SS BIOGEL STRL SZ 7.5 (GLOVE) ×1 IMPLANT
GLOVE SUPERSENSE BIOGEL SZ 6.5 (GLOVE) ×2
GLOVE SUPERSENSE BIOGEL SZ 7.5 (GLOVE) ×2
GOWN STRL REUS W/ TWL LRG LVL3 (GOWN DISPOSABLE) ×3 IMPLANT
GOWN STRL REUS W/TWL LRG LVL3 (GOWN DISPOSABLE) ×6
KIT BASIN OR (CUSTOM PROCEDURE TRAY) ×3 IMPLANT
KIT TURNOVER KIT B (KITS) ×3 IMPLANT
NS IRRIG 1000ML POUR BTL (IV SOLUTION) ×3 IMPLANT
PACK CV ACCESS (CUSTOM PROCEDURE TRAY) ×3 IMPLANT
PAD ARMBOARD 7.5X6 YLW CONV (MISCELLANEOUS) ×6 IMPLANT
SUT PROLENE 6 0 CC (SUTURE) ×3 IMPLANT
SUT VIC AB 3-0 SH 27 (SUTURE) ×2
SUT VIC AB 3-0 SH 27X BRD (SUTURE) ×1 IMPLANT
TOWEL GREEN STERILE (TOWEL DISPOSABLE) ×3 IMPLANT
UNDERPAD 30X30 (UNDERPADS AND DIAPERS) ×3 IMPLANT
WATER STERILE IRR 1000ML POUR (IV SOLUTION) ×3 IMPLANT

## 2020-02-01 NOTE — Anesthesia Procedure Notes (Signed)
Procedure Name: MAC Date/Time: 02/01/2020 7:40 AM Performed by: Moshe Salisbury, CRNA Pre-anesthesia Checklist: Patient identified, Emergency Drugs available, Suction available, Patient being monitored and Timeout performed Patient Re-evaluated:Patient Re-evaluated prior to induction Oxygen Delivery Method: Nasal cannula Placement Confirmation: positive ETCO2 Dental Injury: Teeth and Oropharynx as per pre-operative assessment

## 2020-02-01 NOTE — Anesthesia Postprocedure Evaluation (Signed)
Anesthesia Post Note  Patient: Wesley [name]  Procedure(s) Performed: FIRST STAGE BASILIC ARTERIOVENOUS (AV) FISTULA  LEFT ARM (Left Arm Upper)     Patient location during evaluation: PACU Anesthesia Type: MAC Level of consciousness: awake and alert Pain management: pain level controlled Vital Signs Assessment: post-procedure vital signs reviewed and stable Respiratory status: spontaneous breathing and respiratory function stable Cardiovascular status: stable Postop Assessment: no apparent nausea or vomiting Anesthetic complications: no    Last Vitals:  Vitals:   02/01/20 0915 02/01/20 0919  BP:  109/78  Pulse: 95 90  Resp: 15 18  Temp:    SpO2: 100% 98%    Last Pain:  Vitals:   02/01/20 0919  PainSc: 0-No pain                 Anthony Tamburo DANIEL

## 2020-02-01 NOTE — Transfer of Care (Addendum)
Immediate Anesthesia Transfer of Care Note  Patient: Wesley [name]  Procedure(s) Performed: FIRST STAGE BASILIC ARTERIOVENOUS (AV) FISTULA  LEFT ARM (Left Arm Upper)  Patient Location: PACU  Anesthesia Type:MAC  Level of Consciousness: awake and patient cooperative  Airway & Oxygen Therapy: Patient Spontanous Breathing and Patient connected to nasal cannula oxygen  Post-op Assessment: Report given to RN and Post -op Vital signs reviewed and stable  Post vital signs: Reviewed and stable  Last Vitals:  Vitals Value Taken Time  BP 120/91 02/01/20 0904  Temp    Pulse 53 02/01/20 0904  Resp 18 02/01/20 0904  SpO2 100 % 02/01/20 0904  Vitals shown include unvalidated device data.  Last Pain:  Vitals:   02/01/20 1975  PainSc: 0-No pain         Complications: No apparent anesthesia complications

## 2020-02-01 NOTE — Discharge Instructions (Signed)
° °  [facility] ° °Discharge Instructions ° °AV Fistula or Graft Surgery for Dialysis Access ° °Please refer to the following instructions for your post-procedure care. Your surgeon or physician assistant will discuss any changes with you. ° °Activity ° °You may drive the day following your surgery, if you are comfortable and no longer taking prescription pain medication. Resume full activity as the soreness in your incision resolves. ° °Bathing/Showering ° °You may shower after you go home. Keep your incision dry for 48 hours. Do not soak in a bathtub, hot tub, or swim until the incision heals completely. You may not shower if you have a hemodialysis catheter. ° °Incision Care ° °Clean your incision with mild soap and water after 48 hours. Pat the area dry with a clean towel. You do not need a bandage unless otherwise instructed. Do not apply any ointments or creams to your incision. You may have skin glue on your incision. Do not peel it off. It will come off on its own in about one week. Your arm may swell a bit after surgery. To reduce swelling use pillows to elevate your arm so it is above your heart. Your doctor will tell you if you need to lightly wrap your arm with an ACE bandage. ° °Diet ° °Resume your normal diet. There are not special food restrictions following this procedure. In order to heal from your surgery, it is CRITICAL to get adequate nutrition. Your body requires vitamins, minerals, and protein. Vegetables are the best source of vitamins and minerals. Vegetables also provide the perfect balance of protein. Processed food has little nutritional value, so try to avoid this. ° °Medications ° °Resume taking all of your medications. If your incision is causing pain, you may take over-the counter pain relievers such as acetaminophen (Tylenol). If you were prescribed a stronger pain medication, please be aware these medications can cause nausea and constipation. Prevent  nausea by taking the medication with a snack or meal. Avoid constipation by drinking plenty of fluids and eating foods with high amount of fiber, such as fruits, vegetables, and grains. Do not take Tylenol if you are taking prescription pain medications. ° ° ° ° °Follow up °Your surgeon may want to see you in the office following your access surgery. If so, this will be arranged at the time of your surgery. ° °Please call us immediately for any of the following conditions: ° °Increased pain, redness, drainage (pus) from your incision site °Fever of 101 degrees or higher °Severe or worsening pain at your incision site °Hand pain or numbness. ° °Reduce your risk of vascular disease: ° °Stop smoking. If you would like help, call [facility] at 1-800-QUIT-NOW (1-800-784-8669) or  at 336-586-4000 ° °Manage your cholesterol °Maintain a desired weight °Control your diabetes °Keep your blood pressure down ° °Dialysis ° °It will take several weeks to several months for your new dialysis access to be ready for use. Your surgeon will determine when it is OK to use it. Your nephrologist will continue to direct your dialysis. You can continue to use your Permcath until your new access is ready for use. ° °If you have any questions, please call the office at [phone]. ° °

## 2020-02-01 NOTE — H&P (Signed)
Wesley [name], Arvilla Meres, MD    Wesley [name], Arvilla Meres, MD  Physician  Vascular Surgery     Consult Note      Addendum     Date of Service:  12/27/2019  5:06 PM               Expand AllCollapse All            Expand widget buttonCollapse widget button    Show:Clear all   ManualTemplateCopied  Added by:     [name], MD  Barbie Banner, PA-C   Hover for detailscustomization button                                                                                                                   [facility] Consult           Reason for Consult:  Dialysis access issue  Requesting Physician:  [facility]  MRN #:  [id number]     History of Present Illness: This is a 59 y.o. male male with a history of end-stage renal disease just who presents to the emergency department on the advice of his nephrology service regarding issues with his arteriovenous fistula.  He is status post left radiocephalic AV fistula creation by Dr. Trula Slade in 2019.  He was last seen in our office in March 2020 where he was evaluated with duplex sonography.  His fistula was mature and ready for use.     The patient is seen and examined in emergency department he is awake, alert and in no apparent distress.  He states he dialyzes on Mondays, Wednesdays and Fridays.  The accessing his fistula.  There is issues with clotting and his treatment ran for approximately 1 hour.  He was instructed to follow-up on Monday at the [facility] office and he states he underwent angioplasty and stent placement.  Today at dialysis there was again trouble accessing his fistula along with bleeding.  He was unable to be treated.     Currently the patient denies shortness of breath but does report an episode of chills earlier this  morning.  He states his dry weight is 105 kg.     Past medical history significant for coronary artery disease, diabetes mellitus, hypertension and atrial fibrillation.          Past Medical History:    Diagnosis   Date    .   Anasarca        .   Anemia        .   Atrial fibrillation ([place])            diagnosed 05/2018    .   Cellulitis        .   Coronary artery disease            a. cath in 06/2018 showing 80% D1 stenosis,  70% Proximal-LAD, 50% mid-RCA, 80% mid to distal-RCA, 100% distal RCA with left to right collaterals, and moderate pulmonary HTN. Medical management recommended given upcoming surgery and no recent angina.     .   Diabetes mellitus without complication ([place])        .   Hypertension        .   LV dysfunction            LVEF 20-25% 06/28/18 echo    .   Renal disorder                    Past Surgical History:    Procedure   Laterality   Date    .   A/V FISTULAGRAM   N/A   11/29/2018        Procedure: A/V FISTULAGRAM - left arm;  Surgeon: Serafina Mitchell, MD;  Location: [facility];  Service: Cardiovascular;  Laterality: N/A;    .   AV FISTULA PLACEMENT   Left   08/02/2018        Procedure: CREATION RADIOCEPHALIC ARTERIOVENOUS FISTULA LEFT ARM;  Surgeon: Elam Dutch, MD;  Location: [facility];  Service: Vascular;  Laterality: Left;    .   CATARACT EXTRACTION W/ INTRAOCULAR LENS  IMPLANT, BILATERAL            .   FOOT SURGERY                right    .   INCISION AND DRAINAGE OF WOUND       04/03/2012        Procedure: IRRIGATION AND DEBRIDEMENT WOUND;  Surgeon: Scherry Ran, MD;  Location: [facility];  Service: General;  Laterality: Right;    .   IR FLUORO GUIDE CV LINE RIGHT       05/13/2018    .   IR US GUIDE VASC ACCESS RIGHT       05/13/2018    .   LIGATION OF ARTERIOVENOUS  FISTULA   Left    12/08/2018        Procedure: BRANCH LIGATION OF LEFT RADIOCEPHALIC ARTERIOVENOUS FISTULA;  Surgeon: Serafina Mitchell, MD;  Location: [facility];  Service: Vascular;  Laterality: Left;    .   RIGHT/LEFT HEART CATH AND CORONARY ANGIOGRAPHY   N/A   07/05/2018        Procedure: RIGHT/LEFT HEART CATH AND CORONARY ANGIOGRAPHY;  Surgeon: Jettie Booze, MD;  Location: [facility];  Service: Cardiovascular;  Laterality: N/A;          No Known Allergies             Prior to Admission medications     Medication   Sig   Start Date   End Date   Taking?   Authorizing Provider    acetaminophen (TYLENOL) 500 MG tablet   Take 500 mg by mouth every 6 (six) hours as needed (for pain.).           Yes   [provider]    calcium acetate (PHOSLO) 667 MG capsule   Take 2-4 capsules by mouth See admin instructions. Take 4 capsules with meals and 2 capsules with snacks.   11/02/19       Yes   [provider]    torsemide (DEMADEX) 20 MG tablet   Take 3 tablets (60 mg total) by mouth daily.  Patient taking differently: Take 60-80 mg by  mouth daily as needed (fluid).    05/16/18       Yes   Kathie Dike, MD           Social History             Socioeconomic History    .   Marital status:   Married            Spouse name:   Not on file    .   Number of children:   Not on file    .   Years of education:   Not on file    .   Highest education level:   Not on file    Occupational History    .   Not on file    Tobacco Use    .   Smoking status:   Never Smoker    .   Smokeless tobacco:   Never Used    Substance and Sexual Activity    .   Alcohol use:   Yes            Alcohol/week:   0.0 standard drinks            Comment: rarely    .   Drug use:   No    .   Sexual activity:   Not on file    Other Topics   Concern     .   Not on file    Social History Narrative    .   Not on file        Social Determinants of Health           Financial Resource Strain:     .   Difficulty of Paying Living Expenses:     Food Insecurity:     .   Worried About Charity fundraiser in the Last Year:     .   Arboriculturist in the Last Year:     Transportation Needs:     .   Film/video editor (Medical):     Marland Kitchen   Lack of Transportation (Non-Medical):     Physical Activity:     .   Days of Exercise per Week:     .   Minutes of Exercise per Session:     Stress:     .   Feeling of Stress :     Social Connections:     .   Frequency of Communication with Friends and Family:     .   Frequency of Social Gatherings with Friends and Family:     .   Attends Religious Services:     .   Active Member of Clubs or Organizations:     .   Attends Archivist Meetings:     Marland Kitchen   Marital Status:     Intimate Partner Violence:     .   Fear of Current or Ex-Partner:     .   Emotionally Abused:     Marland Kitchen   Physically Abused:     .   Sexually Abused:                    Family History    Problem   Relation   Age of Onset    .   Cancer - Lung   Father        .   Congestive Heart Failure   Mother        .  Diabetes Mellitus II   Mother        .   Cervical cancer   Mother        .   Dementia   Mother              ROS: Otherwise negative unless mentioned in HPI     Physical Examination          Vitals:        12/27/19 1527   12/27/19 1603    BP:       119/76    Pulse:   (!) 120   (!) 114    Resp:   20   20    Temp:   99.5 F (37.5 C)        SpO2:   97%   95%       Body mass index is 32.54 kg/m.     General:  WDWN in NAD  Gait: Not observed  HENT: WNL, normocephalic  Pulmonary: normal non-labored  breathing, without Rales, rhonchi,  wheezing  Cardiac: irregular, rapid rate  Abdomen:  soft, NT/ND  Skin: without rashes  Extremities: Moves all extremities well.  Examination of the left upper extremity reveals small puncture sites without bleeding.  Sutures in the proximal site.  Mild erythema.  Tenderness to palpation.  He has a good bruit and thrill in his fistula.  Musculoskeletal: no muscle wasting or atrophy        Neurologic: A&O X 3;  No focal weakness or paresthesias are detected; speech is fluent/normal  Psychiatric:  The pt has Normal affect.  Lymph:  Unremarkable     CBC  Labs (Brief)                                                                                                                                                                                                         BMET  Labs (Brief)  COAGS:  Recent Labs                                             ASSESSMENT/PLAN: This is a 59 y.o. male with history of end-stage renal disease on chronic intermittent hemodialysis via left forearm radiocephalic fistula.  He reports recent problems with accessing his fistula and fully treating during dialysis treatments.  Treated recently at [facility] with angioplasty and stent placement.  We will need to obtain definitive information regarding his recent intervention to his fistula.  ED physician notes indicate atrial fibrillation with rapid ventricular rate.     -Dr. Donnetta Hutching will assess and provide further  recommendations.        Barbie Banner PA-C  [facility]  [phone]  12/27/2019   5:06 PM         I have examined the patient, reviewed and agree with above.  Unfortunately I am not able to obtain the treatment notes from [facility] from 5 days ago.  On physical exam, the patient does have a patent fistula with a good thrill.  The skin is intact.  I am not sure why there would be difficulty accessing this.  The patient states there is been a great deal of turnover in the staff.  I explained the only options would be further attempts at revision of this and would make this determination pending review of his recent treatment.  He does have a very nicely dilated left upper arm cephalic vein and could have conversion to upper arm cephalic vein fistula.  Explained that he would require catheter if this was done until the upper arm cephalic vein matured fully.  He wishes to defer and avoid a catheter when all possible.  I agree with admission for treatment of his fever and rapid ventricular response A. fib.  Would recommend accessing his fistula to determine if he has adequate dialysis via this.  If not would consider surgical revision or conversion to upper arm fistula.  He reports that a stent was placed in his fistula at the treatment 5 days ago at [facility].  Will obtain this information to help make appropriate decision     Wesley Jews, MD  Addendum:  The patient has been re-examined and re-evaluated.  The patient's history and physical has been reviewed and is unchanged.    Wesley [name] is a 59 y.o. male is being admitted with END STAGE RENAL DISEASE. All the risks, benefits and other treatment options have been discussed with the patient. The patient has consented to proceed with Procedure(s): ARTERIOVENOUS (AV) FISTULA CREATION as a surgical intervention.  Wesley [name] 02/01/2020 7:22 AM [facility]

## 2020-02-01 NOTE — Op Note (Signed)
    OPERATIVE REPORT  DATE OF SURGERY: 02/01/2020  PATIENT: Wesley [name], 59 y.o. male MRN: [id number]  DOB: 06/30/1961  PRE-OPERATIVE DIAGNOSIS: End-stage renal disease  POST-OPERATIVE DIAGNOSIS:  Same  PROCEDURE: Left first stage basilic brachial fistula  SURGEON:  Curt Jews, M.D.  PHYSICIAN ASSISTANT: Risa Grill, PA-C  ANESTHESIA: Local with sedation  EBL: per anesthesia record  Total I/O In: 350 [I.V.:250; IV Piggyback:100] Out: 15 [Blood:15]  BLOOD ADMINISTERED: none  DRAINS: none  SPECIMEN: none  COUNTS CORRECT:  YES  PATIENT DISPOSITION:  PACU - hemodynamically stable  PROCEDURE DETAILS: Patient was taken operating placed to position with area of the left arm prepped draped you sterile fashion.  SonoSite ultrasound was used to visualize the veins.  Patient had thrombosed antecubital vein and the vein above this in the upper arm was extremely small.  He did have good caliber basilic basilic vein.  Decision was made to proceed with for stage basilic vein fistula.  Incision was made over the medial aspect at the level of the elbow over the basilic vein.  Tributary branches were ligated with 3-0 and 4-0 silk ties and divided.  The vein was ligated distally and was mobilized to the level of the brachial artery.  The brachial artery was exposed through a separate incision at the antecubital space.  The artery had moderate atherosclerotic change but had good caliber.  The artery was occluded proximally distally and was opened with 11 blade sent longstanding with Potts scissors.  The vein was cut to the appropriate length and was spatulated and sewn end-to-side to the artery with a running 6-0 Prolene suture.  Clamps removed and excellent thrill was noted through the vein.  Patient did maintain signal at the radial artery with Doppler and this did not augment with compression of the fistula.  The wounds were irrigated with saline.  Hemostasis electrocautery.  Wound  was closed with 3-0 Vicryl in the subcutaneous septic tissue.  Sterile dressing was applied and the patient was transferred to the recovery in stable condition   [name], M.D., [facility] 02/01/2020 9:09 AM

## 2020-02-01 NOTE — Anesthesia Preprocedure Evaluation (Addendum)
Anesthesia Evaluation  Patient identified by MRN, date of birth, ID band Patient awake    Reviewed: Allergy & Precautions, NPO status , Patient's Chart, lab work & pertinent test results  History of Anesthesia Complications Negative for: history of anesthetic complications  Airway Mallampati: III  TM Distance: >3 FB Neck ROM: Full    Dental  (+) Teeth Intact, Dental Advisory Given   Pulmonary neg pulmonary ROS,    Pulmonary exam normal        Cardiovascular hypertension, + CAD, + Past MI and +CHF  Normal cardiovascular exam     Neuro/Psych negative neurological ROS  negative psych ROS   GI/Hepatic negative GI ROS, Neg liver ROS,   Endo/Other  diabetes  Renal/GU ESRF and DialysisRenal disease     Musculoskeletal   Abdominal   Peds  Hematology  (+) anemia ,   Anesthesia Other Findings   Reproductive/Obstetrics                            Anesthesia Physical Anesthesia Plan  ASA: IV  Anesthesia Plan: MAC   Post-op Pain Management:    Induction: Intravenous  PONV Risk Score and Plan: 1 and Ondansetron and Propofol infusion  Airway Management Planned: Nasal Cannula and Natural Airway  Additional Equipment: None  Intra-op Plan:   Post-operative Plan: Extubation in OR  Informed Consent: I have reviewed the patients History and Physical, chart, labs and discussed the procedure including the risks, benefits and alternatives for the proposed anesthesia with the patient or authorized representative who has indicated his/her understanding and acceptance.     Dental advisory given  Plan Discussed with: CRNA, Anesthesiologist and Surgeon  Anesthesia Plan Comments:        Anesthesia Quick Evaluation

## 2020-03-08 ENCOUNTER — Other Ambulatory Visit: Payer: Self-pay | Admitting: *Deleted

## 2020-03-08 DIAGNOSIS — Z992 Dependence on renal dialysis: Secondary | ICD-10-CM

## 2020-03-08 DIAGNOSIS — N186 End stage renal disease: Secondary | ICD-10-CM

## 2020-03-12 ENCOUNTER — Other Ambulatory Visit: Payer: Self-pay

## 2020-03-12 ENCOUNTER — Ambulatory Visit (INDEPENDENT_AMBULATORY_CARE_PROVIDER_SITE_OTHER): Payer: Self-pay | Admitting: Physician Assistant

## 2020-03-12 ENCOUNTER — Ambulatory Visit (HOSPITAL_COMMUNITY)
Admission: RE | Admit: 2020-03-12 | Discharge: 2020-03-12 | Disposition: A | Discharge status: Home / Self Care | Payer: Commercial Managed Care - PPO | Source: Ambulatory Visit | Attending: Vascular Surgery | Admitting: Vascular Surgery

## 2020-03-12 VITALS — BP 149/78 | HR 110 | Resp 18 | Ht 69.5 in | Wt 245.7 lb

## 2020-03-12 DIAGNOSIS — Z992 Dependence on renal dialysis: Secondary | ICD-10-CM | POA: Diagnosis not present

## 2020-03-12 DIAGNOSIS — N186 End stage renal disease: Secondary | ICD-10-CM

## 2020-03-12 NOTE — H&P (View-Only) (Signed)
  POST OPERATIVE OFFICE NOTE    CC:  F/u for surgery  HPI:  This is a 59 y.o. male who has hx of left RC AVF with subsequent branch ligation.  He also underwent fistulogram at [facility] but continued to have trouble with dialysis accessing the fistula.  He then underwent left 1st stage BVT on 02/01/2020 by Dr. Donnetta Hutching.    Pt states he does not have pain/numbness in his left hand.  He states he uses a cath dry on his catheter for showering or swimming and it works well.    The pt is on dialysis M/W/F via a TDC that was replaced by [facility] last week.  He states it is working well.    He dialyzes on M/W/F at [facility] in [place].     No Known Allergies  Current Outpatient Medications  Medication Sig Dispense Refill  . acetaminophen (TYLENOL) 500 MG tablet Take 500 mg by mouth every 6 (six) hours as needed (for pain.).    Marland Kitchen calcium acetate (PHOSLO) 667 MG capsule Take 2-4 capsules by mouth See admin instructions. Take 4 capsules with meals and 2 capsules with snacks.    Marland Kitchen oxyCODONE-acetaminophen (PERCOCET) 7.5-325 MG tablet Take 1 tablet by mouth every 4 (four) hours as needed for severe pain. 10 tablet 0  . torsemide (DEMADEX) 20 MG tablet Take 3 tablets (60 mg total) by mouth daily. (Patient taking differently: Take 60-80 mg by mouth daily as needed (fluid). ) 90 tablet 0   No current facility-administered medications for this visit.     ROS:  See HPI  Physical Exam:  Today's Vitals   03/12/20 1147  BP: (!) 149/78  Pulse: (!) 110  Resp: 18  SpO2: 98%  Weight: 245 lb 11.2 oz (111.4 kg)  Height: 5' 9.5" (1.765 m)  PainSc: 0-No pain   Body mass index is 35.76 kg/m.   Incision:  Well healed Extremities:  There is a faintly palpable left pulse.  Motor and sensory are in tact.  There is a thrill present.   Dialysis Duplex on 03/12/2020: Diameter:  0.65cm-0.85cm Depth:  0.64cm-1.58cm   Assessment/Plan:  This is a 59 y.o. male who has hx of left RC AVF with subsequent  branch ligation.  He also underwent fistulogram at [facility] but continued to have trouble with dialysis accessing the fistula.  He then underwent left 1st stage BVT on 02/01/2020 by Dr. Donnetta Hutching.  He is currently dialyzing via a TDC that was placed by IR on 12/29/2019    -the pt does not have evidence of steal. -the basilic vein is maturing nicely.  Discussed 2nd stage BVT with pt and he is ready to proceed.  Will schedule on a Thursday when Dr. Donnetta Hutching is available.  -If pt has tunneled dialysis catheter and the access has been used successfully to the satisfaction of the dialysis center, the tunneled catheter can be removed at their discretion.     Leontine Locket, [facility] [phone]  Clinic MD:  Early

## 2020-03-12 NOTE — Progress Notes (Signed)
  POST OPERATIVE OFFICE NOTE    CC:  F/u for surgery  HPI:  This is a 59 y.o. male who has hx of left RC AVF with subsequent branch ligation.  He also underwent fistulogram at [facility] but continued to have trouble with dialysis accessing the fistula.  He then underwent left 1st stage BVT on 02/01/2020 by Dr. Donnetta Hutching.    Pt states he does not have pain/numbness in his left hand.  He states he uses a cath dry on his catheter for showering or swimming and it works well.    The pt is on dialysis M/W/F via a TDC that was replaced by [facility] last week.  He states it is working well.    He dialyzes on M/W/F at [facility] in [place].     No Known Allergies  Current Outpatient Medications  Medication Sig Dispense Refill  . acetaminophen (TYLENOL) 500 MG tablet Take 500 mg by mouth every 6 (six) hours as needed (for pain.).    Marland Kitchen calcium acetate (PHOSLO) 667 MG capsule Take 2-4 capsules by mouth See admin instructions. Take 4 capsules with meals and 2 capsules with snacks.    Marland Kitchen oxyCODONE-acetaminophen (PERCOCET) 7.5-325 MG tablet Take 1 tablet by mouth every 4 (four) hours as needed for severe pain. 10 tablet 0  . torsemide (DEMADEX) 20 MG tablet Take 3 tablets (60 mg total) by mouth daily. (Patient taking differently: Take 60-80 mg by mouth daily as needed (fluid). ) 90 tablet 0   No current facility-administered medications for this visit.     ROS:  See HPI  Physical Exam:  Today's Vitals   03/12/20 1147  BP: (!) 149/78  Pulse: (!) 110  Resp: 18  SpO2: 98%  Weight: 245 lb 11.2 oz (111.4 kg)  Height: 5' 9.5" (1.765 m)  PainSc: 0-No pain   Body mass index is 35.76 kg/m.   Incision:  Well healed Extremities:  There is a faintly palpable left pulse.  Motor and sensory are in tact.  There is a thrill present.   Dialysis Duplex on 03/12/2020: Diameter:  0.65cm-0.85cm Depth:  0.64cm-1.58cm   Assessment/Plan:  This is a 59 y.o. male who has hx of left RC AVF with subsequent  branch ligation.  He also underwent fistulogram at [facility] but continued to have trouble with dialysis accessing the fistula.  He then underwent left 1st stage BVT on 02/01/2020 by Dr. Donnetta Hutching.  He is currently dialyzing via a TDC that was placed by IR on 12/29/2019    -the pt does not have evidence of steal. -the basilic vein is maturing nicely.  Discussed 2nd stage BVT with pt and he is ready to proceed.  Will schedule on a Thursday when Dr. Donnetta Hutching is available.  -If pt has tunneled dialysis catheter and the access has been used successfully to the satisfaction of the dialysis center, the tunneled catheter can be removed at their discretion.     Leontine Locket, [facility] [phone]  Clinic MD:  Early

## 2020-03-18 ENCOUNTER — Other Ambulatory Visit (HOSPITAL_COMMUNITY): Payer: Commercial Managed Care - PPO

## 2020-03-25 ENCOUNTER — Other Ambulatory Visit (HOSPITAL_COMMUNITY)
Admission: RE | Admit: 2020-03-25 | Discharge: 2020-03-25 | Disposition: A | Discharge status: Home / Self Care | Payer: Commercial Managed Care - PPO | Source: Ambulatory Visit | Attending: Vascular Surgery | Admitting: Vascular Surgery

## 2020-03-25 DIAGNOSIS — Z01812 Encounter for preprocedural laboratory examination: Secondary | ICD-10-CM | POA: Insufficient documentation

## 2020-03-25 DIAGNOSIS — Z20822 Contact with and (suspected) exposure to covid-19: Secondary | ICD-10-CM | POA: Insufficient documentation

## 2020-03-25 LAB — SARS CORONAVIRUS 2 (TAT 6-24 HRS): SARS Coronavirus 2: NEGATIVE

## 2020-03-27 ENCOUNTER — Other Ambulatory Visit: Payer: Self-pay

## 2020-03-27 ENCOUNTER — Encounter (HOSPITAL_COMMUNITY): Payer: Self-pay | Admitting: Vascular Surgery

## 2020-03-27 NOTE — Progress Notes (Signed)
Pt denies SOB and chest pain. Pt under the care of Bernerd Pho, PA,Cardiology.Pt stated that PCP is Arsenio Katz, NP. Pt denies having a stress test. Pt made aware to stop taking vitamins, fish oiland herbal medications. Do not take any NSAIDs ie: Ibuprofen, Advil, Naproxen (Aleve), Motrin, BC and Goody Powder. Pt stated that he does not take diabetes medications and " they check my sugar at dialysis." Pt reminded to quarantine. Pt verbalized understanding of all pre-op instructions.

## 2020-03-28 ENCOUNTER — Encounter (HOSPITAL_COMMUNITY)
Admission: RE | Disposition: A | Discharge status: Home / Self Care | Payer: Self-pay | Source: Home / Self Care | Attending: Vascular Surgery

## 2020-03-28 ENCOUNTER — Encounter (HOSPITAL_COMMUNITY): Payer: Self-pay | Admitting: Vascular Surgery

## 2020-03-28 ENCOUNTER — Ambulatory Visit (HOSPITAL_COMMUNITY)
Admission: RE | Admit: 2020-03-28 | Discharge: 2020-03-28 | Disposition: A | Discharge status: Home / Self Care | Payer: Commercial Managed Care - PPO | Attending: Vascular Surgery | Admitting: Vascular Surgery

## 2020-03-28 ENCOUNTER — Other Ambulatory Visit: Payer: Self-pay

## 2020-03-28 ENCOUNTER — Ambulatory Visit (HOSPITAL_COMMUNITY): Payer: Commercial Managed Care - PPO | Admitting: Certified Registered"

## 2020-03-28 DIAGNOSIS — I132 Hypertensive heart and chronic kidney disease with heart failure and with stage 5 chronic kidney disease, or end stage renal disease: Secondary | ICD-10-CM | POA: Insufficient documentation

## 2020-03-28 DIAGNOSIS — Z992 Dependence on renal dialysis: Secondary | ICD-10-CM | POA: Insufficient documentation

## 2020-03-28 DIAGNOSIS — E1122 Type 2 diabetes mellitus with diabetic chronic kidney disease: Secondary | ICD-10-CM | POA: Insufficient documentation

## 2020-03-28 DIAGNOSIS — I2510 Atherosclerotic heart disease of native coronary artery without angina pectoris: Secondary | ICD-10-CM | POA: Diagnosis not present

## 2020-03-28 DIAGNOSIS — Z452 Encounter for adjustment and management of vascular access device: Secondary | ICD-10-CM | POA: Diagnosis present

## 2020-03-28 DIAGNOSIS — N185 Chronic kidney disease, stage 5: Secondary | ICD-10-CM

## 2020-03-28 DIAGNOSIS — I252 Old myocardial infarction: Secondary | ICD-10-CM | POA: Insufficient documentation

## 2020-03-28 DIAGNOSIS — N186 End stage renal disease: Secondary | ICD-10-CM | POA: Diagnosis not present

## 2020-03-28 DIAGNOSIS — I509 Heart failure, unspecified: Secondary | ICD-10-CM | POA: Diagnosis not present

## 2020-03-28 HISTORY — PX: BASCILIC VEIN TRANSPOSITION: SHX5742

## 2020-03-28 LAB — GLUCOSE, CAPILLARY
Glucose-Capillary: 132 mg/dL — ABNORMAL HIGH (ref 70–99)
Glucose-Capillary: 148 mg/dL — ABNORMAL HIGH (ref 70–99)

## 2020-03-28 SURGERY — TRANSPOSITION, VEIN, BASILIC
Anesthesia: General | Site: Arm Upper | Laterality: Left

## 2020-03-28 MED ORDER — ACETAMINOPHEN 160 MG/5ML PO SOLN: 1000.0000 mg | Freq: Once | ORAL | Status: DC | PRN

## 2020-03-28 MED ORDER — SODIUM CHLORIDE 0.9 % IV SOLN
INTRAVENOUS | Status: DC | PRN
  Administered 2020-03-28: 500 mL

## 2020-03-28 MED ORDER — CHLORHEXIDINE GLUCONATE 0.12 % MT SOLN
OROMUCOSAL | Status: AC
  Administered 2020-03-28: 15 mL via OROMUCOSAL
  Filled 2020-03-28: qty 15

## 2020-03-28 MED ORDER — PHENYLEPHRINE HCL-NACL 10-0.9 MG/250ML-% IV SOLN
INTRAVENOUS | Status: DC | PRN
  Administered 2020-03-28: 50 ug/min via INTRAVENOUS

## 2020-03-28 MED ORDER — LIDOCAINE-EPINEPHRINE 0.5 %-1:200000 IJ SOLN
INTRAMUSCULAR | Status: AC
  Filled 2020-03-28: qty 1

## 2020-03-28 MED ORDER — METOPROLOL TARTRATE 5 MG/5ML IV SOLN
INTRAVENOUS | Status: DC | PRN
Start: 2020-03-28 — End: 2020-03-28
  Administered 2020-03-28 (×2): 1 mg via INTRAVENOUS

## 2020-03-28 MED ORDER — LIDOCAINE 2% (20 MG/ML) 5 ML SYRINGE
INTRAMUSCULAR | Status: AC
  Filled 2020-03-28: qty 5

## 2020-03-28 MED ORDER — 0.9 % SODIUM CHLORIDE (POUR BTL) OPTIME
TOPICAL | Status: DC | PRN
  Administered 2020-03-28: 1000 mL

## 2020-03-28 MED ORDER — ACETAMINOPHEN 500 MG PO TABS: 1000.0000 mg | ORAL_TABLET | Freq: Once | ORAL | Status: DC | PRN

## 2020-03-28 MED ORDER — MIDAZOLAM HCL 2 MG/2ML IJ SOLN
INTRAMUSCULAR | Status: AC
  Filled 2020-03-28: qty 2

## 2020-03-28 MED ORDER — CHLORHEXIDINE GLUCONATE 4 % EX LIQD: 60.0000 mL | Freq: Once | CUTANEOUS | Status: DC

## 2020-03-28 MED ORDER — FENTANYL CITRATE (PF) 100 MCG/2ML IJ SOLN
INTRAMUSCULAR | Status: DC | PRN
  Administered 2020-03-28 (×4): 50 ug via INTRAVENOUS
  Administered 2020-03-28 (×2): 25 ug via INTRAVENOUS

## 2020-03-28 MED ORDER — FENTANYL CITRATE (PF) 250 MCG/5ML IJ SOLN
INTRAMUSCULAR | Status: AC
  Filled 2020-03-28: qty 5

## 2020-03-28 MED ORDER — PHENYLEPHRINE 40 MCG/ML (10ML) SYRINGE FOR IV PUSH (FOR BLOOD PRESSURE SUPPORT)
PREFILLED_SYRINGE | INTRAVENOUS | Status: AC
  Filled 2020-03-28: qty 10

## 2020-03-28 MED ORDER — SODIUM CHLORIDE 0.9 % IV SOLN: INTRAVENOUS | Status: DC

## 2020-03-28 MED ORDER — ETOMIDATE 2 MG/ML IV SOLN
INTRAVENOUS | Status: AC
  Filled 2020-03-28: qty 10

## 2020-03-28 MED ORDER — METOPROLOL TARTRATE 5 MG/5ML IV SOLN
INTRAVENOUS | Status: AC
  Filled 2020-03-28: qty 5

## 2020-03-28 MED ORDER — DEXAMETHASONE SODIUM PHOSPHATE 10 MG/ML IJ SOLN
INTRAMUSCULAR | Status: AC
  Filled 2020-03-28: qty 1

## 2020-03-28 MED ORDER — OXYCODONE HCL 5 MG PO TABS: 5.0000 mg | ORAL_TABLET | Freq: Once | ORAL | Status: DC | PRN

## 2020-03-28 MED ORDER — MIDAZOLAM HCL 2 MG/2ML IJ SOLN
INTRAMUSCULAR | Status: DC | PRN
  Administered 2020-03-28 (×2): 1 mg via INTRAVENOUS

## 2020-03-28 MED ORDER — SODIUM CHLORIDE 0.9 % IV SOLN: INTRAVENOUS | Status: DC | PRN

## 2020-03-28 MED ORDER — ROCURONIUM BROMIDE 10 MG/ML (PF) SYRINGE
PREFILLED_SYRINGE | INTRAVENOUS | Status: DC | PRN
  Administered 2020-03-28: 60 mg via INTRAVENOUS
  Administered 2020-03-28: 10 mg via INTRAVENOUS

## 2020-03-28 MED ORDER — PROPOFOL 10 MG/ML IV BOLUS
INTRAVENOUS | Status: AC
  Filled 2020-03-28: qty 20

## 2020-03-28 MED ORDER — LIDOCAINE 2% (20 MG/ML) 5 ML SYRINGE
INTRAMUSCULAR | Status: DC | PRN
  Administered 2020-03-28: 60 mg via INTRAVENOUS

## 2020-03-28 MED ORDER — CHLORHEXIDINE GLUCONATE 0.12 % MT SOLN: 15.0000 mL | Freq: Once | OROMUCOSAL | Status: AC

## 2020-03-28 MED ORDER — OXYCODONE-ACETAMINOPHEN 5-325 MG PO TABS: 1.0000 | ORAL_TABLET | Freq: Four times a day (QID) | ORAL | 0 refills | Status: AC | PRN

## 2020-03-28 MED ORDER — OXYCODONE HCL 5 MG/5ML PO SOLN: 5.0000 mg | Freq: Once | ORAL | Status: DC | PRN

## 2020-03-28 MED ORDER — ONDANSETRON HCL 4 MG/2ML IJ SOLN
4.0000 mg | Freq: Once | INTRAMUSCULAR | Status: AC
  Administered 2020-03-28: 4 mg via INTRAVENOUS

## 2020-03-28 MED ORDER — SUGAMMADEX SODIUM 200 MG/2ML IV SOLN
INTRAVENOUS | Status: DC | PRN
  Administered 2020-03-28: 200 mg via INTRAVENOUS

## 2020-03-28 MED ORDER — DEXAMETHASONE SODIUM PHOSPHATE 10 MG/ML IJ SOLN
INTRAMUSCULAR | Status: DC | PRN
  Administered 2020-03-28: 4 mg via INTRAVENOUS

## 2020-03-28 MED ORDER — ACETAMINOPHEN 10 MG/ML IV SOLN: 1000.0000 mg | Freq: Once | INTRAVENOUS | Status: DC | PRN

## 2020-03-28 MED ORDER — ORAL CARE MOUTH RINSE: 15.0000 mL | Freq: Once | OROMUCOSAL | Status: AC

## 2020-03-28 MED ORDER — FENTANYL CITRATE (PF) 100 MCG/2ML IJ SOLN: 25.0000 ug | INTRAMUSCULAR | Status: DC | PRN

## 2020-03-28 MED ORDER — SODIUM CHLORIDE 0.9 % IV SOLN
INTRAVENOUS | Status: AC
  Filled 2020-03-28: qty 1.2

## 2020-03-28 MED ORDER — LACTATED RINGERS IV SOLN: INTRAVENOUS | Status: DC

## 2020-03-28 MED ORDER — ONDANSETRON HCL 4 MG/2ML IJ SOLN
INTRAMUSCULAR | Status: AC
  Filled 2020-03-28: qty 2

## 2020-03-28 MED ORDER — PHENYLEPHRINE 40 MCG/ML (10ML) SYRINGE FOR IV PUSH (FOR BLOOD PRESSURE SUPPORT)
PREFILLED_SYRINGE | INTRAVENOUS | Status: DC | PRN
  Administered 2020-03-28: 40 ug via INTRAVENOUS

## 2020-03-28 MED ORDER — ONDANSETRON HCL 4 MG/2ML IJ SOLN
INTRAMUSCULAR | Status: DC | PRN
  Administered 2020-03-28: 4 mg via INTRAVENOUS

## 2020-03-28 MED ORDER — ALBUMIN HUMAN 5 % IV SOLN
INTRAVENOUS | Status: DC | PRN
Start: 2020-03-28 — End: 2020-03-28

## 2020-03-28 MED ORDER — ETOMIDATE 2 MG/ML IV SOLN
INTRAVENOUS | Status: DC | PRN
  Administered 2020-03-28 (×2): 4 mg via INTRAVENOUS
  Administered 2020-03-28: 12 mg via INTRAVENOUS

## 2020-03-28 MED ORDER — CEFAZOLIN SODIUM-DEXTROSE 2-4 GM/100ML-% IV SOLN
2.0000 g | INTRAVENOUS | Status: AC
  Administered 2020-03-28: 2 g via INTRAVENOUS
  Filled 2020-03-28: qty 100

## 2020-03-28 SURGICAL SUPPLY — 33 items
ARMBAND PINK RESTRICT EXTREMIT (MISCELLANEOUS) ×3 IMPLANT
CANISTER SUCT 3000ML PPV (MISCELLANEOUS) ×3 IMPLANT
CANNULA VESSEL 3MM 2 BLNT TIP (CANNULA) ×3 IMPLANT
CLIP LIGATING EXTRA MED SLVR (CLIP) ×3 IMPLANT
CLIP LIGATING EXTRA SM BLUE (MISCELLANEOUS) ×3 IMPLANT
COVER PROBE W GEL 5X96 (DRAPES) ×3 IMPLANT
COVER WAND RF STERILE (DRAPES) ×3 IMPLANT
DECANTER SPIKE VIAL GLASS SM (MISCELLANEOUS) ×3 IMPLANT
DERMABOND ADVANCED (GAUZE/BANDAGES/DRESSINGS) ×2
DERMABOND ADVANCED .7 DNX12 (GAUZE/BANDAGES/DRESSINGS) ×1 IMPLANT
ELECT REM PT RETURN 9FT ADLT (ELECTROSURGICAL) ×3
ELECTRODE REM PT RTRN 9FT ADLT (ELECTROSURGICAL) ×1 IMPLANT
GLOVE BIOGEL M 7.0 STRL (GLOVE) ×6 IMPLANT
GLOVE ECLIPSE 7.5 STRL STRAW (GLOVE) ×9 IMPLANT
GLOVE SS BIOGEL STRL SZ 7.5 (GLOVE) ×1 IMPLANT
GLOVE SUPERSENSE BIOGEL SZ 7.5 (GLOVE) ×2
GOWN STRL REUS W/ TWL LRG LVL3 (GOWN DISPOSABLE) ×3 IMPLANT
GOWN STRL REUS W/TWL LRG LVL3 (GOWN DISPOSABLE) ×6
KIT BASIN OR (CUSTOM PROCEDURE TRAY) ×3 IMPLANT
KIT TURNOVER KIT B (KITS) ×3 IMPLANT
NS IRRIG 1000ML POUR BTL (IV SOLUTION) ×3 IMPLANT
PACK CV ACCESS (CUSTOM PROCEDURE TRAY) ×3 IMPLANT
PAD ARMBOARD 7.5X6 YLW CONV (MISCELLANEOUS) ×6 IMPLANT
SPONGE LAP 18X18 RF (DISPOSABLE) ×3 IMPLANT
SUT PROLENE 6 0 CC (SUTURE) ×3 IMPLANT
SUT SILK 2 0 SH (SUTURE) IMPLANT
SUT SILK 3 0 (SUTURE) ×2
SUT SILK 3-0 18XBRD TIE 12 (SUTURE) ×1 IMPLANT
SUT VIC AB 3-0 SH 27 (SUTURE) ×6
SUT VIC AB 3-0 SH 27X BRD (SUTURE) ×3 IMPLANT
TOWEL GREEN STERILE (TOWEL DISPOSABLE) ×3 IMPLANT
UNDERPAD 30X36 HEAVY ABSORB (UNDERPADS AND DIAPERS) ×3 IMPLANT
WATER STERILE IRR 1000ML POUR (IV SOLUTION) ×3 IMPLANT

## 2020-03-28 NOTE — Transfer of Care (Signed)
Immediate Anesthesia Transfer of Care Note  Patient: Wesley [name]  Procedure(s) Performed: SECOND STAGE BASILIC VEIN TRANSPOSITION (Left Arm Upper)  Patient Location: PACU  Anesthesia Type:General  Level of Consciousness: awake, oriented and patient cooperative  Airway & Oxygen Therapy: Patient Spontanous Breathing and Patient connected to nasal cannula oxygen  Post-op Assessment: Report given to RN and Post -op Vital signs reviewed and stable  Post vital signs: Reviewed and stable  Last Vitals:  Vitals Value Taken Time  BP 117/85 03/28/20 0941  Temp    Pulse 109 03/28/20 0941  Resp 22 03/28/20 0941  SpO2 96 % 03/28/20 0941  Vitals shown include unvalidated device data.  Last Pain:  Vitals:   03/28/20 0639  TempSrc:   PainSc: 0-No pain         Complications: No complications documented.

## 2020-03-28 NOTE — Op Note (Signed)
    OPERATIVE REPORT  DATE OF SURGERY: 03/28/2020  PATIENT: Wesley [name], 59 y.o. male MRN: [id number]  DOB: 12-21-1960  PRE-OPERATIVE DIAGNOSIS: End-stage renal disease  POST-OPERATIVE DIAGNOSIS:  Same  PROCEDURE: Second stage basilic vein transposition left arm  SURGEON:  Curt Jews, M.D.  PHYSICIAN ASSISTANT: Liana Crocker, PA-C  ANESTHESIA: General  EBL: per anesthesia record  Total I/O In: 850 [I.V.:500; IV Piggyback:350] Out: -   BLOOD ADMINISTERED: none  DRAINS: none  SPECIMEN: none  COUNTS CORRECT:  YES  PATIENT DISPOSITION:  PACU - hemodynamically stable  PROCEDURE DETAILS: The patient was taken to room placed supine position where the area of the left arm prepped draped in sterile fashion SonoSite was used to mark the level of the basilic vein which was of excellent caliber from the antecubital anastomosis to the axilla.  Incision was made over the prior brachial artery to basilic vein anastomosis and the vein was encircled.  Separate incision was made in the mid upper arm and then at the axilla.  Tributary branches were ligated with 3-0 silk ties and divided.  The vein was occluded near the brachial artery anastomosis.  The vein was transected and the vein was removed from its tunnel.  The vein was marked to prevent twisting.  A tunnel was created from the antecubital space to the ax axillary incision and the vein was brought through the subcutaneous tunnel.  The vein was sewn into into the old anastomosis with a running 6-0 Prolene suture.  Clamps removed and excellent thrill was noted.  The wounds irrigated with saline.  Hemostasis electrocautery.  Wounds were closed with 3-0 Vicryl in the subcutaneous and subcuticular tissue.  Sterile dressing was applied the patient was transferred to the recovery in stable condition   Wesley [name], M.D., [facility] 03/28/2020 10:22 AM

## 2020-03-28 NOTE — Anesthesia Preprocedure Evaluation (Addendum)
Anesthesia Evaluation  Patient identified by MRN, date of birth, ID band Patient awake    Reviewed: Allergy & Precautions, NPO status , Patient's Chart, lab work & pertinent test results  History of Anesthesia Complications (+) history of anesthetic complications  Airway Mallampati: II  TM Distance: >3 FB Neck ROM: Full    Dental  (+) Dental Advisory Given, Poor Dentition,    Pulmonary neg recent URI,    breath sounds clear to auscultation       Cardiovascular hypertension, + CAD, + Past MI and +CHF  (-) dysrhythmias  Rhythm:Regular   Tte:  - Left ventricle: The cavity size was normal. Wall thickness was  increased in a pattern of moderate LVH. Systolic function was  severely reduced. The estimated ejection fraction was in the  range of 20% to 25%. Diffuse hypokinesis. The study is not  technically sufficient to allow evaluation of LV diastolic  function.  - Aortic valve: Mildly calcified annulus. Mildly thickened  leaflets. Valve area (VTI): 3.22 cm^2. Valve area (Vmax): 3.53  cm^2.  - Mitral valve: Mildly calcified annulus. Normal thickness leaflets  .  - Left atrium: The atrium was moderately dilated.  - Right ventricle: The cavity size was mildly dilated. Systolic  function was mildly reduced.  - Right atrium: The atrium was mildly dilated.  - Technically difficult study, echocontrast was used to enhance  visualization.    2019 cath:   Ost 1st Diag to 1st Diag lesion is 80% stenosed.  Prox LAD to Mid LAD lesion is 70% stenosed.  1st Mrg lesion is 70% stenosed.  Mid RCA lesion is 50% stenosed.  Mid RCA to Dist RCA lesion is 80% stenosed.  Dist RCA lesion is 100% stenosed. Left to right collaterals.  LV end diastolic pressure is mildly elevated.  There is no aortic valve stenosis.  Hemodynamic findings consistent with moderate pulmonary hypertension.  PA 53/28, mean PA 38 mm Hg; PCWP  mean 28 mm Hg; CO 6.2 L/min; CI 2.7. PA sat 58%.   Significant CAD.  No anginal sx.  No critical lesions noted.  Would continue medical therapy for decreased LVEF.  Compliance will have to be shown with his meds.  He is currently not taking his metoprolol per his preference.  After AV fistula creation, could reconsider cath and then committing him to DAPT if PCI needed.       Neuro/Psych negative neurological ROS  negative psych ROS   GI/Hepatic negative GI ROS, Neg liver ROS,   Endo/Other  diabetes  Renal/GU ESRF and DialysisRenal disease     Musculoskeletal   Abdominal   Peds  Hematology  (+) anemia ,   Anesthesia Other Findings   Reproductive/Obstetrics                           Anesthesia Physical Anesthesia Plan  ASA: III  Anesthesia Plan: General   Post-op Pain Management:    Induction: Intravenous  PONV Risk Score and Plan: 2 and Ondansetron and Dexamethasone  Airway Management Planned: LMA and Oral ETT  Additional Equipment: None  Intra-op Plan:   Post-operative Plan: Extubation in OR  Informed Consent: I have reviewed the patients History and Physical, chart, labs and discussed the procedure including the risks, benefits and alternatives for the proposed anesthesia with the patient or authorized representative who has indicated his/her understanding and acceptance.     Dental advisory given  Plan Discussed with: CRNA and Surgeon  Anesthesia  Plan Comments:         Anesthesia Quick Evaluation

## 2020-03-28 NOTE — Progress Notes (Signed)
Spoke with Dr. Donnetta Hutching.  Order received to add LEFT to surgical consent.

## 2020-03-28 NOTE — Interval H&P Note (Signed)
History and Physical Interval Note:  03/28/2020 7:18 AM  Wesley [name]  has presented today for surgery, with the diagnosis of END STAGE RENAL DISEASE.  The various methods of treatment have been discussed with the patient and family. After consideration of risks, benefits and other options for treatment, the patient has consented to  Procedure(s): SECOND STAGE BASILIC VEIN TRANSPOSITION (Left) as a surgical intervention.  The patient's history has been reviewed, patient examined, no change in status, stable for surgery.  I have reviewed the patient's chart and labs.  Questions were answered to the patient's satisfaction.     Curt Jews

## 2020-03-28 NOTE — Anesthesia Procedure Notes (Addendum)
Procedure Name: Intubation Date/Time: 03/28/2020 7:42 AM Performed by: Orlie Dakin, CRNA Pre-anesthesia Checklist: Patient identified, Emergency Drugs available, Suction available and Patient being monitored Patient Re-evaluated:Patient Re-evaluated prior to induction Oxygen Delivery Method: Circle system utilized Preoxygenation: Pre-oxygenation with 100% oxygen Induction Type: IV induction Ventilation: Mask ventilation without difficulty Laryngoscope Size: Miller and 3 Grade View: Grade II Tube type: Oral Tube size: 7.5 mm Number of attempts: 1 Airway Equipment and Method: Stylet Placement Confirmation: ETT inserted through vocal cords under direct vision,  positive ETCO2 and breath sounds checked- equal and bilateral Secured at: 23 cm Tube secured with: Tape Dental Injury: Teeth and Oropharynx as per pre-operative assessment  Comments: Poor dentition noted with DL.  In Short-Stay "none loose" per patient.  4x4s bite block used on left side of mouth.

## 2020-03-28 NOTE — Discharge Instructions (Signed)
   [facility]  Discharge Instructions  AV Fistula or Graft Surgery for Dialysis Access  Please refer to the following instructions for your post-procedure care. Your surgeon or physician assistant will discuss any changes with you.  Activity  You may drive the day following your surgery, if you are comfortable and no longer taking prescription pain medication. Resume full activity as the soreness in your incision resolves.  Bathing/Showering  You may shower after you go home. Keep your incision dry for 48 hours. Do not soak in a bathtub, hot tub, or swim until the incision heals completely. You may not shower if you have a hemodialysis catheter.  Incision Care  Clean your incision with mild soap and water after 48 hours. Pat the area dry with a clean towel. You do not need a bandage unless otherwise instructed. Do not apply any ointments or creams to your incision. You may have skin glue on your incision. Do not peel it off. It will come off on its own in about one week. Your arm may swell a bit after surgery. To reduce swelling use pillows to elevate your arm so it is above your heart. Your doctor will tell you if you need to lightly wrap your arm with an ACE bandage.  Diet  Resume your normal diet. There are not special food restrictions following this procedure. In order to heal from your surgery, it is CRITICAL to get adequate nutrition. Your body requires vitamins, minerals, and protein. Vegetables are the best source of vitamins and minerals. Vegetables also provide the perfect balance of protein. Processed food has little nutritional value, so try to avoid this.  Medications  Resume taking all of your medications. If your incision is causing pain, you may take over-the counter pain relievers such as acetaminophen (Tylenol). If you were prescribed a stronger pain medication, please be aware these medications can cause nausea and constipation. Prevent  nausea by taking the medication with a snack or meal. Avoid constipation by drinking plenty of fluids and eating foods with high amount of fiber, such as fruits, vegetables, and grains.  Do not take Tylenol if you are taking prescription pain medications.  Follow up Your surgeon may want to see you in the office following your access surgery. If so, this will be arranged at the time of your surgery.  Please call us immediately for any of the following conditions:  . Increased pain, redness, drainage (pus) from your incision site . Fever of 101 degrees or higher . Severe or worsening pain at your incision site . Hand pain or numbness. .  Reduce your risk of vascular disease:  . Stop smoking. If you would like help, call [facility] at 1-800-QUIT-NOW 740-516-8749) or [place] at 734-477-7592  . Manage your cholesterol . Maintain a desired weight . Control your diabetes . Keep your blood pressure down  Dialysis  It will take several weeks to several months for your new dialysis access to be ready for use. Your surgeon will determine when it is okay to use it. Your nephrologist will continue to direct your dialysis. You can continue to use your Permcath until your new access is ready for use.   03/28/2020 Wesley [name] [id number] 1961/04/19  Surgeon(s): Early, Arvilla Meres, MD  Procedure(s): SECOND STAGE BASILIC VEIN TRANSPOSITION  x Do not stick fistula for 6 weeks    If you have any questions, please call the office at [phone].

## 2020-03-29 ENCOUNTER — Encounter (HOSPITAL_COMMUNITY): Payer: Self-pay | Admitting: Vascular Surgery

## 2020-04-04 NOTE — Anesthesia Postprocedure Evaluation (Signed)
Anesthesia Post Note  Patient: Wesley [name]  Procedure(s) Performed: SECOND STAGE BASILIC VEIN TRANSPOSITION (Left Arm Upper)     Patient location during evaluation: PACU Anesthesia Type: General Level of consciousness: awake and alert Pain management: pain level controlled Vital Signs Assessment: post-procedure vital signs reviewed and stable Respiratory status: spontaneous breathing, nonlabored ventilation, respiratory function stable and patient connected to nasal cannula oxygen Cardiovascular status: blood pressure returned to baseline and stable Postop Assessment: no apparent nausea or vomiting Anesthetic complications: no   No complications documented.  Last Vitals:  Vitals:   03/28/20 1024 03/28/20 1025  BP: 118/79   Pulse: 89 (!) 103  Resp: 13 14  Temp: (!) 36.3 C   SpO2: 93% 90%    Last Pain:  Vitals:   03/28/20 1011  TempSrc:   PainSc: 4                  Tifini Reeder

## 2020-04-23 ENCOUNTER — Other Ambulatory Visit: Payer: Self-pay

## 2020-04-23 ENCOUNTER — Ambulatory Visit (INDEPENDENT_AMBULATORY_CARE_PROVIDER_SITE_OTHER): Payer: Self-pay | Admitting: Physician Assistant

## 2020-04-23 VITALS — BP 150/91 | HR 114 | Temp 97.8°F | Resp 20 | Ht 69.5 in | Wt 249.8 lb

## 2020-04-23 DIAGNOSIS — N186 End stage renal disease: Secondary | ICD-10-CM

## 2020-04-23 DIAGNOSIS — Z992 Dependence on renal dialysis: Secondary | ICD-10-CM

## 2020-04-23 NOTE — Progress Notes (Signed)
  POST OPERATIVE DIALYSIS ACCESS OFFICE NOTE    CC:  F/u for dialysis access surgery  HPI:  This is a 58 y.o. male who is s/p second stage basilic vein transposition left arm 03/28/2020.  He complains of some mild incisional pain.  No hand pain.  No fever or chills.  Dialyzing via right IJ tunneled dialysis catheter without complications  Dialysis days:  MWF   Dialysis center:  [facility]  No Known Allergies  Current Outpatient Medications  Medication Sig Dispense Refill  . acetaminophen (TYLENOL) 500 MG tablet Take 1,000 mg by mouth every 6 (six) hours as needed for moderate pain or headache.    . calcium acetate (PHOSLO) 667 MG capsule Take 2-4 capsules by mouth See admin instructions. Take 2668 mg with each meal and 1334 mg with each snack    . torsemide (DEMADEX) 20 MG tablet Take 3 tablets (60 mg total) by mouth daily. (Patient taking differently: Take 60-80 mg by mouth See admin instructions. Take 60 mg 3 times a day or 80 mg twice daily, total 6 tabs per 24 hours) 90 tablet 0  . oxyCODONE-acetaminophen (PERCOCET) 5-325 MG tablet Take 1 tablet by mouth every 6 (six) hours as needed. (Patient not taking: Reported on 04/23/2020) 20 tablet 0   No current facility-administered medications for this visit.     ROS:  See HPI  BP (!) 150/91 (BP Location: Right Arm, Patient Position: Sitting, Cuff Size: Normal)   Pulse (!) 114   Temp 97.8 F (36.6 C) (Temporal)   Resp 20   Ht 5' 9.5" (1.765 m)   Wt (!) 249 lb 12.8 oz (113.3 kg)   SpO2 97%   BMI 36.36 kg/m    Physical Exam:  General appearance: Alert and oriented x4.  In no apparent distress Cardiac: Rate and rhythm are regular Respiratory: Nonlabored Incision: Upper arm incisions are well approximated.  He has mild edema.  No drainage or signs of infection.  His fistula is palpable along its course with good bruit and thrill. Extremities: 2+ ulnar pulse.  Hand is warm and well-perfused.  5 out of 5 grip strength.  Motor  and sensation intact   Assessment/Plan:   -pt does not have evidence of steal syndrome -Still with mild edema and postoperative incisional pain -the fistula/graft may be used starting April 29, 2020 -If pt has tunneled dialysis catheter and the access has been used successfully to the satisfaction of the dialysis center, the tunneled catheter can be removed at their discretion.    Barbie Banner, PA-C 04/23/2020 8:44 AM [facility] [phone]  Clinic MD:  Carlis Abbott

## 2021-09-28 DEATH — deceased
